# Patient Record
Sex: Female | Born: 1940
Health system: Southern US, Community
[De-identification: ages and names within clinical notes are randomized; demographics above are authoritative.]

## PROBLEM LIST (undated history)

## (undated) DIAGNOSIS — M1990 Unspecified osteoarthritis, unspecified site: Secondary | ICD-10-CM

## (undated) DIAGNOSIS — G2581 Restless legs syndrome: Secondary | ICD-10-CM

## (undated) DIAGNOSIS — E119 Type 2 diabetes mellitus without complications: Secondary | ICD-10-CM

## (undated) DIAGNOSIS — K838 Other specified diseases of biliary tract: Secondary | ICD-10-CM

## (undated) DIAGNOSIS — I10 Essential (primary) hypertension: Secondary | ICD-10-CM

## (undated) DIAGNOSIS — E785 Hyperlipidemia, unspecified: Secondary | ICD-10-CM

## (undated) DIAGNOSIS — K5790 Diverticulosis of intestine, part unspecified, without perforation or abscess without bleeding: Secondary | ICD-10-CM

## (undated) DIAGNOSIS — E7800 Pure hypercholesterolemia, unspecified: Secondary | ICD-10-CM

## (undated) DIAGNOSIS — K219 Gastro-esophageal reflux disease without esophagitis: Secondary | ICD-10-CM

## (undated) HISTORY — DX: Type 2 diabetes mellitus without complications: E11.9

## (undated) HISTORY — DX: Essential (primary) hypertension: I10

## (undated) HISTORY — PX: OTHER SURGICAL HISTORY: SHX169

## (undated) HISTORY — PX: REPLACEMENT TOTAL KNEE: SUR1224

## (undated) HISTORY — DX: Other specified diseases of biliary tract: K83.8

## (undated) HISTORY — DX: Hyperlipidemia, unspecified: E78.5

## (undated) HISTORY — PX: CHOLECYSTECTOMY: SHX55

## (undated) HISTORY — DX: Pure hypercholesterolemia, unspecified: E78.00

---

## 1972-03-30 HISTORY — PX: ERCP W/ SPHICTEROTOMY: SHX1523

## 2003-06-14 ENCOUNTER — Encounter: Payer: Self-pay | Admitting: Orthopedic Surgery

## 2003-10-16 ENCOUNTER — Inpatient Hospital Stay (HOSPITAL_COMMUNITY): Admission: RE | Admit: 2003-10-16 | Discharge: 2003-10-20 | Payer: Self-pay | Admitting: Orthopedic Surgery

## 2003-10-16 ENCOUNTER — Encounter: Payer: Self-pay | Admitting: Orthopedic Surgery

## 2004-04-17 ENCOUNTER — Ambulatory Visit: Payer: Self-pay | Admitting: Orthopedic Surgery

## 2004-10-15 ENCOUNTER — Ambulatory Visit: Payer: Self-pay | Admitting: Orthopedic Surgery

## 2005-10-19 ENCOUNTER — Ambulatory Visit: Payer: Self-pay | Admitting: Orthopedic Surgery

## 2006-10-21 ENCOUNTER — Ambulatory Visit: Payer: Self-pay | Admitting: Orthopedic Surgery

## 2007-10-24 ENCOUNTER — Ambulatory Visit: Payer: Self-pay | Admitting: Orthopedic Surgery

## 2007-10-24 DIAGNOSIS — Z96652 Presence of left artificial knee joint: Secondary | ICD-10-CM | POA: Insufficient documentation

## 2007-10-24 DIAGNOSIS — M25569 Pain in unspecified knee: Secondary | ICD-10-CM | POA: Insufficient documentation

## 2009-11-06 ENCOUNTER — Ambulatory Visit: Payer: Self-pay | Admitting: Orthopedic Surgery

## 2010-04-30 NOTE — Assessment & Plan Note (Signed)
Summary: 2 YR XR RT TKA/MEDICARE/ST BCBS/BSF   Visit Type:  Follow-up  CC:  right knee TKA.  History of Present Illness: I saw Gloria Sanchez in the office today for a followup visit.  She is a 70 years old woman with the complaint of:  right knee.  Xrays today.  DOS 10-16-03. STRYKER SCORPIO  Patient will have an xray today.  aching at times when ther eis a weather change    Allergies: No Known Drug Allergies  Physical Exam  Extremities:  knee flexion arc 125 degrees  full extension    Impression & Recommendations:  Problem # 1:  TOTAL KNEE FOLLOW-UP (ICD-V43.65) Assessment Comment Only  3 xrays of the right knee   The  alignment was normal, PTF reduced without tilt or subluxation, no evidence of loosening.  IMPRESSION: normal appearance of the implant   stable doing well   Orders: Est. Patient Level III (11914) Knee x-ray,  3 views (78295)  Patient Instructions: 1)  Please schedule a follow-up appointment in 1 year. 2)  TKA Xrays

## 2010-07-31 DIAGNOSIS — M545 Low back pain, unspecified: Secondary | ICD-10-CM | POA: Insufficient documentation

## 2010-07-31 DIAGNOSIS — E119 Type 2 diabetes mellitus without complications: Secondary | ICD-10-CM | POA: Insufficient documentation

## 2010-08-15 NOTE — H&P (Signed)
NAME:  Gloria Sanchez, Gloria Sanchez                            ACCOUNT NO.:  [id number]   MEDICAL RECORD NO.:  [id number]                   PATIENT TYPE:  AMB   LOCATION:  DAY                                  FACILITY:  [facility]   PHYSICIAN:  Vickki Hearing, M.D.           DATE OF BIRTH:  1940-04-24   DATE OF ADMISSION:  10/16/2003  DATE OF DISCHARGE:                                HISTORY & PHYSICAL   CHIEF COMPLAINT:  Right knee pain.   HISTORY:  This is a 70 year old female with no known previous known injury,  presented with right knee pain for several years status post one year of  treatment at [facility] for lumbar spine arthritis and  sciatica. She had a MRI of the lumbar spine in June which showed bulging  disks at L3-4 with anterolisthesis of 4 on 5 and disk dehydration seen at L3-  4 and L4-5.   She has catching sensation and giving way with radiation of pain in the back  of the knee. She also pain in the thigh which goes down into the foot.   Her primary symptom, however, is knee pain, and her radiographs indicate  osteoarthritis of the knee. She improved with Neurontin 100 mg t.i.d. and  Sterapred dose pack in terms of her back pain; however, knee pain has  persisted, and a total knee replacement was recommended. The patient  understands that she may have some leg pain from her lumbar spine disease  even after this surgery.   REVIEW OF SYSTEMS:  Poor circulation, vomiting, diarrhea, numbness, unstable  gait, joint pain, arthritis, sinusitis, and tinnitus. All other systems are  normal.   PAST HISTORY:  Allergies none. Medical problems:  Low back pain, chronic  knee pain. Surgery:  Cholecystectomy, removal of kidney stone. Pharmacy [facility].   CURRENT MEDICATIONS:  Lisinopril and Premarin.   FAMILY HISTORY:  Family history of cancer.   FAMILY PHYSICIAN:  Dr. Neita Carp.   SOCIAL HISTORY:  She is married. She is retired on disability. She has no  smoking or alcohol  history. Caffeine use none. Grades completed 1 through  12.   PHYSICAL EXAMINATION:  VITAL SIGNS:  Weight 197, pulse 80, respiratory rate  20.  GENERAL APPEARANCE:  She is well developed, well nourished. Grooming and  hygiene are normal. She has slightly increased weight for height and no  deformity.  CARDIOVASCULAR:  Pulse and perfusion are normal. Extremity temperature  normal. No swelling, no edema, tenderness, or varicose vein.  LYMPH NODES:  Lymph nodes are normal in the cervical spine.  MUSCULOSKELETAL:  Exam of the musculoskeletal system shows abnormal gait  pattern with painful right knee gait. Tenderness is noted in the lumbar  spine. The straight leg raise is now improved from March of 2005. The right  knee shows crepitus on range of motion. She does have 125 degrees of flexion  with full extension. She  has medial joint line tenderness and pain but  negative meniscal signs. Her ligaments are stable. Muscle strength and tone  are normal.  SKIN:  Her skin was normal.  NEUROLOGIC AND PSYCHOLOGIC EXAM :  Showed some decrease sensation in the  dorsum of the right foot but normal reflexes and coordination and normal  mood.   DIAGNOSTIC DATA:  Radiographs show osteoarthritis of the knee.   PLAN:  Right total knee replacement.   Surgery scheduled for July 19 at [facility].     ___________________________________________                                         Vickki Hearing, M.D.   SEH/MEDQ  D:  10/16/2003  T:  10/16/2003  Job:  [id number]

## 2010-08-15 NOTE — Op Note (Signed)
NAME:  Gloria Sanchez, Gloria Sanchez                            ACCOUNT NO.:  [id number]   MEDICAL RECORD NO.:  [id number]                   PATIENT TYPE:  AMB   LOCATION:  DAY                                  FACILITY:  [facility]   PHYSICIAN:  Vickki Hearing, M.D.           DATE OF BIRTH:  1940-05-24   DATE OF PROCEDURE:  10/16/2003  DATE OF DISCHARGE:                                 OPERATIVE REPORT   PREOPERATIVE DIAGNOSIS:  Osteoarthritis, right knee.   POSTOPERATIVE DIAGNOSIS:  Osteoarthritis, right knee.   PROCEDURE:  Right total knee replacement.   COMPONENTS:  Stryker Scorpio posterior stabilized knee, size 7 femur, size 7  tibia, size 7 patella, 12-mm polyethylene insert.   SURGEON:  Vickki Hearing, M.D.   ANESTHESIA:  Spinal.   FINDINGS:  Osteoarthritis mainly medial in patellofemoral compartment.  Several loose bodies in posterior compartment and suprapatellar femoral  compartment.   INDICATIONS FOR PROCEDURE:  The patient is a 70 year old female with  persistent right knee pain.  Also had some back pain.  Was worked up for  that and found to have degenerative disk disease.  Was treated with  Neurontin and Sterapred dose pack which relieved her back and radicular  pain.  She persisted with knee pain and presented for total knee  replacement.  Primary indication was pain.   DESCRIPTION OF PROCEDURE:  Gloria Sanchez was identified in the preoperative  holding area.  Her chart and consent form were reviewed.  She placed a mark  over her right knee as the surgical site, and I signed my initials in the  same site.  She was given Ancef and taken to the operating room for spinal  anesthetic.  She was placed supine, and a sterile prep and drape was done  with DuraPrep.  At that point a timeout was taken.  Everyone agreed with the  procedure.  She had had her antibiotics, and the right lower extremity was  the surgical site.   The leg was exsanguinated with an Engineer, site.  The  tourniquet was  inflated to 300 mmHg where it stayed for 1 hour and 31 minutes.  An incision  was made over the right knee.  The dissection was carried down to the  extensor mechanism, and a medial arthrotomy was performed.  A medial soft  tissue sleeve was elevated.  The patella was everted.  ACL, PCL, and medial  and lateral meniscal anterior horns were resected.  The tibia was subluxated  forward.  A tibial guide was set in neutral position with a 5-degree  posterior slope pinned, and a 10-mm resection was made measuring from the  lateral compartment.  The remaining posterior horns of the medial and  lateral menisci were resected, and the remaining cruciate stumps were  removed.   A three-eighths inch drill bit was inserted into the femoral canal.  The  canal was decompressed by suction  and fluted guide rod, and the 10-mm distal  cutting block was placed.  The 10-mm resection was made.  The extension gap  was checked and found to be tight, and a 2-mm additional cut was made.  This  opened up the flexion gap.   The knee was flexed.  The knee measured a size 7.  The 7 cutting block was  pinned in slight external rotation, and the four cuts were made.  Posterior  bone was removed from both medial and lateral posterior compartments.  We  then made the box cut and set the trials for size 7 femur and tibia, with a  10-mm insert.  At that point, the flexion gap was checked and found to be  good as well.  The medial collateral ligament deep portion was released to  balance the knee in the coronal plane.   The patella was resected from a 26 to a 12.  A size 7 lollipop was placed,  and three drill holes were made through the lollipop.   The knee was irrigated.  The components were cemented in place.  Excess  cement was removed.  A size 10 and 12 trial were placed, and a 12 fit best.  No lateral release was needed.   The knee was irrigated.  Excess cement was removed.  A Hemovac drain was   placed in the deep space.  The extensor mechanism was closed with #1 Bralon.  A pain pump catheter was placed in the subcutaneous space, and the  subcutaneous tissue was closed with 0 and 2-0 Vicryl.  The skin was closed  with staples.  A sterile bandage was placed.  A CryoCuff was placed.  The  tourniquet was released, and the patient was taken to the recovery room in  stable condition.   POSTOPERATIVE PLAN:  Routine postoperative total knee protocol.      ___________________________________________                                            Vickki Hearing, M.D.   SEH/MEDQ  D:  10/16/2003  T:  10/16/2003  Job:  [id number]

## 2010-08-15 NOTE — Discharge Summary (Signed)
NAME:  [name], [name]                            ACCOUNT NO.:  [id number]   MEDICAL RECORD NO.:  [id number]                   PATIENT TYPE:  INP   LOCATION:  A331                                 FACILITY:  [facility]   PHYSICIAN:  Vickki Hearing, M.D.           DATE OF BIRTH:  11/28/1940   DATE OF ADMISSION:  10/16/2003  DATE OF DISCHARGE:  10/20/2003                                 DISCHARGE SUMMARY   ADMISSION DIAGNOSIS:  Osteoarthritis, right knee.   DISCHARGE DIAGNOSIS:  Osteoarthritis, right knee.   PROCEDURE:  Right total knee replacement.   SURGEON:  Vickki Hearing, M.D.   COMPLICATIONS:  None.   HISTORY:  Gloria Sanchez is 70 years old, presented for a right total knee  replacement due to right knee osteoarthritis.  Medical problems included low  back pain and chronic knee pain.  She had a cholecystectomy in the past, as  well as removal of a kidney stone.  Otherwise, he health was known to be  good.    On the day of admission, she underwent an uncomplicated right total knee  replacement using a Striker Scorpio total knee system.  We used a size 7  patella, size 7 12 mm tibia, a standard size 7 tibial tray, 7 right  posterior stabilized femoral component with cement.  She did well under  spinal anesthetic with no complications.   HOSPITAL COURSE:  She had an uncomplicated hospital course, progressed well  with physical therapy, did well in terms of pain.  At the time of discharge,  she had proximally range of motion passively 0 to 90, ambulated about 20 to  30 feet, and was allowed to be discharged to home.   CONDITION ON DISCHARGE:  Overall condition was improved.   DISCHARGE MEDICATIONS:  1. Vicodin 5 mg one q.4h. p.r.n.  2. Lovenox 30 mg subcutaneously q.12h. for the balance of 28 days.  3. __________ one b.i.d.   FOLLOWUP:  Follow up visit on October 25, 2003, with Dr. Romeo Apple.     ___________________________________________       Vickki Hearing, M.D.   SEH/MEDQ  D:  10/30/2003  T:  10/30/2003  Job:  [id number]

## 2010-08-15 NOTE — H&P (Signed)
NAME:  Gloria Sanchez, Gloria Sanchez NO.:  [id number]   MEDICAL RECORD NO.:  [id number]                   PATIENT TYPE:  INP   LOCATION:  A331                                 FACILITY:  [facility]   PHYSICIAN:  Vania Rea, M.D.              DATE OF BIRTH:  1940/08/09   DATE OF ADMISSION:  10/16/2003  DATE OF DISCHARGE:                                HISTORY & PHYSICAL   No dictation for this job.     ___________________________________________                                         Vania Rea, M.D.   LC/MEDQ  D:  10/16/2003  T:  10/16/2003  Job:  [id number]

## 2010-08-15 NOTE — Consult Note (Signed)
NAME:  Gloria Sanchez, STINGER NO.:  [id number]   MEDICAL RECORD NO.:  [id number]                   PATIENT TYPE:  INP   LOCATION:  A331                                 FACILITY:  [facility]   PHYSICIAN:  Vania Rea, M.D.              DATE OF BIRTH:  [birth year]   DATE OF CONSULTATION:  10/16/2003  DATE OF DISCHARGE:                                   CONSULTATION   REQUESTING PHYSICIAN:  Dr. Fuller Canada.   PRIMARY CARE PHYSICIAN:  Dr. Fara Chute.   CONSULTING PHYSICIAN:  Dr. Vania Rea.   REASON FOR CONSULT:  Assistance with medical management of patient who is  status post right knee replacement today.   HISTORY OF PRESENT ILLNESS:  This is a 70 year old African-American lady  with a long history of back pain, sciatica, carpal tunnel syndrome of the  right hand and osteoarthritis of knees, hips and back.  The patient has been  under evaluation by Dr. Romeo Apple after failing therapy at the pain  management clinic in [facility] and was recommended for right knee  replacement today.  Patient is status post the surgery and is now awake,  alert and oriented.   Patient gives a history of chronic hypotension and says sometimes her  systolic blood pressure is in the 300s, but currently her blood pressure has  been fairly well controlled with lisinopril HCTZ.  Patient says she has  occasional headaches from sinusitis but treats herself with over-the-counter  medications.  She has no other acute medical problems.  In the past she has  had episodes of cystitis but has had none recently.   PAST MEDICAL HISTORY:  1. Chronic back pains.  2. Hypertension.  3. Carpal tunnel syndrome.   PAST SURGICAL HISTORY:  1. Status post cholecystectomy.  2. Status post total abdominal hysterectomy in 1978.  3. Status post tonsillectomy as a child.   MEDICATIONS AT HOME:  1. Amitriptyline 25 mg at bedtime.  2. Premarin 0.3 mg daily.  3. Lisinopril/HCT 20/12.5 2  tablets daily.  4. Unclear what she usually uses for pain.   ALLERGIES:  NO KNOWN DRUG ALLERGIES.   SOCIAL HISTORY:  Lives with husband, has 2 grown children, 30 and 35.  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  Has not worked for the past year because of disability.  Has never used tobacco, alcohol or drugs.  Gets regular cancer screening.  Had colonoscopy three years ago, was negative.  Had mammogram last Friday,  was negative.   FAMILY HISTORY:  Her father died in early 4s from lung cancer, he was a  smoker.  Her mother is currently alive at age 64.  She has hypertension.  She has two brothers, two sisters.  One sister deceased from breast cancer,  the other sister with hypertension.  One of the two  brothers had an MI at  age 42 and the other is apparently healthy.  Of her two children one has  hypotension, the other is healthy.   REVIEW OF SYSTEMS:  No history of strokes or syncope or seizures.  Does have  occasional headaches from sinusitis.  Takes over-the-counter medications.  No history of thyroid problems, diabetes or osteoporosis.  No fever, cough  or cold.  No chest pains or shortness of breath.  No nausea, vomiting,  diarrhea or constipation or bloody stool.  Her weight is steady.  Does have  occasional cystitis but none in the recent past.  No hematuria, dysuria or  frequency.  Does have chronic arthritis of the knees, hips and back.  Apparently had an MRI in June, 2005 which showed bulging disk at L3, L4 and  anterior listhesis of 4 and 5.  Disk herniation at L3-4 and also L4-5.   PHYSICAL EXAMINATION:  This is a pleasant and obese African-American lady  lying in bed.  VITALS:  Temperature is 96.8, pulse 78, respirations 20, blood pressure  146/96.  Her height is listed as 5 feet 2-1/2 inches, she weights 200  pounds.  HEENT:  Her pupils are round, equal and reactive.  Her mucous membranes are  moist.  She has full  upper dentures.  She has her own teeth in the lower  jaw.  She has jowls.  NECK:  There is no thyroid problems, no obvious lymphadenopathy.  CHEST:  Clear to auscultation bilaterally.  HEART:  Has a regular rhythm without murmur.  ABDOMEN:  Obese, soft and nontender.  There is no obvious organomegaly.  EXTREMITIES:  Right leg is in a brace and she has a Stryker pump  administering local anesthetic.  The left leg is in a sequential compression  device.  CNS:  She is alert and oriented x3.  There is no obvious focal deficit..   Labs done on October 11, 2003, showed a hematocrit of 36, a white count of 8.8,  and a platelet count of 374. Her INR was 0.9.  Her serum chemistry was  normal with the exception of a glucose slightly elevated at 101.  Her BUN  and creatinine was 11/0.8 and her calcium was 9.6.   Her EKG shows normal sinus rhythm at 74, she has a PR interval of 200.  Looking at the EKG, there seems to be a problem with lead placement, it  reads left anterior fascicular block.   ASSESSMENT:  1. Hypertension, uncontrolled.  2. Pain status post right knee surgery.  Currently receiving rescue doses of     pain medication.  She is on a Dilaudid pump.  3. Degenerative joint disease under management by the orthopedist.  4. Morbid obesity.  Will need some advice on diet, especially in light of     her osteoarthritis and recent knee     replacement.  5. Her primary prevention status seems to be up to date, however we will     advise on her diet and we will titrate her blood pressure medications.      ___________________________________________                                            Vania Rea, M.D.   LC/MEDQ  D:  10/16/2003  T:  10/16/2003  Job:  [id number]   cc:   Fara Chute  [address] [place]  [place] [address]  Fax: [phone]

## 2011-03-04 ENCOUNTER — Encounter: Payer: Self-pay | Admitting: Orthopedic Surgery

## 2011-03-04 ENCOUNTER — Ambulatory Visit (INDEPENDENT_AMBULATORY_CARE_PROVIDER_SITE_OTHER): Payer: Medicare Other | Admitting: Orthopedic Surgery

## 2011-03-04 VITALS — BP 150/80 | Ht 62.5 in | Wt 196.0 lb

## 2011-03-04 DIAGNOSIS — Z96659 Presence of unspecified artificial knee joint: Secondary | ICD-10-CM | POA: Insufficient documentation

## 2011-03-04 NOTE — Patient Instructions (Signed)
Routine activity 

## 2011-03-04 NOTE — Progress Notes (Signed)
Chief complaint total knee follow-up.  History this is a follow-up visit. Status post right  total knee replacement. 2005  Implant DePuy FB SIGMA Review of systems patient has no complaints.  Exam Physical Exam(6) GENERAL: normal development   CDV: pulses are normal   Skin: normal  Psychiatric: awake, alert and oriented  Neuro: normal sensation  1 ambulation no support  2 ROM = 120 3 Motor normal  4 Stability normal   Separate x-ray report.  Reason for x-ray, and we'll x-ray follow-up knee replacement.  3 views right  knee.  The implant is aligned normally. There is no loosening.  Impression normal appearing knee replacement.    Assessment: Knee replacement functioning well    Plan: One year follow

## 2011-05-28 DIAGNOSIS — R079 Chest pain, unspecified: Secondary | ICD-10-CM

## 2012-03-03 ENCOUNTER — Ambulatory Visit: Payer: Medicare Other | Admitting: Orthopedic Surgery

## 2012-03-10 ENCOUNTER — Encounter: Payer: Self-pay | Admitting: Orthopedic Surgery

## 2012-03-10 ENCOUNTER — Ambulatory Visit (INDEPENDENT_AMBULATORY_CARE_PROVIDER_SITE_OTHER): Payer: Medicare Other

## 2012-03-10 ENCOUNTER — Ambulatory Visit (INDEPENDENT_AMBULATORY_CARE_PROVIDER_SITE_OTHER): Payer: Medicare Other | Admitting: Orthopedic Surgery

## 2012-03-10 VITALS — BP 160/78 | Ht 62.5 in | Wt 194.0 lb

## 2012-03-10 DIAGNOSIS — Z96659 Presence of unspecified artificial knee joint: Secondary | ICD-10-CM

## 2012-03-10 NOTE — Patient Instructions (Signed)
activities as tolerated 

## 2012-03-10 NOTE — Progress Notes (Signed)
Patient ID: Gloria Sanchez, female   DOB: [birth year], 71 y.o.   MRN: [id number] Chief Complaint  Patient presents with  . Follow-up    1 year recheck on right knee replacement with xray.    follow-up visit. Status post right  total knee replacement. 2005  Implant DePuy FB SIGMA Review of systems patient has no complaints.  Physical Exam GENERAL: normal development   CDV: pulses are normal   Skin: normal  Lymph: nodes were not palpable/normal  Psychiatric: awake, alert and oriented  Neuro: normal sensation  Right knee: ROM = 115 DEGREES  INCISION HAS HEALED WELL   XRAYS SHOW NO LOOSENING   1 YR F/U XRAYS

## 2012-06-28 DIAGNOSIS — B372 Candidiasis of skin and nail: Secondary | ICD-10-CM | POA: Insufficient documentation

## 2013-03-14 ENCOUNTER — Ambulatory Visit (INDEPENDENT_AMBULATORY_CARE_PROVIDER_SITE_OTHER): Payer: Medicare Other

## 2013-03-14 ENCOUNTER — Ambulatory Visit (INDEPENDENT_AMBULATORY_CARE_PROVIDER_SITE_OTHER): Payer: Medicare Other | Admitting: Orthopedic Surgery

## 2013-03-14 ENCOUNTER — Encounter: Payer: Self-pay | Admitting: Orthopedic Surgery

## 2013-03-14 VITALS — Ht 62.5 in | Wt 203.0 lb

## 2013-03-14 DIAGNOSIS — M7051 Other bursitis of knee, right knee: Secondary | ICD-10-CM

## 2013-03-14 DIAGNOSIS — Z96651 Presence of right artificial knee joint: Secondary | ICD-10-CM

## 2013-03-14 DIAGNOSIS — Z96659 Presence of unspecified artificial knee joint: Secondary | ICD-10-CM

## 2013-03-14 DIAGNOSIS — M76899 Other specified enthesopathies of unspecified lower limb, excluding foot: Secondary | ICD-10-CM

## 2013-03-14 NOTE — Patient Instructions (Signed)
Ice your knee 3 times a day  Apply Biofreeze 3 times a day

## 2013-03-14 NOTE — Progress Notes (Signed)
Patient ID: Gloria Sanchez, female   DOB: 23-Jun-1940, 72 y.o.   MRN: [id number]  Chief Complaint  Patient presents with  . Follow-up    Yearly recheck right knee TKA 2005     History this is the  67  Yr annual followup status post right knee replacement  The patient complains of 1 day lateral pain right knee  The patient's function with normal activities is excellent   Review of systems musculoskeletal the patient denies catching locking or giving way in the knee    General appearance is normal, the patient is alert and oriented x3 with normal mood and affect. Knee flexion is  115 The knee is stable in the anterior posterior and medial lateral plane There is tenderness and swelling over the right lateral joint line it iliotibial band Status post right total knee  A followup x-ray will be performed at one year from now  Also inject right knee for iliotibial band tendinitis  At this bio freeze 3 times a day ice 3 times a day followup in a year for repeat radiograph at 10 years  Knee  Injection Procedure Note  Pre-operative Diagnosis: right knee iliotibial band bursitis Post-operative Diagnosis: same  Indications: pain  Anesthesia: ethyl chloride   Procedure Details   Verbal consent was obtained for the procedure. Time out was completed.The point of maximal tenderness over the iliotibial band was prepped with alcohol, followed by  Ethyl chloride spray and A 20 gauge needle was inserted into area of maximal tenderness; 4ml 1% lidocaine and 1 ml of depomedrol  was then injected into the joint . The needle was removed and the area cleansed and dressed.  Complications:  None; patient tolerated the procedure well.

## 2013-05-24 DIAGNOSIS — K2100 Gastro-esophageal reflux disease with esophagitis, without bleeding: Secondary | ICD-10-CM | POA: Insufficient documentation

## 2013-09-21 DIAGNOSIS — G2589 Other specified extrapyramidal and movement disorders: Secondary | ICD-10-CM | POA: Insufficient documentation

## 2014-03-12 ENCOUNTER — Ambulatory Visit (INDEPENDENT_AMBULATORY_CARE_PROVIDER_SITE_OTHER): Payer: Medicare Other | Admitting: Orthopedic Surgery

## 2014-03-12 ENCOUNTER — Ambulatory Visit (INDEPENDENT_AMBULATORY_CARE_PROVIDER_SITE_OTHER): Payer: Medicare Other

## 2014-03-12 ENCOUNTER — Encounter: Payer: Self-pay | Admitting: Orthopedic Surgery

## 2014-03-12 VITALS — BP 162/87 | Ht 62.5 in | Wt 196.0 lb

## 2014-03-12 DIAGNOSIS — Z96651 Presence of right artificial knee joint: Secondary | ICD-10-CM

## 2014-03-12 NOTE — Progress Notes (Signed)
Patient ID: Gloria AlasMary F Sanchez Gloria PasseyBrown, female   DOB: Jan 05, 1941, 73 y.o.   MRN: [id number] Chief Complaint  Patient presents with  . Follow-up    annual recheck and xray right knee TKA DOS 2005    Encounter Diagnosis  Name Primary?  . Status post total right knee replacement Yes    BP 162/87 mmHg  Ht 5' 2.5" (1.588 m)  Wt 196 lb (88.905 kg)  BMI 35.26 kg/m2  Continue postop Stryker total knee arthroplasty no complaints  Review of systems left knee pain  Exam shows normal ambulation assistant devices numbness or swelling skin incision looks nice range of motion 120 knee stable motor exam normal, neurovascular exam normal  X-ray show stable prosthesis without loosening  Follow-up one year

## 2014-03-15 ENCOUNTER — Ambulatory Visit: Payer: Medicare Other | Admitting: Orthopedic Surgery

## 2015-03-14 ENCOUNTER — Ambulatory Visit: Payer: Medicare Other | Admitting: Orthopedic Surgery

## 2015-03-28 ENCOUNTER — Ambulatory Visit (INDEPENDENT_AMBULATORY_CARE_PROVIDER_SITE_OTHER): Payer: Medicare Other | Admitting: Orthopedic Surgery

## 2015-03-28 ENCOUNTER — Ambulatory Visit (INDEPENDENT_AMBULATORY_CARE_PROVIDER_SITE_OTHER): Payer: Medicare Other

## 2015-03-28 VITALS — BP 160/97 | Ht 62.5 in | Wt 195.0 lb

## 2015-03-28 DIAGNOSIS — M129 Arthropathy, unspecified: Secondary | ICD-10-CM | POA: Diagnosis not present

## 2015-03-28 DIAGNOSIS — M1710 Unilateral primary osteoarthritis, unspecified knee: Secondary | ICD-10-CM

## 2015-03-28 DIAGNOSIS — Z96651 Presence of right artificial knee joint: Secondary | ICD-10-CM | POA: Diagnosis not present

## 2015-03-28 NOTE — Progress Notes (Signed)
Patient ID: Gloria Sanchez, female   DOB: 1940/07/09, 74 y.o.   MRN: [id number]  Post op annual TKA   Chief Complaint  Patient presents with  . Follow-up    Yearly recheck on right knee replacement with xray, DOS 2005.    HPI Gloria Sanchez is a 74 y.o. female.  Routine tka f/u  No pain normal function     ROS left knee pain    Past Medical History  Diagnosis Date  . Allergic rhinitis   . Hyperlipidemia   . Hypertension   . DM (diabetes mellitus)     No Known Allergies  Current Outpatient Prescriptions  Medication Sig Dispense Refill  . cetirizine (ZYRTEC) 10 MG tablet Take 10 mg by mouth daily.      Marland Kitchen. lisinopril-hydrochlorothiazide (PRINZIDE,ZESTORETIC) 20-12.5 MG per tablet Take 1 tablet by mouth daily.      Marland Kitchen. losartan-hydrochlorothiazide (HYZAAR) 50-12.5 MG per tablet Take 1 tablet by mouth daily.    . metFORMIN (GLUMETZA) 500 MG (MOD) 24 hr tablet Take 500 mg by mouth daily with breakfast.      . simvastatin (ZOCOR) 20 MG tablet Take 20 mg by mouth at bedtime.       No current facility-administered medications for this visit.    Review of Systems Review of Systems As above   Physical Exam Blood pressure 160/97, height 5' 2.5" (1.588 m), weight 195 lb (88.451 kg).   Gen. appearance is normal there are no congenital abnormalities   The patient is oriented 3   Mood and affect are normal   Ambulation is without assistive device   Knee inspection reveals a well-healed incision with no swelling   Knee flexion 120  Stability in the anteroposterior plane is normal as well as in the medial lateral plane  Motor exam reveals full extension without extensor lag   Data Reviewed KNEE XRAYS : normal   Assessment S/P TKA   Plan    Fu 1 yr repeat the xrays         Fuller CanadaStanley Christol Thetford 03/28/2015, 12:45 PM

## 2015-05-20 ENCOUNTER — Encounter: Payer: Self-pay | Admitting: Orthopedic Surgery

## 2015-05-20 ENCOUNTER — Ambulatory Visit (INDEPENDENT_AMBULATORY_CARE_PROVIDER_SITE_OTHER): Payer: Medicare Other | Admitting: Orthopedic Surgery

## 2015-05-20 VITALS — BP 135/75 | Ht 62.5 in | Wt 194.0 lb

## 2015-05-20 DIAGNOSIS — S8001XA Contusion of right knee, initial encounter: Secondary | ICD-10-CM | POA: Diagnosis not present

## 2015-05-20 NOTE — Progress Notes (Signed)
Patient ID: Gloria Sanchez, female   DOB: 18-Jul-1940, 75 y.o.   MRN: [id number]  Chief Complaint  Patient presents with  . Knee Pain    Left knee pain s/p fall, referred by Dr Neita Carp    HPI Gloria Sanchez is a 75 y.o. female.   Chief Complaint  Patient presents with  . Knee Pain    Left knee pain s/p fall, referred by Dr Neita Carp     Review of Systems Review of Systems  Past Medical History  Diagnosis Date  . Allergic rhinitis   . Hyperlipidemia   . Hypertension   . DM (diabetes mellitus) (HCC)     No past surgical history on file.  Family History  Problem Relation Age of Onset  . Cancer Sister   . Diabetes Sister     Social History Social History  Substance Use Topics  . Smoking status: Never Smoker   . Smokeless tobacco: None  . Alcohol Use: No    No Known Allergies  Current Outpatient Prescriptions  Medication Sig Dispense Refill  . cetirizine (ZYRTEC) 10 MG tablet Take 10 mg by mouth daily.      Marland Kitchen lisinopril-hydrochlorothiazide (PRINZIDE,ZESTORETIC) 20-12.5 MG per tablet Take 1 tablet by mouth daily.      Marland Kitchen losartan-hydrochlorothiazide (HYZAAR) 50-12.5 MG per tablet Take 1 tablet by mouth daily.    . metFORMIN (GLUMETZA) 500 MG (MOD) 24 hr tablet Take 500 mg by mouth daily with breakfast.      . simvastatin (ZOCOR) 20 MG tablet Take 20 mg by mouth at bedtime.       No current facility-administered medications for this visit.       Physical Exam Physical Exam Height 5' 2.5" (1.588 m). Appearance, there are no abnormalities in terms of appearance the patient was well-developed and well-nourished. The grooming and hygiene were normal.  Mental status orientation, there was normal alertness and orientation Mood pleasant Ambulatory status normal with no assistive devices  Examination of the left knee  Inspection mild tenderness medial joint line no effusion Range of motion flexion ARC 120 Tests for stability collateral ligaments and  cruciate ligament stable Motor strength  grade 5 strength in the extensor mechanism Skin warm dry and intact without laceration or ulceration or erythema Neurologic examination normal sensation Vascular examination normal pulses with warm extremity and normal capillary refill  The opposite extremity right knee reveals no swelling or tenderness in stability and strength tests were normal  Back was nontender in the left side but mild midline tenderness which is chronic for her   Data Reviewed Films are on a disc and a report is not included   I find the following severe arthritis medial compartment. There were 4 views. The patella was centered and with minimal arthritis. Primarily arthritis in the medial compartment, secondary bone changes including sclerosis of the joint line no cyst formation but osteophytes were noted.  Assessment  Probable contusion left knee with exacerbation of underlying arthritis   Plan  Economy hinged knee brace with over-the-counter Tylenol or Advil as needed continue cane use follow-up in a month

## 2015-05-20 NOTE — Patient Instructions (Signed)
Wear brace for activity Use cane Tylenol or Advil as needed

## 2015-05-20 NOTE — Addendum Note (Signed)
Addended by: Vickki Hearing on: 05/20/2015 01:56 PM   Modules accepted: Kipp Brood

## 2015-06-17 ENCOUNTER — Ambulatory Visit (INDEPENDENT_AMBULATORY_CARE_PROVIDER_SITE_OTHER): Payer: Medicare Other | Admitting: Orthopedic Surgery

## 2015-06-17 ENCOUNTER — Encounter: Payer: Self-pay | Admitting: Orthopedic Surgery

## 2015-06-17 VITALS — BP 143/81 | Ht 62.5 in | Wt 194.0 lb

## 2015-06-17 DIAGNOSIS — M7552 Bursitis of left shoulder: Secondary | ICD-10-CM

## 2015-06-17 DIAGNOSIS — S8002XA Contusion of left knee, initial encounter: Secondary | ICD-10-CM | POA: Diagnosis not present

## 2015-06-17 DIAGNOSIS — M25511 Pain in right shoulder: Secondary | ICD-10-CM | POA: Diagnosis not present

## 2015-06-17 DIAGNOSIS — M25512 Pain in left shoulder: Secondary | ICD-10-CM

## 2015-06-17 DIAGNOSIS — M545 Low back pain, unspecified: Secondary | ICD-10-CM

## 2015-06-17 NOTE — Patient Instructions (Signed)
Call [facility] outpatient therapy dept to schedule therapy visits 

## 2015-06-17 NOTE — Progress Notes (Signed)
Chief Complaint  Patient presents with  . Follow-up    1 month follow up left knee   HPI follow-up status post injection left knee after falling with contusion and she complains of weakness today but not pain.  She complains of bilateral shoulder pain bilateral loss of motion bilateral difficulty getting her arm overhead and does not report any trauma   ROS History of lumbar spine disease complains of chronic back pain which she says she is used to  She had numbness of her lower lip and numbness and tingling in her left arm over the weekend and we are going to get her to see Dr. Neita CarpSasser follow-up on that   Past Medical History  Diagnosis Date  . Allergic rhinitis   . Hyperlipidemia   . Hypertension   . DM (diabetes mellitus) (HCC)     No past surgical history on file. Family History  Problem Relation Age of Onset  . Cancer Sister   . Diabetes Sister    Social History  Substance Use Topics  . Smoking status: Never Smoker   . Smokeless tobacco: None  . Alcohol Use: No    Current outpatient prescriptions:  .  amLODipine (NORVASC) 5 MG tablet, Take 5 mg by mouth daily., Disp: , Rfl:  .  clonazePAM (KLONOPIN) 0.5 MG tablet, Take 0.5 mg by mouth 2 (two) times daily as needed for anxiety., Disp: , Rfl:  .  losartan-hydrochlorothiazide (HYZAAR) 100-25 MG tablet, Take 1 tablet by mouth daily., Disp: , Rfl:  .  metFORMIN (GLUMETZA) 500 MG (MOD) 24 hr tablet, Take 500 mg by mouth daily with breakfast.  , Disp: , Rfl:  .  nabumetone (RELAFEN) 500 MG tablet, Take 500 mg by mouth daily., Disp: , Rfl:  .  simvastatin (ZOCOR) 20 MG tablet, Take 20 mg by mouth at bedtime.  , Disp: , Rfl:   BP 143/81 mmHg  Ht 5' 2.5" (1.588 m)  Wt 194 lb (87.998 kg)  BMI 34.90 kg/m2  Physical Exam  Constitutional: She is oriented to person, place, and time. She appears well-developed and well-nourished. No distress.  Cardiovascular: Normal rate and intact distal pulses.   Neurological: She is alert  and oriented to person, place, and time. She has normal reflexes. She exhibits normal muscle tone.  Skin: Skin is warm and dry. No rash noted. She is not diaphoretic. No erythema. No pallor.  Psychiatric: She has a normal mood and affect. Her behavior is normal. Judgment and thought content normal.    Ortho Exam  Left knee strength is normal flexion is normal tenderness mild no effusion left hip flexion strength is normal  She is tender in her lumbar spine on the right and the left  Right and left shoulder Bilateral shoulder tenderness, flexion in the scapular plane 100 in each shoulder with painful range of motion. No instability. Mild rotator cuff weakness.   ASSESSMENT: Chronic back pain with left leg weakness Left knee contusion secondary to trauma Bilateral shoulder pain bilateral shoulder bursitis  PLAN Physical therapy for lumbar spine 3 times a week 1 month  Inject both shoulders  Return in 2 months  Procedure note the subacromial injection shoulder left   Verbal consent was obtained to inject the  Left   Shoulder  Timeout was completed to confirm the injection site is a subacromial space of the  left  shoulder  Medication used Depo-Medrol 40 mg and lidocaine 1% 3 cc  Anesthesia was provided by ethyl chloride  The  injection was performed in the left  posterior subacromial space. After pinning the skin with alcohol and anesthetized the skin with ethyl chloride the subacromial space was injected using a 20-gauge needle. There were no complications  Sterile dressing was applied.    Procedure note the subacromial injection shoulder RIGHT  Verbal consent was obtained to inject the  RIGHT   Shoulder  Timeout was completed to confirm the injection site is a subacromial space of the  RIGHT  shoulder   Medication used Depo-Medrol 40 mg and lidocaine 1% 3 cc  Anesthesia was provided by ethyl chloride  The injection was performed in the RIGHT  posterior subacromial  space. After pinning the skin with alcohol and anesthetized the skin with ethyl chloride the subacromial space was injected using a 20-gauge needle. There were no complications  Sterile dressing was applied.

## 2015-08-12 ENCOUNTER — Ambulatory Visit (INDEPENDENT_AMBULATORY_CARE_PROVIDER_SITE_OTHER): Payer: Medicare Other | Admitting: Orthopedic Surgery

## 2015-08-12 ENCOUNTER — Encounter: Payer: Self-pay | Admitting: Orthopedic Surgery

## 2015-08-12 VITALS — BP 146/74 | HR 76 | Ht 62.5 in | Wt 194.0 lb

## 2015-08-12 DIAGNOSIS — M545 Low back pain, unspecified: Secondary | ICD-10-CM

## 2015-08-12 DIAGNOSIS — Z96651 Presence of right artificial knee joint: Secondary | ICD-10-CM

## 2015-08-12 DIAGNOSIS — M25511 Pain in right shoulder: Secondary | ICD-10-CM

## 2015-08-12 DIAGNOSIS — S8001XA Contusion of right knee, initial encounter: Secondary | ICD-10-CM

## 2015-08-12 DIAGNOSIS — M25512 Pain in left shoulder: Secondary | ICD-10-CM

## 2015-08-12 NOTE — Progress Notes (Signed)
Patient ID: Gloria Sanchez, female   DOB: 1940-07-01, 75 y.o.   MRN: [id number]  Chief Complaint  Patient presents with  . Follow-up    Follow up bilatereal shoulders, back and Left knee   Encounter Diagnoses  Name Primary?  . Bilateral shoulder pain Yes  . Status post total right knee replacement   . History of knee replacement, total, right   . Knee contusion, right, initial encounter   . Bilateral low back pain without sciatica     HPI 75 year old history of bilateral shoulder pain had a right total knee had a left knee contusion presents for follow-up after physical therapy and injections full shoulder with basically improvement in her back as long as she uses heat. Shoulders improved with therapy with continued weakness right shoulder. No neck pain complains of shoulder pain on the right none on the left back pain minimal knee pain left minimal  ROS no fever chills noted. No neurologic changes such as decreased sensation. No skin rashes. No erythema or effusions.  BP 146/74 mmHg  Pulse 76  Ht 5' 2.5" (1.588 m)  Wt 194 lb (87.998 kg)  BMI 34.90 kg/m2  Physical Exam Physical Exam  Constitutional: The patient is oriented to person, place, and time. The patient appears well-developed and well-nourished. No distress.  Cardiovascular: Intact distal pulses.   Neurological: The patient is alert and oriented to person, place, and time. The patient exhibits normal muscle tone. Coordination normal.  Skin: Skin is warm and dry. No rash noted. The patient is not diaphoretic. No erythema. No pallor.  Psychiatric: The patient has a normal mood and affect. Her behavior is normal. Judgment and thought content normal.   Gait is normal   Ortho Exam  WEAK RIGHT SHOULDER ROTATOR CUFF Passive range of motion approximately 125 active 90 Normal nerve function in the right arm normal pulse in the right wrist Normal axillary and epitrochlear lymph nodes in the right arm  BACK BETTER WITH  HEAT AND PT  LEFT SHOULDER IS STRONG 5/5 MMT Left shoulder 150 range of motion Nerve function normal left hand wrist, normal pulse left wrist Normal lymph nodes axilla and epitrochlear region   Right knee stable  Left knee pain is stable at this time   ASSESSMENT AND PLAN   Encounter Diagnoses  Name Primary?  . Bilateral shoulder pain Yes  . Status post total right knee replacement   . History of knee replacement, total, right   . Knee contusion, right, initial encounter   . Bilateral low back pain without sciatica    Return in 3 months patient did not want any intervention at this time

## 2015-11-11 ENCOUNTER — Encounter: Payer: Self-pay | Admitting: Orthopedic Surgery

## 2015-11-11 ENCOUNTER — Ambulatory Visit (INDEPENDENT_AMBULATORY_CARE_PROVIDER_SITE_OTHER): Payer: Medicare Other | Admitting: Orthopedic Surgery

## 2015-11-11 VITALS — BP 155/79 | Ht 61.0 in | Wt 198.2 lb

## 2015-11-11 DIAGNOSIS — S8002XD Contusion of left knee, subsequent encounter: Secondary | ICD-10-CM | POA: Diagnosis not present

## 2015-11-11 DIAGNOSIS — M25511 Pain in right shoulder: Secondary | ICD-10-CM

## 2015-11-11 DIAGNOSIS — M25512 Pain in left shoulder: Secondary | ICD-10-CM

## 2015-11-11 DIAGNOSIS — M545 Low back pain, unspecified: Secondary | ICD-10-CM

## 2015-11-11 DIAGNOSIS — Z96651 Presence of right artificial knee joint: Secondary | ICD-10-CM

## 2015-11-11 MED ORDER — GABAPENTIN 300 MG PO CAPS: 300.0000 mg | ORAL_CAPSULE | Freq: Every day | ORAL | 5 refills | Status: DC

## 2015-11-11 NOTE — Patient Instructions (Addendum)
You have received an injection of steroids into the joint. 15% of patients will have increased pain within the 24 hours postinjection.   This is transient and will go away.   We recommend that you use ice packs on the injection site for 20 minutes every 2 hours and extra strength Tylenol 2 tablets every 8 as needed until the pain resolves.  If you continue to have pain after taking the Tylenol and using the ice please call the office for further instructions.   MRI L SPINE   START GABAPENTIN 300 MG AT NIGHT   Encounter Diagnoses  Name Primary?  . Bilateral shoulder pain   . Status post total right knee replacement   . Knee contusion, left, subsequent encounter Yes  . Bilateral low back pain without sciatica

## 2015-11-11 NOTE — Progress Notes (Signed)
Patient ID: Gloria Sanchez, female   DOB: 25-Apr-1940, 75 y.o.   MRN: [id number]  Chief Complaint  Patient presents with  . Follow-up    left knee and bilateral shoulder pain   Encounter Diagnoses  Name Primary?  . Bilateral shoulder pain   . Status post total right knee replacement   . Knee contusion, left, subsequent encounter Yes  . Bilateral low back pain without sciatica     HPI Gloria Sanchez is a 75 y.o. female.  Comes in for follow-up with the above complaints  As far as the shoulder scope she's not ready to address those at this time it seemed to be under control  The right knee is functioning well.  Left knee is starting to bother her more with increased pain and giving way.  Her bilateral lower back pain is now associated with some pain in the back of her thigh knee and calf with increased pain at night  Review of Systems Review of Systems 1. No red flags such as fever chills night sweats unintended weight loss bowel bladder dysfunction 2. No progressive weakness or history of cancer   Past Medical History:  Diagnosis Date  . Allergic rhinitis   . DM (diabetes mellitus) (HCC)   . Hyperlipidemia   . Hypertension       surgery   right total knee arthroplasty  Social History Social History  Substance Use Topics  . Smoking status: Never Smoker  . Smokeless tobacco: Not on file  . Alcohol use No    No Known Allergies  Current Outpatient Prescriptions  Medication Sig Dispense Refill  . amLODipine (NORVASC) 5 MG tablet Take 5 mg by mouth daily.    . clonazePAM (KLONOPIN) 0.5 MG tablet Take 0.5 mg by mouth 2 (two) times daily as needed for anxiety.    Marland Kitchen. losartan-hydrochlorothiazide (HYZAAR) 100-25 MG tablet Take 1 tablet by mouth daily.    . metFORMIN (GLUMETZA) 500 MG (MOD) 24 hr tablet Take 500 mg by mouth daily with breakfast.      . simvastatin (ZOCOR) 20 MG tablet Take 20 mg by mouth at bedtime.       No current facility-administered  medications for this visit.       Physical Exam Physical Exam BP (!) 155/79   Ht 5\' 1"  (1.549 m)   Wt 198 lb 3.2 oz (89.9 kg)   BMI 37.45 kg/m   Gen.Normal grooming and hygiene The patient is alert and oriented person place and time Mood is normal affect is normal Ambulatory stsupported by a cane  Exam of thleft knee  Inspection Crepitation and tenderness in the peripatellar region and on range of motion Flexion equals 120 Normal stability Strength  normal strength in the quadriceps  Skin:  no lesions on the skin  Pulses:  no peripheral edema  Neuro:  normal sensation  tenderness lateral compartment of the lower leg posterior calf posterior lower leg posterior thigh straight leg raise positive at 35 on the left leg contralateral straight leg raise normal     Data Reviewed Prior notes and prior imaging  Assessment    Encounter Diagnoses  Name Primary?  . Bilateral shoulder pain   . Status post total right knee replacement   . Knee contusion, left, subsequent encounter Yes  . Bilateral low back pain without sciatica     Exacerbation of lower back with lower leg sciatica at this time  Worsening pain left knee    Plan  Inject left knee Procedure note left knee injection verbal consent was obtained to inject left knee joint  Timeout was completed to confirm the site of injection  The medications used were 40 mg of Depo-Medrol and 1% lidocaine 3 cc  Anesthesia was provided by ethyl chloride and the skin was prepped with alcohol.  After cleaning the skin with alcohol a 20-gauge needle was used to inject the left knee joint. There were no complications. A sterile bandage was applied.  MRI lumbar spine to assess neural elements for possible epidural injection start gabapentin 300 mg daily at bedtime  Follow-up after MRI       Fuller CanadaStanley Harrison 11/11/2015, 9:08 AM

## 2015-11-22 ENCOUNTER — Ambulatory Visit (HOSPITAL_COMMUNITY)
Admission: RE | Admit: 2015-11-22 | Discharge: 2015-11-22 | Disposition: A | Discharge status: Home / Self Care | Payer: Medicare Other | Source: Ambulatory Visit | Attending: Orthopedic Surgery | Admitting: Orthopedic Surgery

## 2015-11-22 DIAGNOSIS — M4806 Spinal stenosis, lumbar region: Secondary | ICD-10-CM | POA: Insufficient documentation

## 2015-11-22 DIAGNOSIS — M545 Low back pain, unspecified: Secondary | ICD-10-CM

## 2015-11-24 DIAGNOSIS — E669 Obesity, unspecified: Secondary | ICD-10-CM | POA: Insufficient documentation

## 2015-11-25 ENCOUNTER — Ambulatory Visit: Payer: Medicare Other | Admitting: Orthopedic Surgery

## 2015-12-03 ENCOUNTER — Ambulatory Visit (INDEPENDENT_AMBULATORY_CARE_PROVIDER_SITE_OTHER): Payer: Medicare Other | Admitting: Orthopedic Surgery

## 2015-12-03 ENCOUNTER — Encounter: Payer: Self-pay | Admitting: Orthopedic Surgery

## 2015-12-03 VITALS — BP 147/69 | HR 73 | Ht 61.0 in | Wt 196.0 lb

## 2015-12-03 DIAGNOSIS — M48061 Spinal stenosis, lumbar region without neurogenic claudication: Secondary | ICD-10-CM

## 2015-12-03 DIAGNOSIS — M4806 Spinal stenosis, lumbar region: Secondary | ICD-10-CM | POA: Diagnosis not present

## 2015-12-03 NOTE — Patient Instructions (Signed)
[facility] are being referred to [facility]. Expect a call from them.

## 2015-12-03 NOTE — Progress Notes (Signed)
Patient ID: Gloria BoucheMary F Scales Sanchez, female   DOB: [birth year], 75 y.o.   MRN: [id number]  Chief Complaint  Patient presents with  . Results    MRI L Spine    HPI Gloria Sanchez is a 75 y.o. female.  MRI follow-up lumbar spine HPI  Lumbar spine pain leg pain as well. We started gabapentin 3 mg at bedtime pain MRI to assess neural elements for possible epidural injection    Review of Systems Review of Systems  Bowel and bladder function remain intact   Physical Exam BP (!) 147/69   Pulse 73   Wt 196 lb (88.9 kg)   BMI 37.03 kg/m    This is the MRI report and after review of the actual images I agree that there is some spondylosis and spinal stenosis moderate to severe   L4-5: The disc is uncovered with a shallow bulge. Advanced facet degenerative change is seen with ligamentum flavum thickening. Moderate to moderately severe central canal stenosis is identified. The left foramen is open. Moderate to moderately severe right foraminal narrowing is identified.   L5-S1: Advanced bilateral facet degenerative change is seen with ligamentum flavum thickening and a shallow disc bulge. There is mild central canal narrowing. The foramina are open.   IMPRESSION: Spondylosis as described above appears worst at L4-5 where advanced facet degenerative change results in 0.6 cm anterolisthesis and there is moderate to moderately severe central canal stenosis. Moderate to moderately severe right foraminal narrowing is also seen at this level. Please see above for description is of individual levels.     Electronically Signed   By: Drusilla Kannerhomas  Dalessio M.D.   On: 11/22/2015 09:17   Recommend neurosurgical follow-up evaluation and treat

## 2016-01-02 DIAGNOSIS — M5137 Other intervertebral disc degeneration, lumbosacral region: Secondary | ICD-10-CM | POA: Insufficient documentation

## 2016-01-02 DIAGNOSIS — M4310 Spondylolisthesis, site unspecified: Secondary | ICD-10-CM | POA: Insufficient documentation

## 2016-01-16 DIAGNOSIS — M719 Bursopathy, unspecified: Secondary | ICD-10-CM

## 2016-01-16 DIAGNOSIS — N183 Chronic kidney disease, stage 3 unspecified: Secondary | ICD-10-CM | POA: Insufficient documentation

## 2016-01-16 DIAGNOSIS — M67919 Unspecified disorder of synovium and tendon, unspecified shoulder: Secondary | ICD-10-CM | POA: Insufficient documentation

## 2016-01-29 DIAGNOSIS — M48061 Spinal stenosis, lumbar region without neurogenic claudication: Secondary | ICD-10-CM | POA: Insufficient documentation

## 2016-03-04 ENCOUNTER — Encounter: Payer: Self-pay | Admitting: Orthopedic Surgery

## 2016-04-02 ENCOUNTER — Ambulatory Visit: Payer: Medicare Other | Admitting: Orthopedic Surgery

## 2016-04-06 ENCOUNTER — Ambulatory Visit: Payer: Medicare Other | Admitting: Orthopedic Surgery

## 2016-04-13 ENCOUNTER — Encounter: Payer: Self-pay | Admitting: Orthopedic Surgery

## 2016-04-13 ENCOUNTER — Ambulatory Visit (INDEPENDENT_AMBULATORY_CARE_PROVIDER_SITE_OTHER): Payer: Medicare Other | Admitting: Orthopedic Surgery

## 2016-04-13 ENCOUNTER — Ambulatory Visit (INDEPENDENT_AMBULATORY_CARE_PROVIDER_SITE_OTHER): Payer: Medicare Other

## 2016-04-13 DIAGNOSIS — M1710 Unilateral primary osteoarthritis, unspecified knee: Secondary | ICD-10-CM

## 2016-04-13 DIAGNOSIS — Z96651 Presence of right artificial knee joint: Secondary | ICD-10-CM | POA: Diagnosis not present

## 2016-04-13 DIAGNOSIS — M545 Low back pain: Secondary | ICD-10-CM | POA: Diagnosis not present

## 2016-04-13 DIAGNOSIS — M7551 Bursitis of right shoulder: Secondary | ICD-10-CM

## 2016-04-13 DIAGNOSIS — G8929 Other chronic pain: Secondary | ICD-10-CM

## 2016-04-13 NOTE — Progress Notes (Signed)
Patient for annual total knee follow-up done back in 2005 with right total knee arthroplasty  She also has a history of spinal stenosis  Review of systems   Patient seems to be doing a little bit worse with her lower back situation on the right side despite epidural did help the left side  She complains of right shoulder pain and loss of motion  She complains of left knee pain  Neurologic: Normal sensation right and left upper extremities   Social history her husband is ill she needs a left total knee she can have it until he gets better  Past Medical History:  Diagnosis Date  . Allergic rhinitis   . DM (diabetes mellitus) (HCC)   . Hyperlipidemia   . Hypertension     Gen. appearance is normal she is ambulatory with a cane she is oriented 3 her mood is pleasant her affect is flat her gait is labored. Her right shoulder is tender in the right rotator cuff area with decreased flexion extension and external rotation but no instability cuff strength is normal and internal/external rotation skin right shoulder is normal without rash or erythema and no warmth pulse and perfusion are normal in the right upper extremity axillary lymph nodes are negative sensation in the right hand is normal   Inspection right knee incision well-healed, range of motion 120, knee stability normal in the coronal and sagittal plane, strength in the extensor mechanism is normal. She does have nontender knee to palpation.  Plain films right knee show normal right total knee without loosening   Encounter Diagnoses  Name Primary?  Marland Kitchen. Arthritis of knee Yes  . Status post total right knee replacement   . Chronic bilateral low back pain without sciatica   . Bursitis of right shoulder      Plan continue annual follow-ups for the right knee   Procedure note the subacromial injection shoulder RIGHT  Verbal consent was obtained to inject the  RIGHT   Shoulder  Timeout was completed to confirm the injection  site is a subacromial space of the  RIGHT  shoulder   Medication used Depo-Medrol 40 mg and lidocaine 1% 3 cc  Anesthesia was provided by ethyl chloride  The injection was performed in the RIGHT  posterior subacromial space. After pinning the skin with alcohol and anesthetized the skin with ethyl chloride the subacromial space was injected using a 20-gauge needle. There were no complications  Sterile dressing was applied.  Follow as needed

## 2016-04-13 NOTE — Patient Instructions (Signed)

## 2016-05-03 DIAGNOSIS — K529 Noninfective gastroenteritis and colitis, unspecified: Secondary | ICD-10-CM | POA: Insufficient documentation

## 2016-05-13 ENCOUNTER — Encounter (INDEPENDENT_AMBULATORY_CARE_PROVIDER_SITE_OTHER): Payer: Self-pay | Admitting: Internal Medicine

## 2016-05-20 ENCOUNTER — Ambulatory Visit (INDEPENDENT_AMBULATORY_CARE_PROVIDER_SITE_OTHER): Payer: Medicare Other | Admitting: Internal Medicine

## 2016-05-22 ENCOUNTER — Encounter (INDEPENDENT_AMBULATORY_CARE_PROVIDER_SITE_OTHER): Payer: Self-pay | Admitting: Internal Medicine

## 2016-05-22 ENCOUNTER — Ambulatory Visit (INDEPENDENT_AMBULATORY_CARE_PROVIDER_SITE_OTHER): Payer: Medicare Other | Admitting: Internal Medicine

## 2016-05-22 VITALS — BP 128/82 | HR 60 | Temp 97.5°F | Ht 62.0 in | Wt 179.4 lb

## 2016-05-22 DIAGNOSIS — E7800 Pure hypercholesterolemia, unspecified: Secondary | ICD-10-CM

## 2016-05-22 DIAGNOSIS — K838 Other specified diseases of biliary tract: Secondary | ICD-10-CM | POA: Diagnosis not present

## 2016-05-22 DIAGNOSIS — A09 Infectious gastroenteritis and colitis, unspecified: Secondary | ICD-10-CM | POA: Diagnosis not present

## 2016-05-22 DIAGNOSIS — I10 Essential (primary) hypertension: Secondary | ICD-10-CM

## 2016-05-22 DIAGNOSIS — K529 Noninfective gastroenteritis and colitis, unspecified: Secondary | ICD-10-CM

## 2016-05-22 HISTORY — DX: Pure hypercholesterolemia, unspecified: E78.00

## 2016-05-22 HISTORY — DX: Essential (primary) hypertension: I10

## 2016-05-22 HISTORY — DX: Other specified diseases of biliary tract: K83.8

## 2016-05-22 LAB — HEPATIC FUNCTION PANEL
ALBUMIN: 4 g/dL (ref 3.6–5.1)
ALK PHOS: 53 U/L (ref 33–130)
ALT: 10 U/L (ref 6–29)
AST: 19 U/L (ref 10–35)
BILIRUBIN TOTAL: 0.6 mg/dL (ref 0.2–1.2)
Bilirubin, Direct: 0.1 mg/dL (ref <=0.2)
Indirect Bilirubin: 0.5 mg/dL (ref 0.2–1.2)
TOTAL PROTEIN: 6.5 g/dL (ref 6.1–8.1)

## 2016-05-22 NOTE — Progress Notes (Signed)
   Subjective:    Patient ID: Gloria AlasMary F Scales Manson Sanchez, female    DOB: 06/12/1940, 76 y.o.   MRN: [id number]  HPI Referred by Dr .Neita CarpSasser for rt upper quadrant pain. Dilated CBD.  Ad[facility] January for acute colitis. Symptoms include vomiting, diarrhea,  rectal bleeding.  She was covered with Cipro and Flagyl. She states she still has some abdominal pain.  Her appetite is pretty good. She says she abdominal pain across her mid abdomen. She says she is 75% better. Admitted x 4 days to [facility].  Rates pain at a 7. If she has a BM, the pain is relieved. If she has gas, the pain is worse.  She is having a BM x 1-2 a day. No melena or BRRB.  Hx of cholecystectomy in 1974. Hx of CBD stone 1976.  Hysterectomy in 1978 Diabetic x 10 yrs  Her last colonoscopy was 3 yrs ago by Dr. Gabriel CirrieMason and she says it was normal. (Will find report).   04/17/2016 CT abdomen/pelvis with CM: vomiting and diarrhea.: Inflammatory changes surrounding the splenic flexure, descending colon and proximal sigmoid  Colon suspicious for colitis of infectious or inflammatory etiology, no pneumatosis or mesenteric venous gast to suggest ischemia. Dilated CBD up to 2.1 cm. Suspect surgical absence of the GB. Recommend  correlation with laboratory values, follow MR or ERCP may be performed as indicated.   04/19/2016 H and H  11.0 and 32.5 ALP 60, AST 15.2, ALT 10.    Review of Systems Past Medical History:  Diagnosis Date  . Allergic rhinitis   . Common bile duct dilation 05/22/2016  . DM (diabetes mellitus) (HCC)   . Essential hypertension 05/22/2016  . High cholesterol 05/22/2016  . Hyperlipidemia   . Hypertension     Past Surgical History:  Procedure Laterality Date  . CHOLECYSTECTOMY    . complete hysterectomy      No Known Allergies  Current Outpatient Prescriptions on File Prior to Visit  Medication Sig Dispense Refill  . amLODipine (NORVASC) 5 MG tablet Take 5 mg by mouth daily.    . clonazePAM (KLONOPIN) 0.5  MG tablet Take 0.5 mg by mouth 2 (two) times daily as needed for anxiety.    . gabapentin (NEURONTIN) 300 MG capsule Take 1 capsule (300 mg total) by mouth at bedtime. 90 capsule 5  . losartan-hydrochlorothiazide (HYZAAR) 100-25 MG tablet Take 1 tablet by mouth daily.    . metFORMIN (GLUMETZA) 500 MG (MOD) 24 hr tablet Take 500 mg by mouth daily with breakfast.      . simvastatin (ZOCOR) 20 MG tablet Take 20 mg by mouth at bedtime.       No current facility-administered medications on file prior to visit.        Objective:   Physical Exam Blood pressure 128/82, pulse 60, temperature 97.5 F (36.4 C), height 5\' 2"  (1.575 m), weight 179 lb 6.4 oz (81.4 kg). Alert and oriented. Skin warm and dry. Oral mucosa is moist.   . Sclera anicteric, conjunctivae is pink. Thyroid not enlarged. No cervical lymphadenopathy. Lungs clear. Heart regular rate and rhythm.  Abdomen is soft. Bowel sounds are positive. No hepatomegaly. No abdominal masses felt. Rt upper quadrant tenderness.  No edema to lower extremities.          Assessment & Plan:  Dilated CBD.  Hx of CBD stone in 1976. Am going to get an MRI.   Colitis: Much better now.  Last colonoscopy 3 yrs ago.

## 2016-05-22 NOTE — Addendum Note (Signed)
Addended by: Len BlalockSETZER, Taz Vanness L on: 05/22/2016 10:27 AM   Modules accepted: Orders

## 2016-05-22 NOTE — Patient Instructions (Signed)
MRI abdomen. Hepatic function.

## 2016-05-26 ENCOUNTER — Other Ambulatory Visit (INDEPENDENT_AMBULATORY_CARE_PROVIDER_SITE_OTHER): Payer: Self-pay | Admitting: Internal Medicine

## 2016-05-26 ENCOUNTER — Ambulatory Visit (HOSPITAL_COMMUNITY)
Admission: RE | Admit: 2016-05-26 | Discharge: 2016-05-26 | Disposition: A | Discharge status: Home / Self Care | Payer: Medicare Other | Source: Ambulatory Visit | Attending: Internal Medicine | Admitting: Internal Medicine

## 2016-05-26 DIAGNOSIS — R935 Abnormal findings on diagnostic imaging of other abdominal regions, including retroperitoneum: Secondary | ICD-10-CM | POA: Diagnosis not present

## 2016-05-26 DIAGNOSIS — K838 Other specified diseases of biliary tract: Secondary | ICD-10-CM

## 2016-05-26 DIAGNOSIS — Z9049 Acquired absence of other specified parts of digestive tract: Secondary | ICD-10-CM | POA: Insufficient documentation

## 2016-05-26 LAB — POCT I-STAT CREATININE: Creatinine, Ser: 0.8 mg/dL (ref 0.44–1.00)

## 2016-05-26 MED ORDER — GADOBENATE DIMEGLUMINE 529 MG/ML IV SOLN
15.0000 mL | Freq: Once | INTRAVENOUS | Status: AC | PRN
  Administered 2016-05-26: 15 mL via INTRAVENOUS

## 2016-05-27 ENCOUNTER — Telehealth (INDEPENDENT_AMBULATORY_CARE_PROVIDER_SITE_OTHER): Payer: Self-pay | Admitting: Internal Medicine

## 2016-05-27 DIAGNOSIS — K529 Noninfective gastroenteritis and colitis, unspecified: Secondary | ICD-10-CM

## 2016-05-27 MED ORDER — METRONIDAZOLE 500 MG PO TABS: 500.0000 mg | ORAL_TABLET | Freq: Two times a day (BID) | ORAL | 0 refills | Status: DC

## 2016-05-27 MED ORDER — CIPROFLOXACIN HCL 500 MG PO TABS: 500.0000 mg | ORAL_TABLET | Freq: Two times a day (BID) | ORAL | 0 refills | Status: DC

## 2016-05-27 NOTE — Telephone Encounter (Signed)
Rx for Cipro and Flagyl sent to [facility]

## 2016-05-28 ENCOUNTER — Encounter (INDEPENDENT_AMBULATORY_CARE_PROVIDER_SITE_OTHER): Payer: Self-pay

## 2016-05-29 ENCOUNTER — Encounter (INDEPENDENT_AMBULATORY_CARE_PROVIDER_SITE_OTHER): Payer: Self-pay | Admitting: Internal Medicine

## 2016-05-29 NOTE — Progress Notes (Signed)
Appointment was given for 06/25/16 at 11:30am with Dorene Arerri Setzer, NP.  A letter was mailed to the patient.

## 2016-06-25 ENCOUNTER — Encounter (INDEPENDENT_AMBULATORY_CARE_PROVIDER_SITE_OTHER): Payer: Self-pay | Admitting: Internal Medicine

## 2016-06-25 ENCOUNTER — Encounter (INDEPENDENT_AMBULATORY_CARE_PROVIDER_SITE_OTHER): Payer: Self-pay

## 2016-06-25 ENCOUNTER — Ambulatory Visit (INDEPENDENT_AMBULATORY_CARE_PROVIDER_SITE_OTHER): Payer: Medicare Other | Admitting: Internal Medicine

## 2016-06-25 VITALS — BP 130/62 | HR 56 | Temp 97.5°F | Ht 62.0 in | Wt 177.5 lb

## 2016-06-25 DIAGNOSIS — K5289 Other specified noninfective gastroenteritis and colitis: Secondary | ICD-10-CM | POA: Diagnosis not present

## 2016-06-25 DIAGNOSIS — K838 Other specified diseases of biliary tract: Secondary | ICD-10-CM

## 2016-06-25 LAB — HEPATIC FUNCTION PANEL
ALT: 11 U/L (ref 6–29)
AST: 18 U/L (ref 10–35)
Albumin: 4.1 g/dL (ref 3.6–5.1)
Alkaline Phosphatase: 53 U/L (ref 33–130)
BILIRUBIN DIRECT: 0.2 mg/dL (ref <=0.2)
BILIRUBIN INDIRECT: 0.5 mg/dL (ref 0.2–1.2)
BILIRUBIN TOTAL: 0.7 mg/dL (ref 0.2–1.2)
Total Protein: 6.8 g/dL (ref 6.1–8.1)

## 2016-06-25 LAB — CBC WITH DIFFERENTIAL/PLATELET
BASOS PCT: 0 %
Basophils Absolute: 0 cells/uL (ref 0–200)
EOS PCT: 7 %
Eosinophils Absolute: 546 cells/uL — ABNORMAL HIGH (ref 15–500)
HEMATOCRIT: 33.9 % — AB (ref 35.0–45.0)
HEMOGLOBIN: 11.4 g/dL — AB (ref 11.7–15.5)
LYMPHS ABS: 3198 {cells}/uL (ref 850–3900)
Lymphocytes Relative: 41 %
MCH: 31.6 pg (ref 27.0–33.0)
MCHC: 33.6 g/dL (ref 32.0–36.0)
MCV: 93.9 fL (ref 80.0–100.0)
MONO ABS: 468 {cells}/uL (ref 200–950)
MPV: 8.8 fL (ref 7.5–12.5)
Monocytes Relative: 6 %
NEUTROS ABS: 3588 {cells}/uL (ref 1500–7800)
Neutrophils Relative %: 46 %
Platelets: 258 10*3/uL (ref 140–400)
RBC: 3.61 MIL/uL — AB (ref 3.80–5.10)
RDW: 14 % (ref 11.0–15.0)
WBC: 7.8 10*3/uL (ref 3.8–10.8)

## 2016-06-25 NOTE — Patient Instructions (Signed)
Labs. MRI recall

## 2016-06-25 NOTE — Progress Notes (Signed)
Subjective:    Patient ID: Gloria Sanchez, female    DOB: 1941-02-04, 76 y.o.   MRN: [id number]  HPI Here today for f/u. She was last seen in February. Had been admitted to [facility] for acute colitis. She was covered with Cipro and Flagyl.  Admitted x 4 days.  Also hx of dilated CBD with normal enzymes.  MRI on 05/27/2016 revealed (see below). She was covered with cipro and Flagyl x 10 days. She tells me she feels pretty good.  No abdominal pain. She has a BM every other day. No melena or BRRB.  Hx of cholecystectomy in 1974. Hx of CBD stone 1976.  Hysterectomy in 1978 Diabetic x 10 yrs  Her last colonoscopy was in 2015 by Dr. Anthony Sar and  by Dr. Anthony Sar and was normal.     Hepatic Function Latest Ref Rng & Units 05/22/2016  Total Protein 6.1 - 8.1 g/dL 6.5  Albumin 3.6 - 5.1 g/dL 4.0  AST 10 - 35 U/L 19  ALT 6 - 29 U/L 10  Alk Phosphatase 33 - 130 U/L 53  Total Bilirubin 0.2 - 1.2 mg/dL 0.6  Bilirubin, Direct <=0.2 mg/dL 0.1     05/27/2016 MRI abdomen w/wo cm: dilated CBD  IMPRESSION: 1. Mild to moderate motion degradation. 2. Cholecystectomy with borderline intrahepatic and moderate extrahepatic biliary duct dilatation. Given the normal bilirubin level on 05/22/2016, this is most likely within normal variation. An area of vague post-contrast enhancement in the region of the ampulla is indeterminate. If there is a strong clinical concern of otherwise occult ampullary stenosis or lesion, ERCP could be performed. 3. Improved versus recurrent colitis involving the splenic flexure. Distribution could be seen with ischemia or infection. 4.  Possible constipation.   04/17/2016 CT abdomen/pelvis with CM: vomiting and diarrhea.: Inflammatory changes surrounding the splenic flexure, descending colon and proximal sigmoid  Colon suspicious for colitis of infectious or inflammatory etiology, no pneumatosis or mesenteric venous gast to suggest ischemia. Dilated CBD up to 2.1 cm. Suspect  surgical absence of the GB. Recommend  correlation with laboratory values, follow MR or ERCP may be performed as indicated.   Review of Systems Past Medical History:  Diagnosis Date  . Allergic rhinitis   . Common bile duct dilation 05/22/2016  . DM (diabetes mellitus) ([place])   . Essential hypertension 05/22/2016  . High cholesterol 05/22/2016  . Hyperlipidemia   . Hypertension     Past Surgical History:  Procedure Laterality Date  . CHOLECYSTECTOMY    . complete hysterectomy      No Known Allergies  Current Outpatient Prescriptions on File Prior to Visit  Medication Sig Dispense Refill  . amLODipine (NORVASC) 5 MG tablet Take 5 mg by mouth daily.    . ciprofloxacin (CIPRO) 500 MG tablet Take 1 tablet (500 mg total) by mouth 2 (two) times daily. 20 tablet 0  . clonazePAM (KLONOPIN) 0.5 MG tablet Take 0.5 mg by mouth 2 (two) times daily as needed for anxiety.    . gabapentin (NEURONTIN) 300 MG capsule Take 1 capsule (300 mg total) by mouth at bedtime. 90 capsule 5  . losartan-hydrochlorothiazide (HYZAAR) 100-25 MG tablet Take 1 tablet by mouth daily.    . metFORMIN (GLUMETZA) 500 MG (MOD) 24 hr tablet Take 500 mg by mouth daily with breakfast.      . metroNIDAZOLE (FLAGYL) 500 MG tablet Take 1 tablet (500 mg total) by mouth 2 (two) times daily. 20 tablet 0  . pantoprazole (PROTONIX) 40  MG tablet Take 40 mg by mouth daily.    . simvastatin (ZOCOR) 20 MG tablet Take 20 mg by mouth at bedtime.       No current facility-administered medications on file prior to visit.        Objective:   Physical Exam Blood pressure 130/62, pulse (!) 56, temperature 97.5 F (36.4 C), height 5' 2" (1.575 m), weight 177 lb 8 oz (80.5 kg).  Alert and oriented. Skin warm and dry. Oral mucosa is moist.   . Sclera anicteric, conjunctivae is pink. Thyroid not enlarged. No cervical lymphadenopathy. Lungs clear. Heart regular rate and rhythm.  Abdomen is soft. Bowel sounds are positive. No hepatomegaly. No  abdominal masses felt. No tenderness.  No edema to lower extremities.  .       Assessment & Plan:  Dilated CBD with normal enzymes. Recall in 5 months for repeat MRI. Colitis: Symptoms have resolved. Am going to get a CBC,sedrate, and Hepatic function.

## 2016-06-26 LAB — SEDIMENTATION RATE: SED RATE: 30 mm/h (ref 0–30)

## 2016-10-26 ENCOUNTER — Encounter (INDEPENDENT_AMBULATORY_CARE_PROVIDER_SITE_OTHER): Payer: Self-pay | Admitting: *Deleted

## 2016-11-03 ENCOUNTER — Telehealth (INDEPENDENT_AMBULATORY_CARE_PROVIDER_SITE_OTHER): Payer: Self-pay | Admitting: Internal Medicine

## 2016-11-03 ENCOUNTER — Telehealth (INDEPENDENT_AMBULATORY_CARE_PROVIDER_SITE_OTHER): Payer: Self-pay | Admitting: *Deleted

## 2016-11-03 DIAGNOSIS — K831 Obstruction of bile duct: Secondary | ICD-10-CM

## 2016-11-03 DIAGNOSIS — K838 Other specified diseases of biliary tract: Secondary | ICD-10-CM

## 2016-11-03 NOTE — Telephone Encounter (Signed)
Patient is on recall for 6 mth MRI, need order please

## 2016-11-03 NOTE — Telephone Encounter (Signed)
MRI ordered

## 2016-11-03 NOTE — Telephone Encounter (Signed)
MRI sch'd 11/12/16, patient aware

## 2016-11-03 NOTE — Telephone Encounter (Signed)
ordered

## 2016-11-12 ENCOUNTER — Other Ambulatory Visit (INDEPENDENT_AMBULATORY_CARE_PROVIDER_SITE_OTHER): Payer: Self-pay | Admitting: Internal Medicine

## 2016-11-12 ENCOUNTER — Ambulatory Visit (HOSPITAL_COMMUNITY)
Admission: RE | Admit: 2016-11-12 | Discharge: 2016-11-12 | Disposition: A | Discharge status: Home / Self Care | Payer: Medicare Other | Source: Ambulatory Visit | Attending: Internal Medicine | Admitting: Internal Medicine

## 2016-11-12 DIAGNOSIS — K831 Obstruction of bile duct: Secondary | ICD-10-CM

## 2016-11-12 DIAGNOSIS — K838 Other specified diseases of biliary tract: Secondary | ICD-10-CM

## 2016-11-12 DIAGNOSIS — I517 Cardiomegaly: Secondary | ICD-10-CM | POA: Diagnosis not present

## 2016-11-12 DIAGNOSIS — M47815 Spondylosis without myelopathy or radiculopathy, thoracolumbar region: Secondary | ICD-10-CM | POA: Diagnosis not present

## 2016-11-12 DIAGNOSIS — M5135 Other intervertebral disc degeneration, thoracolumbar region: Secondary | ICD-10-CM | POA: Insufficient documentation

## 2016-11-12 LAB — POCT I-STAT CREATININE: CREATININE: 0.8 mg/dL (ref 0.44–1.00)

## 2016-11-12 MED ORDER — GADOBENATE DIMEGLUMINE 529 MG/ML IV SOLN
15.0000 mL | Freq: Once | INTRAVENOUS | Status: AC | PRN
  Administered 2016-11-12: 15 mL via INTRAVENOUS

## 2016-11-16 ENCOUNTER — Telehealth (INDEPENDENT_AMBULATORY_CARE_PROVIDER_SITE_OTHER): Payer: Self-pay | Admitting: Internal Medicine

## 2016-11-16 ENCOUNTER — Other Ambulatory Visit (INDEPENDENT_AMBULATORY_CARE_PROVIDER_SITE_OTHER): Payer: Self-pay | Admitting: *Deleted

## 2016-11-16 DIAGNOSIS — K831 Obstruction of bile duct: Secondary | ICD-10-CM

## 2016-11-16 NOTE — Telephone Encounter (Signed)
Patient called and stated that she had a MRI done last week and would like the results.  [phone]

## 2016-11-16 NOTE — Telephone Encounter (Signed)
Results have been given to patient 

## 2016-11-18 ENCOUNTER — Encounter: Payer: Self-pay | Admitting: Obstetrics and Gynecology

## 2016-11-18 ENCOUNTER — Ambulatory Visit (INDEPENDENT_AMBULATORY_CARE_PROVIDER_SITE_OTHER): Payer: Medicare Other | Admitting: Obstetrics and Gynecology

## 2016-11-18 VITALS — BP 124/68 | HR 54 | Ht 62.0 in | Wt 182.0 lb

## 2016-11-18 DIAGNOSIS — N8110 Cystocele, unspecified: Secondary | ICD-10-CM | POA: Diagnosis not present

## 2016-11-18 NOTE — Progress Notes (Signed)
Patient ID: Jewel Thull Scales Manson Sanchez, female   DOB: December 25, 1940, 75 y.o.   MRN: [id number]  [facility] Clinic Visit  @DATE @            Patient name: Gloria Sanchez MRN [id number]  Date of birth: 1941-01-22  CC & HPI:  Gloria Sanchez is a G2P2 76 y.o. female presenting today for bladder issues. Pt notes that she has had a complete hysterectomy in 1978 completed by Dr. Boris Lown reports this was a vaginal hysterectomy and then an abdominal procedure to remove an ovarian tumor its unclear if these were one surgery or 2 surgeries. Pt reports that she has had cholecystectomy. 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 The husband requires lots of supportive care. He's able to use a walker the patient  Denies constipation and any other symptoms. She does notice some loss of urine  ROS:  ROS +Bladder prolapse -constipationShe denies difficulty expelling bowel movements  All systems are negative except as noted in the HPI and PMH.    Pertinent History Reviewed:   Reviewed: Significant for DM, HTN Medical         Past Medical History:  Diagnosis Date  . Allergic rhinitis   . Common bile duct dilation 05/22/2016  . DM (diabetes mellitus) ([facility])   . Essential hypertension 05/22/2016  . High cholesterol 05/22/2016  . Hyperlipidemia   . Hypertension                               Surgical Hx:    Past Surgical History:  Procedure Laterality Date  . CHOLECYSTECTOMY    . complete hysterectomy    . REPLACEMENT TOTAL KNEE     Medications: Reviewed & Updated - see associated section                       Current Outpatient Prescriptions:  .  amLODipine (NORVASC) 5 MG tablet, Take 5 mg by mouth daily., Disp: , Rfl:  .  clonazePAM (KLONOPIN) 0.5 MG tablet, Take 0.5 mg by mouth 2 (two) times daily as needed for anxiety., Disp: , Rfl:  .  gabapentin (NEURONTIN) 300 MG capsule, Take 1 capsule (300 mg total) by mouth at bedtime., Disp: 90 capsule, Rfl: 5 .   losartan-hydrochlorothiazide (HYZAAR) 100-25 MG tablet, Take 1 tablet by mouth daily., Disp: , Rfl:  .  metFORMIN (GLUMETZA) 500 MG (MOD) 24 hr tablet, Take 500 mg by mouth daily with breakfast.  , Disp: , Rfl:  .  metroNIDAZOLE (FLAGYL) 500 MG tablet, Take 1 tablet (500 mg total) by mouth 2 (two) times daily., Disp: 20 tablet, Rfl: 0 .  pantoprazole (PROTONIX) 40 MG tablet, Take 40 mg by mouth daily., Disp: , Rfl:  .  simvastatin (ZOCOR) 20 MG tablet, Take 20 mg by mouth at bedtime.  , Disp: , Rfl:    Social History: Reviewed -  reports that she has never smoked. She has never used smokeless tobacco.  Objective Findings:  Vitals: Blood pressure 124/68, pulse (!) 54, height 5\' 2"  (1.575 m), weight 182 lb (82.6 kg).  Physical Examination:  Pelvic - normal external genitalia, vulva, vagina, cervix, uterus and adnexa,  VULVA: normal appearing vulva with no masses, tenderness or lesionsRelaxed introitus with cystocele bulging to the introitus when resting, protrudes 1-2 cm when patient strains, no loss of urine with cough  VAGINA: normal appearing vagina with normal color and discharge, no lesions, Short vaginal length with prolapse of the bladder through the introitus.  Pt has a relaxed introitus. Bladder bulges through the introitus with valsalva maneuver.  CERVIX: surgically absent,  UTERUS: surgically absent, vaginal cuff well healed,  ADNEXA: surgically absent bilateral RECTAL: Posterior wall support is adequate. Hemoccult appears negative.   Assessment & Plan:   A:  1. Grade 3 cystocele 2. S/p vaginal hysterectomy 3. Relaxed introitus  P:  1. Follow up for pessary fitting and post-forward residual in 1-2 weeks    By signing my name below, I, Soijett Blue, attest that this documentation has been prepared under the direction and in the presence of Tilda Burrow, MD. Electronically Signed: Soijett Blue, ED Scribe. 11/18/16. 1:45 PM.  I personally performed the services described  in this documentation, which was SCRIBED in my presence. The recorded information has been reviewed and considered accurate. It has been edited as necessary during review. Tilda Burrow, MD

## 2016-12-02 ENCOUNTER — Encounter: Payer: Self-pay | Admitting: Obstetrics and Gynecology

## 2016-12-02 ENCOUNTER — Ambulatory Visit (INDEPENDENT_AMBULATORY_CARE_PROVIDER_SITE_OTHER): Payer: Medicare Other | Admitting: Obstetrics and Gynecology

## 2016-12-02 DIAGNOSIS — N393 Stress incontinence (female) (male): Secondary | ICD-10-CM | POA: Diagnosis not present

## 2016-12-02 NOTE — Progress Notes (Addendum)
Patient ID: Elon AlasMary F Scales Manson PasseyBrown, female   DOB: 12-May-1940, 76 y.o.   MRN: [id number] GYNECOLOGY CLINIC PROGRESS NOTE  The patient presented today for a pessary fitting due to grade 3 cystocele. She reports no vaginal bleeding or discharge. She denies pelvic discomfort and difficulty urinating or moving her bowels. Physical Examination: Abdomen - soft, nontender, nondistended, no masses or organomegaly Obesity limits exam Pelvic - VULVA: cystocele thru introitus , VAGINA: PELVIC FLOOR EXAM: cystocele 3, vaginal vault. Speculum examination revealed normal vaginal mucosa with no lesions or lacerations. The patient was fitted with 3 inch gellhorn pessary and ensured that the patient was without complications. Pt cought and has + Gaynell FaceMarshall test. The patient should return in 1 week for a pessary check and ordering of pessary..   A:  1. Grade 3 cystocele 2 SUI P: 1. Follow up in 5-7 days for pessary check and bedside US 2. Refer the patient to Urologist, Dr. Iona CoachMcDairmid if she needs  To be considered for combined procedure. Repair and sling.  By signing my name below, I, Soijett Blue, attest that this documentation has been prepared under the direction and in the presence of Tilda BurrowFerguson, [place] Foerster V, MD. Electronically Signed: Soijett Blue, ED Scribe. 12/02/16. 9:29 AM.  I personally performed the services described in this documentation, which was SCRIBED in my presence. The recorded information has been reviewed and considered accurate. It has been edited as necessary during review. Tilda BurrowFERGUSON,Gracynn Rajewski V, MD

## 2016-12-04 DIAGNOSIS — N393 Stress incontinence (female) (male): Secondary | ICD-10-CM | POA: Insufficient documentation

## 2016-12-07 ENCOUNTER — Ambulatory Visit (INDEPENDENT_AMBULATORY_CARE_PROVIDER_SITE_OTHER): Payer: Medicare Other | Admitting: Obstetrics and Gynecology

## 2016-12-07 ENCOUNTER — Encounter: Payer: Self-pay | Admitting: Obstetrics and Gynecology

## 2016-12-07 VITALS — BP 142/70 | HR 58 | Wt 182.2 lb

## 2016-12-07 DIAGNOSIS — N816 Rectocele: Secondary | ICD-10-CM

## 2016-12-07 DIAGNOSIS — N8110 Cystocele, unspecified: Secondary | ICD-10-CM

## 2016-12-07 NOTE — Patient Instructions (Signed)
About Cystocele  Overview  The pelvic organs, including the bladder, are normally supported by pelvic floor muscles and ligaments.  When these muscles and ligaments are stretched, weakened or torn, the wall between the bladder and the vagina sags or herniates causing a prolapse, sometimes called a cystocele.  This condition may cause discomfort and problems with emptying the bladder.  It can be present in various stages.  Some people are not aware of the changes.  Others may notice changes at the vaginal opening or a feeling of the bladder dropping outside the body.  Causes of a Cystocele  A cystocele is usually caused by muscle straining or stretching during childbirth.  In addition, cystocele is more common after menopause, because the hormone estrogen helps keep the elastic tissues around the pelvic organs strong.  A cystocele is more likely to occur when levels of estrogen decrease.  Other causes include: heavy lifting, chronic coughing, previous pelvic surgery and obesity.  Symptoms  A bladder that has dropped from its normal position may cause: unwanted urine leakage (stress incontinence), frequent urination or urge to urinate, incomplete emptying of the bladder (not feeling bladder relief after emptying), pain or discomfort in the vagina, pelvis, groin, lower back or lower abdomen and frequent urinary tract infections.  Mild cases may not cause any symptoms.  Treatment Options  Pelvic floor (Kegel) exercises:  Strength training the muscles in your genital area  Behavioral changes: Treating and preventing constipation, taking time to empty your bladder properly, learning to lift properly and/or avoid heavy lifting when possible, stopping smoking, avoiding weight gain and treating a chronic cough or bronchitis.  A pessary: A vaginal support device is sometimes used to help pelvic support caused by muscle and ligament changes.  Surgery: Surgical repair may be necessary if symptoms cannot  be managed with exercise, behavioral changes and a pessary.  Surgery is usually considered for severe cases.   2007, [employer] Cystocele Repair Cystocele repair is surgery to fix a cystocele, which is a bulging, drooping area (hernia) of the bladder that extends into the vagina. This bulging occurs on the top front (anterior) wall of the vagina. The condition may also be called an anterior prolapse or a prolapsed bladder. Tell a health care provider about:  Any allergies you have.  All medicines you are taking, including vitamins, herbs, eye drops, creams, and over-the-counter medicines.  Any problems you or family members have had with anesthetic medicines.  Any blood disorders you have.  Any surgeries you have had.  Any medical conditions you have.  Whether you are pregnant or may be pregnant. What are the risks? Generally, this is a safe procedure. However, problems may occur, including:  Infection.  Too much bleeding.  Allergic reactions to medicines.  Damage to other structures or organs.  Problems with the urinary drainage tube (catheter), such as blockage.  Return of the cystocele.  Problems with the vaginal mesh.  What happens before the procedure? Staying hydrated Follow instructions from your health care provider about hydration, which may include:  Up to 2 hours before the procedure - you may continue to drink clear liquids, such as water, clear fruit juice, black coffee, and plain tea.  Eating and drinking restrictions Follow instructions from your health care provider about eating and drinking, which may include:  8 hours before the procedure - stop eating heavy meals or foods such as meat, fried foods, or fatty foods.  6 hours before the procedure - stop eating light meals  or foods, such as toast or cereal.  6 hours before the procedure - stop drinking milk or drinks that contain milk.  2 hours before the procedure - stop drinking  clear liquids.  Medicines  Ask your health care provider about: ? Changing or stopping your regular medicines. This is especially important if you are taking diabetes medicines or blood thinners. ? Taking medicines such as aspirin and ibuprofen. These medicines can thin your blood. Do not take these medicines before your procedure if your health care provider instructs you not to.  You may be given antibiotic medicine to help prevent infection. General instructions  Do not use any products that contain nicotine or tobacco-such as cigarettes and e-cigarettes-for at least 2 weeks before the procedure. If you need help quitting, ask your health care provider.  Do not drink any alcohol for 3 days before the surgery.  Plan to have someone take you home from the hospital or clinic.  Ask your health care provider how your surgical site will be marked or identified. What happens during the procedure?  To reduce your risk of infection: ? Your health care team will wash or sanitize their hands. ? Your skin will be washed with soap.  You will be given one or more of the following: ? A medicine to make you fall asleep (general anesthetic). ? A medicine that is injected into your spine to numb the area below and slightly above the injection site (spinal anesthetic). ? A medicine that is injected into an area of your body to numb everything below the injection site (regional anesthetic).  A thin, flexible tube (indwelling urinary catheter) will be placed in your bladder to drain urine during and after the surgery.  The surgery will be performed through the vagina. An incision will be made in the front wall of the vagina.  The muscle between the bladder and vagina will be pulled up to its normal position. Stitches (sutures) or a piece of mesh will be used to support the muscle and hold it in place. This will remove the hernia so the top of the bladder does not fall into the opening of the  vagina.  The incision on the front wall of the vagina will then be closed with stitches that will dissolve safely into your body and do not need to be removed. The procedure may vary among health care providers and hospitals. What happens after the procedure?  Your blood pressure, heart rate, breathing rate, and blood oxygen level will be monitored until the medicines you were given have worn off.  You will have a catheter in place to drain your bladder. This will stay in place for 2-7 days or until your bladder is working well on its own.  You may have gauze packing in the vagina. This will be removed 1-2 days after the surgery.  You will be given pain medicine as needed.  You may be given antibiotics to fight infection. This information is not intended to replace advice given to you by your health care provider. Make sure you discuss any questions you have with your health care provider. Document Released: 03/13/2000 Document Revised: 10/18/2015 Document Reviewed: 06/06/2015 [employer] Interactive Patient Education  2018 [employer]

## 2016-12-07 NOTE — Progress Notes (Signed)
Patient ID: Gloria Sanchez, female   DOB: 1940/11/04, 76 y.o.   MRN: [id number] Preoperative History and Physical  Gloria Sanchez is a 76 y.o. G2P2 here for surgical management of a grade 3 cystocele and grade 3 rectocele. No significant preoperative concerns.  Proposed surgery: Anterior and Posterior Repair  Past Medical History:  Diagnosis Date  . Allergic rhinitis   . Common bile duct dilation 05/22/2016  . DM (diabetes mellitus) (HCC)   . Essential hypertension 05/22/2016  . High cholesterol 05/22/2016  . Hyperlipidemia   . Hypertension    Past Surgical History:  Procedure Laterality Date  . CHOLECYSTECTOMY    . complete hysterectomy    . REPLACEMENT TOTAL KNEE     OB History  Gravida Para Term Preterm AB Living  SAB TAB Ectopic Multiple Live Births               # Outcome Date GA Lbr Len/2nd Weight Sex Delivery Anes PTL Lv  2 Para           1 Para             Patient denies any other pertinent gynecologic issues.   Current Outpatient Prescriptions on File Prior to Visit  Medication Sig Dispense Refill  . amLODipine (NORVASC) 5 MG tablet Take 5 mg by mouth daily.    . clonazePAM (KLONOPIN) 0.5 MG tablet Take 0.5 mg by mouth 2 (two) times daily as needed for anxiety.    . gabapentin (NEURONTIN) 300 MG capsule Take 1 capsule (300 mg total) by mouth at bedtime. 90 capsule 5  . losartan-hydrochlorothiazide (HYZAAR) 100-25 MG tablet Take 1 tablet by mouth daily.    . metFORMIN (GLUMETZA) 500 MG (MOD) 24 hr tablet Take 500 mg by mouth daily with breakfast.      . pantoprazole (PROTONIX) 40 MG tablet Take 40 mg by mouth daily.    . simvastatin (ZOCOR) 20 MG tablet Take 20 mg by mouth at bedtime.       No current facility-administered medications on file prior to visit.    No Known Allergies  Social History:   reports that she has never smoked. She has never used smokeless tobacco. She reports that she does not drink alcohol or use drugs.  Family  History  Problem Relation Age of Onset  . Cancer Sister   . Diabetes Sister     Review of Systems: Noncontributory  PHYSICAL EXAM: Blood pressure (!) 142/70, pulse (!) 58, weight 182 lb 3.2 oz (82.6 kg). General appearance - alert, well appearing, and in no distress Chest - clear to auscultation, no wheezes, rales or rhonchi, symmetric air entry Heart - normal rate and regular rhythm Abdomen - soft, nontender, nondistended, no masses or organomegaly  Pelvic - examination I personally performed the services described in this documentation, which was SCRIBED in my presence. The recorded information has been reviewed and considered accurate. It has been edited as necessary during review. Tilda Burrow, MD   Extremities - peripheral pulses normal, no pedal edema, no clubbing or cyanosis  Post resisdual minimal by u/s   Labs: Hemoccult negative No results found for this or any previous visit (from the past 336 hour(s)).  Imaging Studies: Mr 3d Recon At Scanner  Result Date: 11/12/2016 CLINICAL DATA:  Common bile duct dilatation/ obstruction follow up. EXAM: MRI ABDOMEN WITHOUT AND WITH CONTRAST (INCLUDING MRCP) TECHNIQUE: Multiplanar multisequence MR imaging of the  abdomen was performed both before and after the administration of intravenous contrast. Heavily T2-weighted images of the biliary and pancreatic ducts were obtained, and three-dimensional MRCP images were rendered by post processing. CONTRAST:  15mL MULTIHANCE GADOBENATE DIMEGLUMINE 529 MG/ML IV SOLN COMPARISON:  Multiple exams, including 05/26/2016 FINDINGS: Despite efforts by the technologist and patient, motion artifact is present on today's exam and could not be eliminated. This reduces exam sensitivity and specificity. The previous accentuated enhancement along the ampulla is no longer seen. Lower chest: Mild cardiomegaly. Hepatobiliary: Minimal intrahepatic biliary dilatation is present. Common hepatic duct 1.9 cm  diameter. Common bile duct 1.1 cm. On some images such as image 55/6, there is an oval-shaped T2 signal hypointensity measuring about 0.8 by 0.5 cm just anterior to the distal margin of the CBD, but this is poorly correlated on other imaging sequences is specially those affected by motion artifact. I am skeptical that this is actually within the duct. The possibility of a small periampullary duodenal diverticulum is raised as a potential cause given the in constant appearance the distal CBD appears to taper in a conical fashion on image 20/4. There is no calcification in this portion of the pancreas on 02/15/2017. No significant abnormal focal enhancement in the liver. Pancreas: There is are several tiny slightly dilated side ducts but otherwise the pancreas appears normal. No dilatation of the dorsal pancreatic duct. No abnormal pancreatic enhancement is identified. Spleen:  Unremarkable Stomach/Bowel: Unremarkable Vascular/Lymphatic:  Unremarkable Other:  No supplemental non-categorized findings. Musculoskeletal: Thoracic and lumbar spondylosis and degenerative disc disease. IMPRESSION: 1. Dilated extrahepatic biliary tree without an obvious cause. Conical tapering in the vicinity of the ampulla without abnormal enhancement in this region. There is a tiny low signal intensity structure just anterior to the distal CBD along the pancreas for, without abnormal enhancement, an without calcification in this vicinity on the prior CT scan, eye speculated this could be a tiny periampullary duodenal diverticulum or a new punctate calcification in the pancreatic head. The lack of abnormal enhancement in this vicinity argues against a neoplastic process, and this tiny the structure does not appear to be intraluminal within the CBD. 2. Mild cardiomegaly. 3. Several slightly dilated pancreatic side ducts, possibly the result of prior pancreatitis. 4. Thoracolumbar spondylosis and degenerative disc disease. Electronically  Signed   By: Gaylyn RongWalter  Liebkemann M.D.   On: 11/12/2016 12:00   Mr Abdomen Mrcp W Wo Contast  Result Date: 11/12/2016 CLINICAL DATA:  Common bile duct dilatation/ obstruction follow up. EXAM: MRI ABDOMEN WITHOUT AND WITH CONTRAST (INCLUDING MRCP) TECHNIQUE: Multiplanar multisequence MR imaging of the abdomen was performed both before and after the administration of intravenous contrast. Heavily T2-weighted images of the biliary and pancreatic ducts were obtained, and three-dimensional MRCP images were rendered by post processing. CONTRAST:  15mL MULTIHANCE GADOBENATE DIMEGLUMINE 529 MG/ML IV SOLN COMPARISON:  Multiple exams, including 05/26/2016 FINDINGS: Despite efforts by the technologist and patient, motion artifact is present on today's exam and could not be eliminated. This reduces exam sensitivity and specificity. The previous accentuated enhancement along the ampulla is no longer seen. Lower chest: Mild cardiomegaly. Hepatobiliary: Minimal intrahepatic biliary dilatation is present. Common hepatic duct 1.9 cm diameter. Common bile duct 1.1 cm. On some images such as image 55/6, there is an oval-shaped T2 signal hypointensity measuring about 0.8 by 0.5 cm just anterior to the distal margin of the CBD, but this is poorly correlated on other imaging sequences is specially those affected by motion artifact. I am skeptical  that this is actually within the duct. The possibility of a small periampullary duodenal diverticulum is raised as a potential cause given the in constant appearance the distal CBD appears to taper in a conical fashion on image 20/4. There is no calcification in this portion of the pancreas on 02/15/2017. No significant abnormal focal enhancement in the liver. Pancreas: There is are several tiny slightly dilated side ducts but otherwise the pancreas appears normal. No dilatation of the dorsal pancreatic duct. No abnormal pancreatic enhancement is identified. Spleen:  Unremarkable  Stomach/Bowel: Unremarkable Vascular/Lymphatic:  Unremarkable Other:  No supplemental non-categorized findings. Musculoskeletal: Thoracic and lumbar spondylosis and degenerative disc disease. IMPRESSION: 1. Dilated extrahepatic biliary tree without an obvious cause. Conical tapering in the vicinity of the ampulla without abnormal enhancement in this region. There is a tiny low signal intensity structure just anterior to the distal CBD along the pancreas for, without abnormal enhancement, an without calcification in this vicinity on the prior CT scan, eye speculated this could be a tiny periampullary duodenal diverticulum or a new punctate calcification in the pancreatic head. The lack of abnormal enhancement in this vicinity argues against a neoplastic process, and this tiny the structure does not appear to be intraluminal within the CBD. 2. Mild cardiomegaly. 3. Several slightly dilated pancreatic side ducts, possibly the result of prior pancreatitis. 4. Thoracolumbar spondylosis and degenerative disc disease. Electronically Signed   By: Gaylyn Rong M.D.   On: 11/12/2016 12:00    Assessment: 1. Grade 3 cystocele 2. Grade 3 rectocele 3. Hx prior Hysterectomy 4. Obesity  Patient Active Problem List   Diagnosis Date Noted  . SUI (stress urinary incontinence, female) 12/04/2016  . Baden-Walker grade 3 cystocele 11/18/2016  . Essential hypertension 05/22/2016  . High cholesterol 05/22/2016  . Common bile duct dilation 05/22/2016  . S/P total knee replacement 03/04/2011  . KNEE PAIN 10/24/2007  . TOTAL KNEE FOLLOW-UP 10/24/2007    Plan: Patient will undergo surgical management with Anterior and Posterior Repair.     .mec 12/07/2016 9:24 AM  By signing my name below, I, Diona Browner, attest that this documentation has been prepared under the direction and in the presence of Tilda Burrow, MD. Electronically Signed: Diona Browner, Medical Scribe. 12/07/16. 9:25 AM.  I  personally performed the services described in this documentation, which was SCRIBED in my presence. The recorded information has been reviewed and considered accurate. It has been edited as necessary during review. Tilda Burrow, MD

## 2016-12-08 LAB — BASIC METABOLIC PANEL
BUN/Creatinine Ratio: 18 (ref 12–28)
BUN: 13 mg/dL (ref 8–27)
CALCIUM: 10 mg/dL (ref 8.7–10.3)
CO2: 26 mmol/L (ref 20–29)
CREATININE: 0.73 mg/dL (ref 0.57–1.00)
Chloride: 99 mmol/L (ref 96–106)
GFR calc Af Amer: 94 mL/min/{1.73_m2} (ref >59)
GFR, EST NON AFRICAN AMERICAN: 81 mL/min/{1.73_m2} (ref >59)
Glucose: 105 mg/dL — ABNORMAL HIGH (ref 65–99)
Potassium: 4.2 mmol/L (ref 3.5–5.2)
Sodium: 144 mmol/L (ref 134–144)

## 2016-12-08 LAB — CBC
HEMATOCRIT: 36.8 % (ref 34.0–46.6)
HEMOGLOBIN: 12.6 g/dL (ref 11.1–15.9)
MCH: 32.1 pg (ref 26.6–33.0)
MCHC: 34.2 g/dL (ref 31.5–35.7)
MCV: 94 fL (ref 79–97)
Platelets: 273 10*3/uL (ref 150–379)
RBC: 3.92 x10E6/uL (ref 3.77–5.28)
RDW: 13.6 % (ref 12.3–15.4)
WBC: 8 10*3/uL (ref 3.4–10.8)

## 2016-12-23 NOTE — Patient Instructions (Signed)
Gloria Sanchez  12/23/2016     @   Your procedure is scheduled on  12/29/2016   Report to [facility] at  615  A.M.  Call this number if you have problems the morning of surgery:  [phone]   Remember:  Do not eat food or drink liquids after midnight.  Take these medicines the morning of surgery with A SIP OF WATER  Norvasc, clonazepam, neurontin, losartan, protonix.   Do not wear jewelry, make-up or nail polish.  Do not wear lotions, powders, or perfumes, or deoderant.  Do not shave 48 hours prior to surgery.  Men may shave face and neck.  Do not bring valuables to the hospital.  [facility] is not responsible for any belongings or valuables.  Contacts, dentures or bridgework may not be worn into surgery.  Leave your suitcase in the car.  After surgery it may be brought to your room.  For patients admitted to the hospital, discharge time will be determined by your treatment team.  Patients discharged the day of surgery will not be allowed to drive home.   Name and phone number of your driver:   family Special instructions:  None  Please read over the following fact sheets that you were given. Anesthesia Post-op Instructions and Care and Recovery After Surgery       Anterior and Posterior Colporrhaphy Anterior or posterior colporrhaphy is surgery to fix a prolapse of organs in the genital tract. Prolapse means the falling down, bulging, dropping, or drooping of an organ. Organs that commonly prolapse include the rectum, bladder, vagina, and uterus. Prolapse can affect a single organ or several organs at the same time. This often worsens when women stop having their monthly periods (menopause) because estrogen loss weakens the muscles and tissues in the genital tract. In addition, prolapse happens when the organs are damaged or weakened. This commonly happens after childbirth and as a result of aging. Surgery is often done for severe  prolapses. The type of colporrhaphy done depends on the type of genital prolapse. Types of genital prolapse include the following:  Cystocele. This is a prolapse of the upper (anterior) wall of the vagina. The anterior wall bulges into the vagina and brings the bladder with it.  Rectocele. This is a prolapse of the lower (posterior) wall of the vagina. The posterior vaginal wall bulges into the vagina and brings the rectum with it.  Enterocele. This is a prolapse of part of the pelvic organs called the pouch of Douglas. It also involves a portion of the small bowel. It appears as a bulge under the neck of the uterus at the top of the back wall of the vagina.  Procidentia. This is a complete prolapse of the uterus and the cervix. The prolapse can be seen and felt coming out of the vagina.  LET [employer] CARE PROVIDER KNOW ABOUT:  Any allergies you have.  All medicines you are taking, including vitamins, herbs, eye drops, creams, and over-the-counter medicines.  Previous problems you or members of your family have had with the use of anesthetics.  Any blood disorders you have.  Previous surgeries you have had.  Medical conditions you have.  Smoking history or history of alcohol use.  Possibility of pregnancy, if this applies. RISKS AND COMPLICATIONS Generally, anterior or posterior colporrhaphy is a safe procedure. However, as with any procedure, complications can occur. Possible complications include:  Infection.  Damage  to other organs during surgery.  Bleeding after surgery.  Problems urinating.  Problems from the anesthetic.  BEFORE THE PROCEDURE  Ask your health care provider about changing or stopping your regular medicines.  Do not eat or drink anything for at least 8 hours before the surgery.  If you smoke, do not smoke for at least 2 weeks before the surgery.  Make plans to have someone drive you home after your hospital stay. Also, arrange for someone to  help you with activities during recovery. PROCEDURE You may be given medicine to help you relax before the surgery (sedative). During the surgery you will be given medicine to make you sleep through the procedure (general anesthetic) or medicine to numb you from the waist down (spinal anesthetic). This medicine will be given through an intravenous (IV) access tube that is put into one of your veins. The procedure will vary depending on the type of repair:  Anterior repair. A cut (incision) is made in the midline section of the front part of the vaginal wall. A triangular-shaped piece of vaginal tissue is removed, and the stronger, healthier tissue is sewn together in order to support and suspend the bladder.  Posterior repair. An incision is made midline on the back wall of the vagina. A triangular portion of vaginal skin is removed to expose the muscle. Excess tissue is removed, and stronger, healthier muscle and ligament tissue is sewn together to support the rectum.  Anterior and posterior repair. Both procedures are done during the same surgery.  What to expect after the procedure You will be taken to a recovery area. Your blood pressure, pulse, breathing, and temperature (vital signs) will be monitored. This is done until you are stable. Then you will be transferred to a hospital room. After surgery, you will have a small rubber tube in place to drain your bladder (urinary catheter). This will be in place for 2 to 7 days or until your bladder is working properly on its own. The IV access tube will be removed in 1 to 3 days. You may have a gauze packing in your vagina to prevent bleeding. This will be removed 2 or 3 days after the surgery. You will likely need to stay in the hospital for 3 to 5 days. This information is not intended to replace advice given to you by your health care provider. Make sure you discuss any questions you have with your health care provider. Document Released: 06/06/2003  Document Revised: 08/22/2015 Document Reviewed: 08/05/2012 Elsevier Interactive Patient Education  2017 [employer] Anterior and Posterior Colporrhaphy, Care After Refer to this sheet in the next few weeks. These instructions provide you with information on caring for yourself after your procedure. Your health care provider may also give you more specific instructions. Your treatment has been planned according to current medical practices, but problems sometimes occur. Call your health care provider if you have any problems or questions after your procedure. Follow these instructions at home:  Take frequent rest periods throughout the day.  Only take over-the-counter or prescription medicines as directed by your health care provider.  Avoid strenuous activity such as heavy lifting (more than 10 pounds [4.5 kg]), pushing, and pulling until your health care provider says it is okay.  Take showers if your health care provider approves. Pat incisions dry. Do not rub incisions with a washcloth or towel. Do not take tub baths until your health care provider approves.  Wear compression stockings as directed by your health  care provider. These stockings help prevent blood clots from forming in your legs.  Talk with your health care provider about when you may return to work and your exercise routine.  Do not drive until your health care provider approves.  You may resume your normal diet. Eat a well-balanced diet.  Drink enough fluids to keep your urine clear or pale yellow.  Your normal bowel function should return. If you become constipated, you may: ? Take a mild laxative. ? Add fruit and bran to your diet. ? Drink more fluids.  Do not have sexual intercourse until permitted by your health care provider.  Follow up with your health care provider as directed. Contact a health care provider if: You have persistent nausea or vomiting. Get help right away if:  You have increased  bleeding (more than a small spot) from the vaginal area.  Your pain is not relieved with medicine or becomes worse.  You have redness, swelling, or increasing pain in the vaginal area.  You have abdominal pain.  You see pus coming from the wounds.  You develop a fever.  You have a foul smell coming from your vaginal area.  You develop light-headedness or you feel faint.  You have difficulty breathing. This information is not intended to replace advice given to you by your health care provider. Make sure you discuss any questions you have with your health care provider. Document Released: 10/02/2004 Document Revised: 08/22/2015 Document Reviewed: 08/05/2012 Elsevier Interactive Patient Education  2017 [employer]  General Anesthesia, Adult General anesthesia is the use of medicines to make a person "go to sleep" (be unconscious) for a medical procedure. General anesthesia is often recommended when a procedure:  Is long.  Requires you to be still or in an unusual position.  Is major and can cause you to lose blood.  Is impossible to do without general anesthesia.  The medicines used for general anesthesia are called general anesthetics. In addition to making you sleep, the medicines:  Prevent pain.  Control your blood pressure.  Relax your muscles.  Tell a health care provider about:  Any allergies you have.  All medicines you are taking, including vitamins, herbs, eye drops, creams, and over-the-counter medicines.  Any problems you or family members have had with anesthetic medicines.  Types of anesthetics you have had in the past.  Any bleeding disorders you have.  Any surgeries you have had.  Any medical conditions you have.  Any history of heart or lung conditions, such as heart failure, sleep apnea, or chronic obstructive pulmonary disease (COPD).  Whether you are pregnant or may be pregnant.  Whether you use tobacco, alcohol, marijuana, or street  drugs.  Any history of [employer].  Any history of depression or anxiety. What are the risks? Generally, this is a safe procedure. However, problems may occur, including:  Allergic reaction to anesthetics.  Lung and heart problems.  Inhaling food or liquids from your stomach into your lungs (aspiration).  Injury to nerves.  Waking up during your procedure and being unable to move (rare).  Extreme agitation or a state of mental confusion (delirium) when you wake up from the anesthetic.  Air in the bloodstream, which can lead to stroke.  These problems are more likely to develop if you are having a major surgery or if you have an advanced medical condition. You can prevent some of these complications by answering all of your health care provider's questions thoroughly and by following all pre-procedure  instructions. General anesthesia can cause side effects, including:  Nausea or vomiting  A sore throat from the breathing tube.  Feeling cold or shivery.  Feeling tired, washed out, or achy.  Sleepiness or drowsiness.  Confusion or agitation.  What happens before the procedure? Staying hydrated Follow instructions from your health care provider about hydration, which may include:  Up to 2 hours before the procedure - you may continue to drink clear liquids, such as water, clear fruit juice, black coffee, and plain tea.  Eating and drinking restrictions Follow instructions from your health care provider about eating and drinking, which may include:  8 hours before the procedure - stop eating heavy meals or foods such as meat, fried foods, or fatty foods.  6 hours before the procedure - stop eating light meals or foods, such as toast or cereal.  6 hours before the procedure - stop drinking milk or drinks that contain milk.  2 hours before the procedure - stop drinking clear liquids.  Medicines  Ask your health care provider about: ? Changing or stopping your  regular medicines. This is especially important if you are taking diabetes medicines or blood thinners. ? Taking medicines such as aspirin and ibuprofen. These medicines can thin your blood. Do not take these medicines before your procedure if your health care provider instructs you not to. ? Taking new dietary supplements or medicines. Do not take these during the week before your procedure unless your health care provider approves them.  If you are told to take a medicine or to continue taking a medicine on the day of the procedure, take the medicine with sips of water. General instructions   Ask if you will be going home the same day, the following day, or after a longer hospital stay. ? Plan to have someone take you home. ? Plan to have someone stay with you for the first 24 hours after you leave the hospital or clinic.  For 3-6 weeks before the procedure, try not to use any tobacco products, such as cigarettes, chewing tobacco, and e-cigarettes.  You may brush your teeth on the morning of the procedure, but make sure to spit out the toothpaste. What happens during the procedure?  You will be given anesthetics through a mask and through an IV tube in one of your veins.  You may receive medicine to help you relax (sedative).  As soon as you are asleep, a breathing tube may be used to help you breathe.  An anesthesia specialist will stay with you throughout the procedure. He or she will help keep you comfortable and safe by continuing to give you medicines and adjusting the amount of medicine that you get. He or she will also watch your blood pressure, pulse, and oxygen levels to make sure that the anesthetics do not cause any problems.  If a breathing tube was used to help you breathe, it will be removed before you wake up. The procedure may vary among health care providers and hospitals. What happens after the procedure?  You will wake up, often slowly, after the procedure is  complete, usually in a recovery area.  Your blood pressure, heart rate, breathing rate, and blood oxygen level will be monitored until the medicines you were given have worn off.  You may be given medicine to help you calm down if you feel anxious or agitated.  If you will be going home the same day, your health care provider may check to make sure you  can stand, drink, and urinate.  Your health care providers will treat your pain and side effects before you go home.  Do not drive for 24 hours if you received a sedative.  You may: ? Feel nauseous and vomit. ? Have a sore throat. ? Have mental slowness. ? Feel cold or shivery. ? Feel sleepy. ? Feel tired. ? Feel sore or achy, even in parts of your body where you did not have surgery. This information is not intended to replace advice given to you by your health care provider. Make sure you discuss any questions you have with your health care provider. Document Released: 06/23/2007 Document Revised: 08/27/2015 Document Reviewed: 02/28/2015 Elsevier Interactive Patient Education  2018 [employer]. General Anesthesia, Adult, Care After These instructions provide you with information about caring for yourself after your procedure. Your health care provider may also give you more specific instructions. Your treatment has been planned according to current medical practices, but problems sometimes occur. Call your health care provider if you have any problems or questions after your procedure. What can I expect after the procedure? After the procedure, it is common to have:  Vomiting.  A sore throat.  Mental slowness.  It is common to feel:  Nauseous.  Cold or shivery.  Sleepy.  Tired.  Sore or achy, even in parts of your body where you did not have surgery.  Follow these instructions at home: For at least 24 hours after the procedure:  Do not: ? Participate in activities where you could fall or become  injured. ? Drive. ? Use heavy machinery. ? Drink alcohol. ? Take sleeping pills or medicines that cause drowsiness. ? Make important decisions or sign legal documents. ? Take care of children on your own.  Rest. Eating and drinking  If you vomit, drink water, juice, or soup when you can drink without vomiting.  Drink enough fluid to keep your urine clear or pale yellow.  Make sure you have little or no nausea before eating solid foods.  Follow the diet recommended by your health care provider. General instructions  Have a responsible adult stay with you until you are awake and alert.  Return to your normal activities as told by your health care provider. Ask your health care provider what activities are safe for you.  Take over-the-counter and prescription medicines only as told by your health care provider.  If you smoke, do not smoke without supervision.  Keep all follow-up visits as told by your health care provider. This is important. Contact a health care provider if:  You continue to have nausea or vomiting at home, and medicines are not helpful.  You cannot drink fluids or start eating again.  You cannot urinate after 8-12 hours.  You develop a skin rash.  You have fever.  You have increasing redness at the site of your procedure. Get help right away if:  You have difficulty breathing.  You have chest pain.  You have unexpected bleeding.  You feel that you are having a life-threatening or urgent problem. This information is not intended to replace advice given to you by your health care provider. Make sure you discuss any questions you have with your health care provider. Document Released: 06/22/2000 Document Revised: 08/19/2015 Document Reviewed: 02/28/2015 Elsevier Interactive Patient Education  [employer].

## 2016-12-24 ENCOUNTER — Other Ambulatory Visit (INDEPENDENT_AMBULATORY_CARE_PROVIDER_SITE_OTHER): Payer: Self-pay | Admitting: *Deleted

## 2016-12-24 ENCOUNTER — Encounter (HOSPITAL_COMMUNITY)
Admission: RE | Admit: 2016-12-24 | Discharge: 2016-12-24 | Disposition: A | Discharge status: Home / Self Care | Payer: Medicare Other | Source: Ambulatory Visit | Attending: Obstetrics and Gynecology | Admitting: Obstetrics and Gynecology

## 2016-12-24 ENCOUNTER — Encounter (INDEPENDENT_AMBULATORY_CARE_PROVIDER_SITE_OTHER): Payer: Self-pay | Admitting: *Deleted

## 2016-12-24 ENCOUNTER — Other Ambulatory Visit: Payer: Self-pay | Admitting: Obstetrics and Gynecology

## 2016-12-24 ENCOUNTER — Encounter (HOSPITAL_COMMUNITY): Payer: Self-pay

## 2016-12-24 DIAGNOSIS — K838 Other specified diseases of biliary tract: Secondary | ICD-10-CM | POA: Insufficient documentation

## 2016-12-24 DIAGNOSIS — I10 Essential (primary) hypertension: Secondary | ICD-10-CM | POA: Insufficient documentation

## 2016-12-24 DIAGNOSIS — Z96659 Presence of unspecified artificial knee joint: Secondary | ICD-10-CM | POA: Insufficient documentation

## 2016-12-24 DIAGNOSIS — N810 Urethrocele: Secondary | ICD-10-CM | POA: Insufficient documentation

## 2016-12-24 DIAGNOSIS — Z01812 Encounter for preprocedural laboratory examination: Secondary | ICD-10-CM | POA: Diagnosis not present

## 2016-12-24 DIAGNOSIS — Z01810 Encounter for preprocedural cardiovascular examination: Secondary | ICD-10-CM | POA: Insufficient documentation

## 2016-12-24 DIAGNOSIS — E7800 Pure hypercholesterolemia, unspecified: Secondary | ICD-10-CM | POA: Diagnosis not present

## 2016-12-24 DIAGNOSIS — N393 Stress incontinence (female) (male): Secondary | ICD-10-CM | POA: Diagnosis not present

## 2016-12-24 DIAGNOSIS — K831 Obstruction of bile duct: Secondary | ICD-10-CM

## 2016-12-24 HISTORY — DX: Diverticulosis of intestine, part unspecified, without perforation or abscess without bleeding: K57.90

## 2016-12-24 HISTORY — DX: Restless legs syndrome: G25.81

## 2016-12-24 HISTORY — DX: Gastro-esophageal reflux disease without esophagitis: K21.9

## 2016-12-28 NOTE — H&P (Signed)
Patient ID: Gloria Sanchez, female   DOB: 1941-01-20, 76 y.o.   MRN: [id number] Preoperative History and Physical  Gloria Sanchez is a 76 y.o. G2P2 here for surgical management of a grade 3 cystocele and grade 3 rectocele. No significant preoperative concerns.  Proposed surgery: Anterior and Posterior Repair      Past Medical History:  Diagnosis Date  . Allergic rhinitis   . Common bile duct dilation 05/22/2016  . DM (diabetes mellitus) (HCC)   . Essential hypertension 05/22/2016  . High cholesterol 05/22/2016  . Hyperlipidemia   . Hypertension         Past Surgical History:  Procedure Laterality Date  . CHOLECYSTECTOMY    . complete hysterectomy    . REPLACEMENT TOTAL KNEE                     OB History  Gravida Para Term Preterm AB Living  SAB TAB Ectopic Multiple Live Births               # Outcome Date GA Lbr Len/2nd Weight Sex Delivery Anes PTL Lv  2 Para           1 Para             Patient denies any other pertinent gynecologic issues.         Current Outpatient Prescriptions on File Prior to Visit  Medication Sig Dispense Refill  . amLODipine (NORVASC) 5 MG tablet Take 5 mg by mouth daily.    . clonazePAM (KLONOPIN) 0.5 MG tablet Take 0.5 mg by mouth 2 (two) times daily as needed for anxiety.    . gabapentin (NEURONTIN) 300 MG capsule Take 1 capsule (300 mg total) by mouth at bedtime. 90 capsule 5  . losartan-hydrochlorothiazide (HYZAAR) 100-25 MG tablet Take 1 tablet by mouth daily.    . metFORMIN (GLUMETZA) 500 MG (MOD) 24 hr tablet Take 500 mg by mouth daily with breakfast.      . pantoprazole (PROTONIX) 40 MG tablet Take 40 mg by mouth daily.    . simvastatin (ZOCOR) 20 MG tablet Take 20 mg by mouth at bedtime.       No current facility-administered medications on file prior to visit.    No Known Allergies  Social History:   reports that she has never smoked. She has never used  smokeless tobacco. She reports that she does not drink alcohol or use drugs.       Family History  Problem Relation Age of Onset  . Cancer Sister   . Diabetes Sister     Review of Systems: Noncontributory  PHYSICAL EXAM: Blood pressure (!) 142/70, pulse (!) 58, weight 182 lb 3.2 oz (82.6 kg). General appearance - alert, well appearing, and in no distress Chest - clear to auscultation, no wheezes, rales or rhonchi, symmetric air entry Heart - normal rate and regular rhythm Abdomen - soft, nontender, nondistended, no masses or organomegaly  Pelvic - examination I personally performed the services described in this documentation, which was SCRIBED in my presence. The recorded information has been reviewed and considered accurate. It has been edited as necessary during review. Tilda Burrow, MD   Extremities - peripheral pulses normal, no pedal edema, no clubbing or cyanosis  Post resisdual minimal by u/s   Labs: Hemoccult negative CBC Latest Ref Rng & Units 12/07/2016 06/25/2016  WBC 3.4 - 10.8 x10E3/uL 8.0 7.8  Hemoglobin 11.1 - 15.9 g/dL 16.1 11.4(L)  Hematocrit 34.0 - 46.6 % 36.8 33.9(L)  Platelets 150 - 379 x10E3/uL 273 258   CMP Latest Ref Rng & Units 12/07/2016 11/12/2016 06/25/2016  Glucose 65 - 99 mg/dL 096(E) - -  BUN 8 - 27 mg/dL 13 - -  Creatinine 4.54 - 1.00 mg/dL 0.98 1.19 -  Sodium 147 - 144 mmol/L 144 - -  Potassium 3.5 - 5.2 mmol/L 4.2 - -  Chloride 96 - 106 mmol/L 99 - -  CO2 20 - 29 mmol/L 26 - -  Calcium 8.7 - 10.3 mg/dL 82.9 - -  Total Protein 6.1 - 8.1 g/dL - - 6.8  Total Bilirubin 0.2 - 1.2 mg/dL - - 0.7  Alkaline Phos 33 - 130 U/L - - 53  AST 10 - 35 U/L - - 18  ALT 6 - 29 U/L - - 11      Imaging Studies:  ImagingResults  Mr 3d Recon At Scanner  Result Date: 11/12/2016 CLINICAL DATA:  Common bile duct dilatation/ obstruction follow up. EXAM: MRI ABDOMEN WITHOUT AND WITH CONTRAST (INCLUDING MRCP) TECHNIQUE: Multiplanar  multisequence MR imaging of the abdomen was performed both before and after the administration of intravenous contrast. Heavily T2-weighted images of the biliary and pancreatic ducts were obtained, and three-dimensional MRCP images were rendered by post processing. CONTRAST:  15mL MULTIHANCE GADOBENATE DIMEGLUMINE 529 MG/ML IV SOLN COMPARISON:  Multiple exams, including 05/26/2016 FINDINGS: Despite efforts by the technologist and patient, motion artifact is present on today's exam and could not be eliminated. This reduces exam sensitivity and specificity. The previous accentuated enhancement along the ampulla is no longer seen. Lower chest: Mild cardiomegaly. Hepatobiliary: Minimal intrahepatic biliary dilatation is present. Common hepatic duct 1.9 cm diameter. Common bile duct 1.1 cm. On some images such as image 55/6, there is an oval-shaped T2 signal hypointensity measuring about 0.8 by 0.5 cm just anterior to the distal margin of the CBD, but this is poorly correlated on other imaging sequences is specially those affected by motion artifact. I am skeptical that this is actually within the duct. The possibility of a small periampullary duodenal diverticulum is raised as a potential cause given the in constant appearance the distal CBD appears to taper in a conical fashion on image 20/4. There is no calcification in this portion of the pancreas on 02/15/2017. No significant abnormal focal enhancement in the liver. Pancreas: There is are several tiny slightly dilated side ducts but otherwise the pancreas appears normal. No dilatation of the dorsal pancreatic duct. No abnormal pancreatic enhancement is identified. Spleen:  Unremarkable Stomach/Bowel: Unremarkable Vascular/Lymphatic:  Unremarkable Other:  No supplemental non-categorized findings. Musculoskeletal: Thoracic and lumbar spondylosis and degenerative disc disease. IMPRESSION: 1. Dilated extrahepatic biliary tree without an obvious cause. Conical tapering  in the vicinity of the ampulla without abnormal enhancement in this region. There is a tiny low signal intensity structure just anterior to the distal CBD along the pancreas for, without abnormal enhancement, an without calcification in this vicinity on the prior CT scan, eye speculated this could be a tiny periampullary duodenal diverticulum or a new punctate calcification in the pancreatic head. The lack of abnormal enhancement in this vicinity argues against a neoplastic process, and this tiny the structure does not appear to be intraluminal within the CBD. 2. Mild cardiomegaly. 3. Several slightly dilated pancreatic side ducts, possibly the result of prior pancreatitis. 4. Thoracolumbar spondylosis and degenerative disc disease. Electronically Signed   By: Gaylyn Rong  M.D.   On: 11/12/2016 12:00   Mr Abdomen Mrcp Vivien Rossetti Contast  Result Date: 11/12/2016 CLINICAL DATA:  Common bile duct dilatation/ obstruction follow up. EXAM: MRI ABDOMEN WITHOUT AND WITH CONTRAST (INCLUDING MRCP) TECHNIQUE: Multiplanar multisequence MR imaging of the abdomen was performed both before and after the administration of intravenous contrast. Heavily T2-weighted images of the biliary and pancreatic ducts were obtained, and three-dimensional MRCP images were rendered by post processing. CONTRAST:  15mL MULTIHANCE GADOBENATE DIMEGLUMINE 529 MG/ML IV SOLN COMPARISON:  Multiple exams, including 05/26/2016 FINDINGS: Despite efforts by the technologist and patient, motion artifact is present on today's exam and could not be eliminated. This reduces exam sensitivity and specificity. The previous accentuated enhancement along the ampulla is no longer seen. Lower chest: Mild cardiomegaly. Hepatobiliary: Minimal intrahepatic biliary dilatation is present. Common hepatic duct 1.9 cm diameter. Common bile duct 1.1 cm. On some images such as image 55/6, there is an oval-shaped T2 signal hypointensity measuring about 0.8 by 0.5 cm just  anterior to the distal margin of the CBD, but this is poorly correlated on other imaging sequences is specially those affected by motion artifact. I am skeptical that this is actually within the duct. The possibility of a small periampullary duodenal diverticulum is raised as a potential cause given the in constant appearance the distal CBD appears to taper in a conical fashion on image 20/4. There is no calcification in this portion of the pancreas on 02/15/2017. No significant abnormal focal enhancement in the liver. Pancreas: There is are several tiny slightly dilated side ducts but otherwise the pancreas appears normal. No dilatation of the dorsal pancreatic duct. No abnormal pancreatic enhancement is identified. Spleen:  Unremarkable Stomach/Bowel: Unremarkable Vascular/Lymphatic:  Unremarkable Other:  No supplemental non-categorized findings. Musculoskeletal: Thoracic and lumbar spondylosis and degenerative disc disease. IMPRESSION: 1. Dilated extrahepatic biliary tree without an obvious cause. Conical tapering in the vicinity of the ampulla without abnormal enhancement in this region. There is a tiny low signal intensity structure just anterior to the distal CBD along the pancreas for, without abnormal enhancement, an without calcification in this vicinity on the prior CT scan, eye speculated this could be a tiny periampullary duodenal diverticulum or a new punctate calcification in the pancreatic head. The lack of abnormal enhancement in this vicinity argues against a neoplastic process, and this tiny the structure does not appear to be intraluminal within the CBD. 2. Mild cardiomegaly. 3. Several slightly dilated pancreatic side ducts, possibly the result of prior pancreatitis. 4. Thoracolumbar spondylosis and degenerative disc disease. Electronically Signed   By: Gaylyn Rong M.D.   On: 11/12/2016 12:00     Assessment: 1. Grade 3 cystocele 2. Grade 3 rectocele 3. Hx prior Hysterectomy 4.  Obesity      Patient Active Problem List   Diagnosis Date Noted  . SUI (stress urinary incontinence, female) 12/04/2016  . Baden-Walker grade 3 cystocele 11/18/2016  . Essential hypertension 05/22/2016  . High cholesterol 05/22/2016  . Common bile duct dilation 05/22/2016  . S/P total knee replacement 03/04/2011  . KNEE PAIN 10/24/2007  . TOTAL KNEE FOLLOW-UP 10/24/2007    Plan: Patient will undergo surgical management with Anterior and Posterior Repair.     .mec 12/07/2016 9:24 AM

## 2016-12-29 ENCOUNTER — Observation Stay (HOSPITAL_COMMUNITY)
Admission: RE | Admit: 2016-12-29 | Discharge: 2016-12-30 | Disposition: A | Discharge status: Home / Self Care | Payer: Medicare Other | Source: Ambulatory Visit | Attending: Obstetrics and Gynecology | Admitting: Obstetrics and Gynecology

## 2016-12-29 ENCOUNTER — Ambulatory Visit (HOSPITAL_COMMUNITY): Payer: Medicare Other | Admitting: Anesthesiology

## 2016-12-29 ENCOUNTER — Encounter (HOSPITAL_COMMUNITY)
Admission: RE | Disposition: A | Discharge status: Home / Self Care | Payer: Self-pay | Source: Ambulatory Visit | Attending: Obstetrics and Gynecology

## 2016-12-29 ENCOUNTER — Encounter (HOSPITAL_COMMUNITY): Payer: Self-pay | Admitting: *Deleted

## 2016-12-29 DIAGNOSIS — I10 Essential (primary) hypertension: Secondary | ICD-10-CM | POA: Diagnosis not present

## 2016-12-29 DIAGNOSIS — Z79899 Other long term (current) drug therapy: Secondary | ICD-10-CM | POA: Diagnosis not present

## 2016-12-29 DIAGNOSIS — Z96659 Presence of unspecified artificial knee joint: Secondary | ICD-10-CM | POA: Diagnosis not present

## 2016-12-29 DIAGNOSIS — Z23 Encounter for immunization: Secondary | ICD-10-CM | POA: Insufficient documentation

## 2016-12-29 DIAGNOSIS — Z809 Family history of malignant neoplasm, unspecified: Secondary | ICD-10-CM | POA: Diagnosis not present

## 2016-12-29 DIAGNOSIS — E7800 Pure hypercholesterolemia, unspecified: Secondary | ICD-10-CM | POA: Diagnosis not present

## 2016-12-29 DIAGNOSIS — N816 Rectocele: Secondary | ICD-10-CM | POA: Diagnosis not present

## 2016-12-29 DIAGNOSIS — Z833 Family history of diabetes mellitus: Secondary | ICD-10-CM | POA: Insufficient documentation

## 2016-12-29 DIAGNOSIS — Z7984 Long term (current) use of oral hypoglycemic drugs: Secondary | ICD-10-CM | POA: Insufficient documentation

## 2016-12-29 DIAGNOSIS — N8110 Cystocele, unspecified: Secondary | ICD-10-CM | POA: Diagnosis not present

## 2016-12-29 DIAGNOSIS — E785 Hyperlipidemia, unspecified: Secondary | ICD-10-CM | POA: Insufficient documentation

## 2016-12-29 DIAGNOSIS — E119 Type 2 diabetes mellitus without complications: Secondary | ICD-10-CM | POA: Insufficient documentation

## 2016-12-29 HISTORY — PX: ANTERIOR AND POSTERIOR REPAIR: SHX5121

## 2016-12-29 LAB — GLUCOSE, CAPILLARY
GLUCOSE-CAPILLARY: 121 mg/dL — AB (ref 65–99)
Glucose-Capillary: 106 mg/dL — ABNORMAL HIGH (ref 65–99)
Glucose-Capillary: 108 mg/dL — ABNORMAL HIGH (ref 65–99)
Glucose-Capillary: 97 mg/dL (ref 65–99)

## 2016-12-29 SURGERY — ANTERIOR (CYSTOCELE) AND POSTERIOR REPAIR (RECTOCELE)
Anesthesia: Spinal

## 2016-12-29 MED ORDER — SODIUM CHLORIDE 0.9 % IV SOLN
INTRAVENOUS | Status: DC
  Administered 2016-12-29 – 2016-12-30 (×3): via INTRAVENOUS

## 2016-12-29 MED ORDER — HYDROMORPHONE HCL 1 MG/ML IJ SOLN
1.0000 mg | INTRAMUSCULAR | Status: DC | PRN
  Administered 2016-12-30 (×2): 1 mg via INTRAVENOUS
  Filled 2016-12-29 (×2): qty 1

## 2016-12-29 MED ORDER — BUPIVACAINE-EPINEPHRINE (PF) 0.5% -1:200000 IJ SOLN
INTRAMUSCULAR | Status: AC
  Filled 2016-12-29: qty 30

## 2016-12-29 MED ORDER — KETOROLAC TROMETHAMINE 30 MG/ML IJ SOLN
15.0000 mg | Freq: Four times a day (QID) | INTRAMUSCULAR | Status: AC
  Administered 2016-12-29 (×2): 15 mg via INTRAVENOUS
  Filled 2016-12-29 (×2): qty 1

## 2016-12-29 MED ORDER — FENTANYL CITRATE (PF) 100 MCG/2ML IJ SOLN
INTRAMUSCULAR | Status: DC | PRN
  Administered 2016-12-29: 20 ug via INTRATHECAL

## 2016-12-29 MED ORDER — 0.9 % SODIUM CHLORIDE (POUR BTL) OPTIME
TOPICAL | Status: DC | PRN
  Administered 2016-12-29: 1000 mL

## 2016-12-29 MED ORDER — BUPIVACAINE-EPINEPHRINE 0.5% -1:200000 IJ SOLN
INTRAMUSCULAR | Status: DC | PRN
  Administered 2016-12-29: 18 mL
  Administered 2016-12-29: 12 mL

## 2016-12-29 MED ORDER — CEFAZOLIN SODIUM-DEXTROSE 2-4 GM/100ML-% IV SOLN
2.0000 g | INTRAVENOUS | Status: AC
  Administered 2016-12-29: 2 g via INTRAVENOUS

## 2016-12-29 MED ORDER — FENTANYL CITRATE (PF) 100 MCG/2ML IJ SOLN
INTRAMUSCULAR | Status: AC
  Filled 2016-12-29: qty 2

## 2016-12-29 MED ORDER — PROMETHAZINE HCL 25 MG/ML IJ SOLN: 12.5000 mg | Freq: Four times a day (QID) | INTRAMUSCULAR | Status: DC | PRN

## 2016-12-29 MED ORDER — FENTANYL CITRATE (PF) 100 MCG/2ML IJ SOLN: 25.0000 ug | INTRAMUSCULAR | Status: DC | PRN

## 2016-12-29 MED ORDER — LOSARTAN POTASSIUM 50 MG PO TABS
100.0000 mg | ORAL_TABLET | Freq: Every day | ORAL | Status: DC
  Administered 2016-12-30: 100 mg via ORAL
  Filled 2016-12-29: qty 2

## 2016-12-29 MED ORDER — GABAPENTIN 300 MG PO CAPS
300.0000 mg | ORAL_CAPSULE | Freq: Every day | ORAL | Status: DC
  Administered 2016-12-29: 300 mg via ORAL
  Filled 2016-12-29: qty 1

## 2016-12-29 MED ORDER — SIMVASTATIN 20 MG PO TABS
20.0000 mg | ORAL_TABLET | Freq: Every day | ORAL | Status: DC
  Administered 2016-12-29: 20 mg via ORAL
  Filled 2016-12-29: qty 1

## 2016-12-29 MED ORDER — MIDAZOLAM HCL 2 MG/2ML IJ SOLN
INTRAMUSCULAR | Status: AC
  Filled 2016-12-29: qty 2

## 2016-12-29 MED ORDER — EPHEDRINE SULFATE 50 MG/ML IJ SOLN
INTRAMUSCULAR | Status: AC
  Filled 2016-12-29: qty 1

## 2016-12-29 MED ORDER — KETOROLAC TROMETHAMINE 30 MG/ML IJ SOLN: 30.0000 mg | Freq: Four times a day (QID) | INTRAMUSCULAR | Status: DC

## 2016-12-29 MED ORDER — ONDANSETRON HCL 4 MG/2ML IJ SOLN: 4.0000 mg | Freq: Four times a day (QID) | INTRAMUSCULAR | Status: DC | PRN

## 2016-12-29 MED ORDER — STERILE WATER FOR IRRIGATION IR SOLN
Status: DC | PRN
  Administered 2016-12-29: 1000 mL

## 2016-12-29 MED ORDER — MIDAZOLAM HCL 2 MG/2ML IJ SOLN
1.0000 mg | INTRAMUSCULAR | Status: DC
  Administered 2016-12-29: 2 mg via INTRAVENOUS

## 2016-12-29 MED ORDER — PANTOPRAZOLE SODIUM 40 MG PO TBEC
40.0000 mg | DELAYED_RELEASE_TABLET | Freq: Every day | ORAL | Status: DC
Start: 2016-12-30 — End: 2016-12-29

## 2016-12-29 MED ORDER — INSULIN ASPART 100 UNIT/ML ~~LOC~~ SOLN
0.0000 [IU] | Freq: Three times a day (TID) | SUBCUTANEOUS | Status: DC
  Administered 2016-12-29: 2 [IU] via SUBCUTANEOUS

## 2016-12-29 MED ORDER — BUPIVACAINE IN DEXTROSE 0.75-8.25 % IT SOLN
INTRATHECAL | Status: AC
  Filled 2016-12-29: qty 2

## 2016-12-29 MED ORDER — LOSARTAN POTASSIUM-HCTZ 100-25 MG PO TABS: 1.0000 | ORAL_TABLET | ORAL | Status: DC

## 2016-12-29 MED ORDER — METFORMIN HCL ER 500 MG PO TB24
500.0000 mg | ORAL_TABLET | Freq: Every day | ORAL | Status: DC
  Administered 2016-12-29: 500 mg via ORAL
  Filled 2016-12-29: qty 1

## 2016-12-29 MED ORDER — OXYCODONE-ACETAMINOPHEN 5-325 MG PO TABS: 1.0000 | ORAL_TABLET | ORAL | Status: DC | PRN

## 2016-12-29 MED ORDER — PROPOFOL 500 MG/50ML IV EMUL
INTRAVENOUS | Status: DC | PRN
  Administered 2016-12-29: 75 ug/kg/min via INTRAVENOUS

## 2016-12-29 MED ORDER — AMLODIPINE BESYLATE 5 MG PO TABS
5.0000 mg | ORAL_TABLET | Freq: Every day | ORAL | Status: DC
  Administered 2016-12-30: 5 mg via ORAL
  Filled 2016-12-29: qty 1

## 2016-12-29 MED ORDER — FENTANYL CITRATE (PF) 100 MCG/2ML IJ SOLN
25.0000 ug | Freq: Once | INTRAMUSCULAR | Status: AC
  Administered 2016-12-29: 25 ug via INTRAVENOUS

## 2016-12-29 MED ORDER — EPHEDRINE SULFATE 50 MG/ML IJ SOLN
INTRAMUSCULAR | Status: DC | PRN
  Administered 2016-12-29 (×6): 5 mg via INTRAVENOUS

## 2016-12-29 MED ORDER — LIDOCAINE HCL (PF) 1 % IJ SOLN
INTRAMUSCULAR | Status: AC
  Filled 2016-12-29: qty 5

## 2016-12-29 MED ORDER — INFLUENZA VAC SPLIT HIGH-DOSE 0.5 ML IM SUSY
0.5000 mL | PREFILLED_SYRINGE | INTRAMUSCULAR | Status: AC
  Administered 2016-12-30: 0.5 mL via INTRAMUSCULAR
  Filled 2016-12-29: qty 0.5

## 2016-12-29 MED ORDER — PHENYLEPHRINE 40 MCG/ML (10ML) SYRINGE FOR IV PUSH (FOR BLOOD PRESSURE SUPPORT)
PREFILLED_SYRINGE | INTRAVENOUS | Status: AC
  Filled 2016-12-29: qty 10

## 2016-12-29 MED ORDER — CEFAZOLIN SODIUM-DEXTROSE 2-4 GM/100ML-% IV SOLN
INTRAVENOUS | Status: AC
  Filled 2016-12-29: qty 100

## 2016-12-29 MED ORDER — ONDANSETRON HCL 4 MG PO TABS: 4.0000 mg | ORAL_TABLET | Freq: Four times a day (QID) | ORAL | Status: DC | PRN

## 2016-12-29 MED ORDER — PROPOFOL 10 MG/ML IV BOLUS
INTRAVENOUS | Status: AC
  Filled 2016-12-29: qty 40

## 2016-12-29 MED ORDER — LIDOCAINE HCL (PF) 1 % IJ SOLN
INTRAMUSCULAR | Status: DC | PRN
  Administered 2016-12-29: 40 mg

## 2016-12-29 MED ORDER — BUPIVACAINE IN DEXTROSE 0.75-8.25 % IT SOLN
INTRATHECAL | Status: DC | PRN
  Administered 2016-12-29: 2 mL via INTRATHECAL

## 2016-12-29 MED ORDER — HYDROCHLOROTHIAZIDE 25 MG PO TABS
25.0000 mg | ORAL_TABLET | Freq: Every day | ORAL | Status: DC
  Administered 2016-12-30: 25 mg via ORAL
  Filled 2016-12-29: qty 1

## 2016-12-29 MED ORDER — PHENYLEPHRINE HCL 10 MG/ML IJ SOLN
INTRAMUSCULAR | Status: DC | PRN
  Administered 2016-12-29: 100 ug via INTRAVENOUS
  Administered 2016-12-29: 80 ug via INTRAVENOUS
  Administered 2016-12-29: 40 ug via INTRAVENOUS
  Administered 2016-12-29: 100 ug via INTRAVENOUS
  Administered 2016-12-29 (×2): 40 ug via INTRAVENOUS

## 2016-12-29 MED ORDER — LACTATED RINGERS IV SOLN
INTRAVENOUS | Status: DC
  Administered 2016-12-29 (×3): via INTRAVENOUS

## 2016-12-29 MED ORDER — IBUPROFEN 600 MG PO TABS: 600.0000 mg | ORAL_TABLET | Freq: Four times a day (QID) | ORAL | Status: DC | PRN

## 2016-12-29 MED ORDER — CLONAZEPAM 0.5 MG PO TABS: 0.5000 mg | ORAL_TABLET | Freq: Two times a day (BID) | ORAL | Status: DC | PRN

## 2016-12-29 MED ORDER — PANTOPRAZOLE SODIUM 40 MG PO TBEC
40.0000 mg | DELAYED_RELEASE_TABLET | Freq: Every day | ORAL | Status: DC
  Administered 2016-12-30: 40 mg via ORAL
  Filled 2016-12-29: qty 1

## 2016-12-29 SURGICAL SUPPLY — 34 items
BAG HAMPER (MISCELLANEOUS) ×2 IMPLANT
CLOTH BEACON ORANGE TIMEOUT ST (SAFETY) ×2 IMPLANT
COVER LIGHT HANDLE STERIS (MISCELLANEOUS) ×4 IMPLANT
DECANTER SPIKE VIAL GLASS SM (MISCELLANEOUS) ×3 IMPLANT
DRAPE HALF SHEET 40X57 (DRAPES) ×1 IMPLANT
DRAPE PROXIMA HALF (DRAPES) ×2 IMPLANT
DRAPE STERI URO 9X17 APER PCH (DRAPES) ×2 IMPLANT
ELECT REM PT RETURN 9FT ADLT (ELECTROSURGICAL) ×2
ELECTRODE REM PT RTRN 9FT ADLT (ELECTROSURGICAL) ×1 IMPLANT
FORMALIN 10 PREFIL 120ML (MISCELLANEOUS) IMPLANT
GAUZE PACKING 2X5 YD STRL (GAUZE/BANDAGES/DRESSINGS) ×2 IMPLANT
GAUZE SPONGE 4X4 12PLY STRL (GAUZE/BANDAGES/DRESSINGS) ×1 IMPLANT
GLOVE BIOGEL PI IND STRL 7.0 (GLOVE) ×1 IMPLANT
GLOVE BIOGEL PI IND STRL 9 (GLOVE) ×1 IMPLANT
GLOVE BIOGEL PI INDICATOR 7.0 (GLOVE) ×3
GLOVE BIOGEL PI INDICATOR 9 (GLOVE) ×1
GLOVE ECLIPSE 6.5 STRL STRAW (GLOVE) ×1 IMPLANT
GLOVE ECLIPSE 7.0 STRL STRAW (GLOVE) ×1 IMPLANT
GLOVE ECLIPSE 9.0 STRL (GLOVE) ×4 IMPLANT
GOWN SPEC L3 XXLG W/TWL (GOWN DISPOSABLE) ×4 IMPLANT
GOWN STRL REUS W/TWL LRG LVL3 (GOWN DISPOSABLE) ×4 IMPLANT
KIT ROOM TURNOVER AP CYSTO (KITS) ×2 IMPLANT
MANIFOLD NEPTUNE II (INSTRUMENTS) ×2 IMPLANT
NDL HYPO 25X1 1.5 SAFETY (NEEDLE) ×1 IMPLANT
NEEDLE HYPO 25X1 1.5 SAFETY (NEEDLE) ×2 IMPLANT
NS IRRIG 1000ML POUR BTL (IV SOLUTION) ×2 IMPLANT
PACK PERI GYN (CUSTOM PROCEDURE TRAY) ×2 IMPLANT
PAD ARMBOARD 7.5X6 YLW CONV (MISCELLANEOUS) ×2 IMPLANT
SET BASIN LINEN APH (SET/KITS/TRAYS/PACK) ×2 IMPLANT
SUT CHROMIC 2 0 CT 1 (SUTURE) ×5 IMPLANT
SUT VIC AB 0 CT2 8-18 (SUTURE) ×3 IMPLANT
SYR BULB IRRIGATION 50ML (SYRINGE) ×1 IMPLANT
SYRINGE 10CC LL (SYRINGE) ×2 IMPLANT
TRAY FOLEY CATH SILVER 16FR (SET/KITS/TRAYS/PACK) ×2 IMPLANT

## 2016-12-29 NOTE — Anesthesia Preprocedure Evaluation (Signed)
Anesthesia Evaluation  Patient identified by MRN, date of birth, ID band Patient awake    Reviewed: Allergy & Precautions, NPO status , Patient's Chart, lab work & pertinent test results  Airway Mallampati: II  TM Distance: >3 FB     Dental  (+) Edentulous Upper, Partial Lower   Pulmonary neg pulmonary ROS,    breath sounds clear to auscultation       Cardiovascular hypertension, Pt. on medications  Rhythm:Regular Rate:Normal     Neuro/Psych PSYCHIATRIC DISORDERS Anxiety negative neurological ROS     GI/Hepatic GERD  Medicated and Controlled,  Endo/Other  diabetes, Type 2, Oral Hypoglycemic Agents  Renal/GU      Musculoskeletal   Abdominal   Peds  Hematology   Anesthesia Other Findings   Reproductive/Obstetrics                             Anesthesia Physical Anesthesia Plan  ASA: III  Anesthesia Plan: Spinal   Post-op Pain Management:    Induction:   PONV Risk Score and Plan:   Airway Management Planned: Simple Face Mask and Natural Airway  Additional Equipment:   Intra-op Plan:   Post-operative Plan:   Informed Consent: I have reviewed the patients History and Physical, chart, labs and discussed the procedure including the risks, benefits and alternatives for the proposed anesthesia with the patient or authorized representative who has indicated his/her understanding and acceptance.     Plan Discussed with: CRNA and Surgeon  Anesthesia Plan Comments: (Possible GOT discussed and pt agrees with plan.)        Anesthesia Quick Evaluation

## 2016-12-29 NOTE — Transfer of Care (Signed)
Immediate Anesthesia Transfer of Care Note  Patient: Gloria Sanchez  Procedure(s) Performed: ANTERIOR (CYSTOCELE) AND POSTERIOR REPAIR (RECTOCELE) (N/A )  Patient Location: PACU  Anesthesia Type:Spinal  Level of Consciousness: awake, alert , oriented and patient cooperative  Airway & Oxygen Therapy: Patient Spontanous Breathing and Patient connected to face mask oxygen  Post-op Assessment: Report given to RN and Post -op Vital signs reviewed and stable  Post vital signs: Reviewed and stable  Last Vitals:  Vitals:   12/29/16 0700 12/29/16 0715  BP: 121/63 108/63  Resp: (!) 29 18  Temp:    SpO2: 91% 94%    Last Pain:  Vitals:   12/29/16 0620  TempSrc: Oral      Patients Stated Pain Goal: 10 (12/29/16 0620)  Complications: No apparent anesthesia complications

## 2016-12-29 NOTE — Anesthesia Procedure Notes (Addendum)
Spinal  Patient location during procedure: OR Start time: 12/29/2016 7:34 AM End time: 12/29/2016 7:44 AM Staffing Anesthesiologist: Laurene Footman Performed: anesthesiologist  Preanesthetic Checklist Completed: patient identified, surgical consent, pre-op evaluation, timeout performed, IV checked, risks and benefits discussed and monitors and equipment checked Spinal Block Patient position: right lateral decubitus Prep: Betadine and site prepped and draped Patient monitoring: heart rate, cardiac monitor, continuous pulse ox and blood pressure Approach: midline Location: L3-4 Injection technique: single-shot Needle Needle type: Spinocan  Needle gauge: 22 G Needle length: 9 cm Additional Notes LOT: [id number]

## 2016-12-29 NOTE — Interval H&P Note (Signed)
History and Physical Interval Note:  12/29/2016 7:13 AM  Gloria Sanchez  has presented today for surgery, with the diagnosis of cystocele  The various methods of treatment have been discussed with the patient and family. After consideration of risks, benefits and other options for treatment, the patient has consented to  Procedure(s): ANTERIOR (CYSTOCELE) AND POSTERIOR REPAIR (RECTOCELE) (N/A) as a surgical intervention .  The patient's history has been reviewed, patient examined, no change in status, stable for surgery.  I have reviewed the patient's chart and labs.  Questions were answered to the patient's satisfaction. She has a normal EKG, has completed her Mag Citrate with results that stopped by 6 pm yesterday, has no other concerns.  The Daughter is present during final discussion this morning of planned surgery, and also has no further questions. Spinal Anesthesia and overnight observation planned . Tilda Burrow, MD     Tilda Burrow

## 2016-12-29 NOTE — Anesthesia Postprocedure Evaluation (Signed)
Anesthesia Post Note  Patient: Gloria Sanchez  Procedure(s) Performed: ANTERIOR (CYSTOCELE) AND POSTERIOR REPAIR (RECTOCELE) (N/A )  Patient location during evaluation: PACU Anesthesia Type: Spinal Level of consciousness: awake, awake and alert, oriented and patient cooperative Pain management: pain level controlled Vital Signs Assessment: post-procedure vital signs reviewed and stable Respiratory status: spontaneous breathing, nonlabored ventilation, respiratory function stable and patient connected to nasal cannula oxygen Cardiovascular status: stable Postop Assessment: no headache, no backache, spinal receding and no apparent nausea or vomiting Anesthetic complications: no     Last Vitals:  Vitals:   12/29/16 0700 12/29/16 0715  BP: 121/63 108/63  Resp: (!) 29 18  Temp:    SpO2: 91% 94%    Last Pain:  Vitals:   12/29/16 0620  TempSrc: Oral                 Vadis Slabach L

## 2016-12-29 NOTE — Op Note (Signed)
Please see the brief operative note for surgical details 

## 2016-12-29 NOTE — Brief Op Note (Signed)
12/29/2016  10:21 AM  PATIENT:  Gloria Sanchez  76 y.o. female  PRE-OPERATIVE DIAGNOSIS:  cystocele and pelvic relaxation  POST-OPERATIVE DIAGNOSIS:  cystocele and pelvic relaxation  PROCEDURE:  Procedure(s): ANTERIOR (CYSTOCELE) AND POSTERIOR REPAIR (RECTOCELE) (N/A)  SURGEON:  Surgeon(s) and Role:    Tilda Burrow, MD - Primary  PHYSICIAN ASSISTANT:   ASSISTANTS: Valetta Close RNFA   ANESTHESIA:   local and spinal  EBL:  Total I/O In: 1700 [I.V.:1700] Out: 25 [Blood:25]  BLOOD ADMINISTERED:none  DRAINS: none   LOCAL MEDICATIONS USED:  MARCAINE    and Amount: 30 ml  SPECIMEN:  No Specimen  DISPOSITION OF SPECIMEN:  N/A  COUNTS:  YES  TOURNIQUET:  * No tourniquets in log *  DICTATION: .Dragon Dictation  PLAN OF CARE: Admit for overnight observation  PATIENT DISPOSITION:  PACU - hemodynamically stable.   Delay start of Pharmacological VTE agent (>24hrs) due to surgical blood loss or risk of bleeding: not applicable Patient was taken to the operating room prepped and draped for vaginal procedure with legs in candycane leg support. Timeout was conducted and Ancef administered and procedure confirmed by surgical team. Foley catheter was inserted and 100 cc of clear urine obtained. The huge cystocele was infiltrated with 10cc of Marcaine and a midline incision made over the cystocele. The skin was dissected off of the bladder all the way up to within 2 cm of the vaginal cuff. Allis clamps and then placed on the dimples were the uterosacral ligaments attach the vaginal cuff. Sharp dissection went well on both sides. We stayed away from the trigone and did not dissect in this area. The very redundant vaginal epithelium was then measured and a series of horizontal mattress sutures placed 5 in the vaginal epithelium in such a way as to dramatically reduce the prolapse and descensus of the bladder. The horizontal mattress sutures were tied down with excellent improvement in  the bladder support. The redundant vaginal epithelium was trimmed. The vaginal epithelium was sutured with multiple sutures of 2-0 chromic and good approximation achieved. Posterior repair Posterior repair followed with infiltration of the perineal body with Marcaine with epinephrine 15 cc to 20 cc and then a dissection beneath the vaginal epithelium performed on each side up for a distance approximately 66-8 cm up the posterior vaginal wall. Using a double gloved index finger beneath the vaginal bed of bleeding were able do a digital rectal exam and identified the good supportive tissue that had that she had had a obstetric laceration to the left of the midline. The redundant tissue was noted to be thicker on the patient's right side and could be moved across the midline and cephalad and attached with 0 Vicryl sutures in such a way to rebuild the perineal body over the lower half of the vagina with good perineal support achieved. A second layer of horizontal mattress sutures were placed above the first layer at the lower 2 cm of the introitus to reduce introitus size if better perineal support. Vaginal epithelium was trimmed and discarded and interrupted horizontal mattress sutures used to close the vaginal epithelium. Betadine soaked vaginal packing was placed lightly in the vagina and patient to recovery room in stable condition

## 2016-12-30 ENCOUNTER — Encounter (HOSPITAL_COMMUNITY): Payer: Self-pay | Admitting: Obstetrics and Gynecology

## 2016-12-30 DIAGNOSIS — N814 Uterovaginal prolapse, unspecified: Secondary | ICD-10-CM

## 2016-12-30 DIAGNOSIS — N816 Rectocele: Secondary | ICD-10-CM | POA: Diagnosis not present

## 2016-12-30 LAB — CBC
HCT: 33.6 % — ABNORMAL LOW (ref 36.0–46.0)
HEMOGLOBIN: 11.3 g/dL — AB (ref 12.0–15.0)
MCH: 32.8 pg (ref 26.0–34.0)
MCHC: 33.6 g/dL (ref 30.0–36.0)
MCV: 97.4 fL (ref 78.0–100.0)
PLATELETS: 215 10*3/uL (ref 150–400)
RBC: 3.45 MIL/uL — AB (ref 3.87–5.11)
RDW: 13.2 % (ref 11.5–15.5)
WBC: 11 10*3/uL — ABNORMAL HIGH (ref 4.0–10.5)

## 2016-12-30 LAB — BASIC METABOLIC PANEL
ANION GAP: 6 (ref 5–15)
BUN: 8 mg/dL (ref 6–20)
CO2: 27 mmol/L (ref 22–32)
Calcium: 8.6 mg/dL — ABNORMAL LOW (ref 8.9–10.3)
Chloride: 103 mmol/L (ref 101–111)
Creatinine, Ser: 0.64 mg/dL (ref 0.44–1.00)
GFR calc Af Amer: >60 mL/min (ref >60)
Glucose, Bld: 105 mg/dL — ABNORMAL HIGH (ref 65–99)
POTASSIUM: 3.3 mmol/L — AB (ref 3.5–5.1)
SODIUM: 136 mmol/L (ref 135–145)

## 2016-12-30 LAB — GLUCOSE, CAPILLARY
GLUCOSE-CAPILLARY: 96 mg/dL (ref 65–99)
Glucose-Capillary: 105 mg/dL — ABNORMAL HIGH (ref 65–99)

## 2016-12-30 MED ORDER — POLYETHYLENE GLYCOL 3350 17 GM/SCOOP PO POWD: 17.0000 g | Freq: Every day | ORAL | 99 refills | Status: DC

## 2016-12-30 MED ORDER — IBUPROFEN 600 MG PO TABS: 600.0000 mg | ORAL_TABLET | Freq: Four times a day (QID) | ORAL | 0 refills | Status: DC | PRN

## 2016-12-30 NOTE — Addendum Note (Signed)
Addendum  created 12/30/16 0723 by Moshe Salisbury, CRNA   Sign clinical note

## 2016-12-30 NOTE — Discharge Instructions (Signed)
Anterior and Posterior Colporrhaphy, Care After °Refer to this sheet in the next few weeks. These instructions provide you with information on caring for yourself after your procedure. Your health care provider may also give you more specific instructions. Your treatment has been planned according to current medical practices, but problems sometimes occur. Call your health care provider if you have any problems or questions after your procedure. °Follow these instructions at home: °· Take frequent rest periods throughout the day. °· Only take over-the-counter or prescription medicines as directed by your health care provider. °· Avoid strenuous activity such as heavy lifting (more than 10 pounds [4.5 kg]), pushing, and pulling until your health care provider says it is okay. °· Take showers if your health care provider approves. Pat incisions dry. Do not rub incisions with a washcloth or towel. Do not take tub baths until your health care provider approves. °· Wear compression stockings as directed by your health care provider. These stockings help prevent blood clots from forming in your legs. °· Talk with your health care provider about when you may return to work and your exercise routine. °· Do not drive until your health care provider approves. °· You may resume your normal diet. Eat a well-balanced diet. °· Drink enough fluids to keep your urine clear or pale yellow. °· Your normal bowel function should return. If you become constipated, you may: °? Take a mild laxative. °? Add fruit and bran to your diet. °? Drink more fluids. °· Do not have sexual intercourse until permitted by your health care provider. °· Follow up with your health care provider as directed. °Contact a health care provider if: °You have persistent nausea or vomiting. °Get help right away if: °· You have increased bleeding (more than a small spot) from the vaginal area. °· Your pain is not relieved with medicine or becomes worse. °· You  have redness, swelling, or increasing pain in the vaginal area. °· You have abdominal pain. °· You see pus coming from the wounds. °· You develop a fever. °· You have a foul smell coming from your vaginal area. °· You develop light-headedness or you feel faint. °· You have difficulty breathing. °This information is not intended to replace advice given to you by your health care provider. Make sure you discuss any questions you have with your health care provider. °Document Released: 10/02/2004 Document Revised: 08/22/2015 Document Reviewed: 08/05/2012 °Elsevier Interactive Patient Education © 2017 [employer] ° ° °

## 2016-12-30 NOTE — Progress Notes (Signed)
Patient is to be discharged home and in stable condition. Patient's IV removed, WNL. Patient spontaneously voided after foley catheter removed. Patient given discharge instructions and verbalized understanding. Patient escorted out by staff via wheelchair.  Quita Skye, RN

## 2016-12-30 NOTE — Discharge Summary (Signed)
Physician Discharge Summary  Patient ID: Gloria Sanchez MRN: [id number] DOB/AGE: [birth year] 76 y.o.  Admit date: 12/29/2016 Discharge date: 12/30/2016  Admission Diagnoses:Grade 3 cystocele  Discharge Diagnoses:  Active Problems:   Pelvic organ prolapse quantification stage 3 cystocele   Discharged Condition: good  Hospital Course: This 76 year old female with grade 3 cystocele, with protrusion slightly through the introitus of a cystocele, status post hysterectomy with posterior relaxation of the perineum, but no problems with constipation or difficulty with defecation was admitted for anterior and posterior repair. Through same day surgery. She was observed overnight with overnight Foley catheter and vaginal packing. She remained afebrile with pain levels of 4 out of 10 at discharge. She will be discharged after voiding this morning, after packing and catheter removed. She has remained stable with blood sugars in the 90s, blood pressures in the 140s over 60s, good urinary output, and will be discharge this a.m.  Consults: None  Significant Diagnostic Studies: labs:  CBC Latest Ref Rng & Units 12/30/2016 12/07/2016 06/25/2016  WBC 4.0 - 10.5 K/uL 11.0(H) 8.0 7.8  Hemoglobin 12.0 - 15.0 g/dL 11.3(L) 12.6 11.4(L)  Hematocrit 36.0 - 46.0 % 33.6(L) 36.8 33.9(L)  Platelets 150 - 400 K/uL 215 273 258   BMP Latest Ref Rng & Units 12/30/2016 12/07/2016 11/12/2016  Glucose 65 - 99 mg/dL 295(J) 884(Z) -  BUN 6 - 20 mg/dL 8 13 -  Creatinine 6.60 - 1.00 mg/dL 6.30 1.60 1.09  BUN/Creat Ratio 12 - 28 - 18 -  Sodium 135 - 145 mmol/L 136 144 -  Potassium 3.5 - 5.1 mmol/L 3.3(L) 4.2 -  Chloride 101 - 111 mmol/L 103 99 -  CO2 22 - 32 mmol/L 27 26 -  Calcium 8.9 - 10.3 mg/dL 3.2(T) 55.7 -     Treatments: surgery: Anterior and posterior colporrhaphy ( A&P repair)  Discharge Exam: Blood pressure (!) 142/58, pulse 80, temperature 98.7 F (37.1 C), temperature source Oral, resp. rate 20, height   (1.575 m), weight 182 lb (82.6 kg), SpO2 96 %. General appearance: alert, cooperative, appears stated age and no distress Head: Normocephalic, without obvious abnormality, atraumatic GI: soft, non-tender; bowel sounds normal; no masses,  no organomegaly Pelvic: external genitalia normal and Vaginal packing discontinued minimal blood on Extremities: extremities normal, atraumatic, no cyanosis or edema and Homans sign is negative, no sign of DVT  Disposition:   Discharge Instructions    Call MD for:  severe uncontrolled pain    Complete by:  As directed    Call MD for:  temperature >100.4    Complete by:  As directed    Diet - low sodium heart healthy    Complete by:  As directed    Increase activity slowly    Complete by:  As directed    Lifting restrictions    Complete by:  As directed    Please do not lift anything that requires you to lock your breath and  strain for 2 weeks, until you're been seen and reexamined in our office Notify our office for temperature greater than 100 Notify her office for increasing pelvic pain Expect a light pink vaginal discharge for the first 2 weeks   Sexual Activity Restrictions    Complete by:  As directed    None     Allergies as of 12/30/2016   No Known Allergies     Medication List    TAKE these medications   amLODipine 5 MG tablet Commonly known as:  NORVASC  Take 5 mg by mouth daily.   clonazePAM 0.5 MG tablet Commonly known as:  KLONOPIN Take 0.5 mg by mouth 2 (two) times daily as needed for anxiety.   gabapentin 300 MG capsule Commonly known as:  NEURONTIN Take 1 capsule (300 mg total) by mouth at bedtime.   ibuprofen 600 MG tablet Commonly known as:  ADVIL,MOTRIN Take 1 tablet (600 mg total) by mouth every 6 (six) hours as needed (mild pain).   losartan-hydrochlorothiazide 100-25 MG tablet Commonly known as:  HYZAAR Take 1 tablet by mouth every morning.   metFORMIN 500 MG 24 hr tablet Commonly known as:   GLUCOPHAGE-XR Take 500 mg by mouth at bedtime.   pantoprazole 40 MG tablet Commonly known as:  PROTONIX Take 40 mg by mouth daily.   polyethylene glycol powder powder Commonly known as:  MIRALAX Take 17 g by mouth daily. To prevent constipation   polyethylene glycol powder powder Commonly known as:  MIRALAX Take 17 g by mouth daily. To prevent constipation   simvastatin 20 MG tablet Commonly known as:  ZOCOR Take 20 mg by mouth at bedtime.      Follow-up Information    Tilda Burrow, MD Follow up on 01/06/2017.   Specialties:  Obstetrics and Gynecology, Radiology Contact information: [address] [place] [address] [phone]           Signed: Tilda Burrow 12/30/2016, 8:21 AM

## 2016-12-30 NOTE — Anesthesia Postprocedure Evaluation (Signed)
Anesthesia Post Note  Patient: Gloria Sanchez  Procedure(s) Performed: ANTERIOR (CYSTOCELE) AND POSTERIOR REPAIR (RECTOCELE) (N/A )  Patient location during evaluation: Nursing Unit Anesthesia Type: Spinal Level of consciousness: awake and alert and oriented Pain management: pain level controlled Vital Signs Assessment: post-procedure vital signs reviewed and stable Respiratory status: spontaneous breathing Cardiovascular status: blood pressure returned to baseline and stable Postop Assessment: no apparent nausea or vomiting and adequate PO intake Anesthetic complications: no     Last Vitals:  Vitals:   12/30/16 0000 12/30/16 0400  BP: (!) 144/64 (!) 142/58  Pulse: 86 80  Resp: 18 20  Temp: 37.3 C 37.1 C  SpO2: 97% 96%    Last Pain:  Vitals:   12/30/16 0400  TempSrc: Oral  PainSc:                  Dannette Kinkaid

## 2017-01-06 ENCOUNTER — Encounter: Payer: Self-pay | Admitting: Obstetrics and Gynecology

## 2017-01-06 ENCOUNTER — Ambulatory Visit (INDEPENDENT_AMBULATORY_CARE_PROVIDER_SITE_OTHER): Payer: Medicare Other | Admitting: Obstetrics and Gynecology

## 2017-01-06 VITALS — BP 158/60 | HR 58 | Ht 61.0 in | Wt 184.6 lb

## 2017-01-06 DIAGNOSIS — Z09 Encounter for follow-up examination after completed treatment for conditions other than malignant neoplasm: Secondary | ICD-10-CM | POA: Insufficient documentation

## 2017-01-06 DIAGNOSIS — Z9889 Other specified postprocedural states: Secondary | ICD-10-CM

## 2017-01-06 NOTE — Progress Notes (Addendum)
   Subjective:  Gloria Sanchez is a 76 y.o. female now 1 weeks status post ANTERIOR (CYSTOCELE) AND POSTERIOR REPAIR (RECTOCELE) .     Review of Systems Negative except itching and some bleeding. Pt says sometimes she can't tell when she has to urinate. She uses the bathroom to urinate about 2-3 times during the day.And is advised to void every 2 hours    Diet:   Regular   Bowel movements : normal. Pt has been taking Miralax  The patient is not having any pain.  Objective:  BP (!) 158/60 (BP Location: Right Arm, Patient Position: Sitting, Cuff Size: Normal)   Pulse (!) 58   Ht  (1.549 m)   Wt 184 lb 9.6 oz (83.7 kg)   BMI 34.88 kg/m  General:Well developed, well nourished.  No acute distress. Pelvic Exam:    External Genitalia:  Normal.There is no bulging through the introitus. The lower portion of the cystocele repair does bulge slightly, and I have her cough there is immediate loss of urine. The patient reports that the incontinence with coughing and lifting is actually worse since the cystocele repair. Patient is reminded we talked about the possibility    Vagina: Normal    Cervix: Normal    Uterus: Normal    Adnexa/Bimanual: Normal Anterior support of the urethra is better toward the vaginal apex. There is slight rotational effect still noted from the lower portion of the repair. When she coughs she says immediate loss of urine Incision(s):   Healing well, no drainage, no erythema, no hernia, no swelling, no dehiscence,     Assessment:  Post-Op 1 weeks s/p ANTERIOR (CYSTOCELE) AND POSTERIOR REPAIR (RECTOCELE)    Stress urinary incontinence worsened by anatomic correction of cystocele  excellent posterior repair results Pt doing well postoperatively. Front wall support is slightly weak, some stress incontinence, posterior wall support is excellent   Plan:  1.Wound care discussed   2. current medications: Continue stool softener 3. Activity restrictions: Avoid heavy  lifting15 pound limit for now with avoidance of straining and avoidance of Valsalva maneuver 4. return to work: not applicable. 5. Follow up in 4 weeks.  6. SUI continues this severely may need referral for sling    By signing my name below, I, Izna Ahmed, attest that this documentation has been prepared under the direction and in the presence of Tilda Burrow, MD. Electronically Signed: Redge Gainer, Medical Scribe. 01/06/17. 10:11 AM.  I personally performed the services described in this documentation, which was SCRIBED in my presence. The recorded information has been reviewed and considered accurate. It has been edited as necessary during review. Tilda Burrow, MD

## 2017-01-26 ENCOUNTER — Telehealth: Payer: Self-pay | Admitting: Orthopedic Surgery

## 2017-01-26 NOTE — Telephone Encounter (Signed)
Refill request for Gabapentin 300 mgs.  Qty 90  With 5 Refills  Sig: Take 1 capsule (300 mg total) by mouth at bedtime.

## 2017-01-27 ENCOUNTER — Other Ambulatory Visit: Payer: Self-pay | Admitting: Orthopedic Surgery

## 2017-01-27 DIAGNOSIS — M545 Low back pain: Principal | ICD-10-CM

## 2017-01-27 DIAGNOSIS — G8929 Other chronic pain: Secondary | ICD-10-CM

## 2017-01-27 MED ORDER — GABAPENTIN 300 MG PO CAPS: 300.0000 mg | ORAL_CAPSULE | Freq: Every day | ORAL | 5 refills | Status: DC

## 2017-02-03 ENCOUNTER — Encounter: Payer: Self-pay | Admitting: Obstetrics and Gynecology

## 2017-02-03 ENCOUNTER — Ambulatory Visit: Payer: Medicare Other | Admitting: Obstetrics and Gynecology

## 2017-02-03 VITALS — BP 144/60 | HR 70 | Ht 61.5 in | Wt 188.8 lb

## 2017-02-03 DIAGNOSIS — Z9889 Other specified postprocedural states: Secondary | ICD-10-CM

## 2017-02-03 DIAGNOSIS — Z09 Encounter for follow-up examination after completed treatment for conditions other than malignant neoplasm: Secondary | ICD-10-CM

## 2017-02-03 NOTE — Patient Instructions (Signed)

## 2017-02-03 NOTE — Progress Notes (Signed)
Patient ID: Gloria Sanchez, female   DOB: Dec 07, 1940, 76 y.o.   MRN: [id number]    Subjective:  Gloria Sanchez is a 76 y.o. female now 5 weeks status post anterior (cystocele) and posterior repair (rectocele). Pt reports that she is still feeling mild discomfort. She has a knee brace on her left knee. She needs to have her knee replaced in the future.   Review of Systems Negative except mild discomfort    Diet:   normal   Bowel movements : normal. She adds that she no longer needs to strain.  Objective:  BP (!) 144/60 (BP Location: Right Arm, Patient Position: Sitting, Cuff Size: Normal)   Pulse 70   Ht 5' 1.5" (1.562 m)   Wt 188 lb 12.8 oz (85.6 kg)   BMI 35.10 kg/m  General:Well developed, well nourished.  No acute distress. Abdomen: Bowel sounds normal, soft, non-tender. Pelvic Exam:    External Genitalia:  Normal.    Vagina: Some residual cystocele but not protruding.     Cervix: Surgically removed       Uterus: Surgically removed    Adnexa/Bimanual: Normal  Incision(s):   Healing well, no drainage, no erythema, no hernia, no swelling, no dehiscence,   Discussion: Discussed with pt weight loss methods including food measurement and calorie counting using apps such as MyNetDiary or My Fitness Pal. Recommended the use of measuring cups and spoons to monitor serving sizes. Encouraged adequate daily water intake, especially prior to meals to eliminate overeating. Additionally encouraged pt to become more active by taking daily walks of at least 30 minute duration, join a local gym such as the [facility] or attend water aerobics classes. Also advised pt to use pedometer on smartphone or utilize a smartband such as FitBit to keep track of daily activity.   At end of discussion, pt had opportunity to ask questions and has no further questions at this time.   Specific discussion of lifestyle changes, behavioral modifications and healthy eating habits as noted above. Greater than 50% was spent  in counseling and coordination of care with the patient.  Total time greater than: 20 minutes.    Assessment:  Post-Op 5 weeks s/p anterior (cystocele) and posterior repair (rectocele). Healing well postoperatively.   Plan:  1.Wound care discussed  2. . current medications. 3. Activity restrictions: none 4. return to work: not applicable. 5. Follow up in 1 year or PRN. 6. Weight loss  By signing my name below, I, Diona BrownerJennifer Gorman, attest that this documentation has been prepared under the direction and in the presence of Tilda BurrowFerguson, Leotis Isham V, MD. Electronically Signed: Diona BrownerJennifer Gorman, Medical Scribe. 02/03/17. 11:24 AM.  I personally performed the services described in this documentation, which was SCRIBED in my presence. The recorded information has been reviewed and considered accurate. It has been edited as necessary during review. Tilda BurrowFERGUSON,Jazira Maloney V, MD

## 2017-04-14 ENCOUNTER — Ambulatory Visit: Payer: Medicare Other

## 2017-04-14 ENCOUNTER — Ambulatory Visit (INDEPENDENT_AMBULATORY_CARE_PROVIDER_SITE_OTHER): Payer: Medicare Other

## 2017-04-14 ENCOUNTER — Encounter: Payer: Self-pay | Admitting: Orthopedic Surgery

## 2017-04-14 ENCOUNTER — Ambulatory Visit: Payer: Medicare Other | Admitting: Orthopedic Surgery

## 2017-04-14 VITALS — BP 150/90 | HR 74 | Ht 61.5 in | Wt 186.0 lb

## 2017-04-14 DIAGNOSIS — Z96651 Presence of right artificial knee joint: Secondary | ICD-10-CM | POA: Diagnosis not present

## 2017-04-14 DIAGNOSIS — M25562 Pain in left knee: Secondary | ICD-10-CM

## 2017-04-14 DIAGNOSIS — M1712 Unilateral primary osteoarthritis, left knee: Secondary | ICD-10-CM | POA: Diagnosis not present

## 2017-04-14 NOTE — Patient Instructions (Signed)
You have decided to proceed with knee replacement surgery. You have decided not to continue with nonoperative measures such as but not limited to oral medication, weight loss, activity modification, physical therapy, bracing, or injection.  We will perform the procedure commonly known as total knee replacement. Some of the risks associated with knee replacement surgery include but are not limited to Bleeding Infection Swelling Stiffness Blood clot Pain that persists even after surgery  Infection is especially devastating complication of knee surgery although rare. If infection does occur your implant will usually have to be removed and several surgeries and antibiotics will be needed to eradicate the infection prior to performing a repeat replacement.   In some cases amputation is required to eradicate the infection. In other rare cases a knee fusion is needed    If you're not comfortable with these risks and would like to continue with nonoperative treatment please let Dr. Harrison know prior to your surgery.  

## 2017-04-14 NOTE — Progress Notes (Signed)
Progress Note   Patient ID: Gloria Sanchez, female   DOB: [birth year], 77 y.o.   MRN: [id number]  Chief Complaint  Patient presents with  . Knee Pain    left ready for total knee replacement  . Post-op Follow-up    right knee replacement [id number]     77 year old female status post right total knee 14 years ago has no complaints  She is however complaining of dull constant moderate to severe pain in her left knee with decreased ability to perform activities of daily living and would like to proceed with a left total knee replacement     Review of Systems  Constitutional: Negative for fever.  Respiratory: Negative.   Cardiovascular: Negative.   Skin: Negative.   Neurological: Negative for focal weakness.  Psychiatric/Behavioral: Negative.    Current Meds  Medication Sig  . amLODipine (NORVASC) 5 MG tablet Take 5 mg by mouth daily.  . clonazePAM (KLONOPIN) 0.5 MG tablet Take 0.5 mg at bedtime by mouth.   . gabapentin (NEURONTIN) 300 MG capsule Take 1 capsule (300 mg total) by mouth at bedtime.  Marland Kitchen. losartan-hydrochlorothiazide (HYZAAR) 100-25 MG tablet Take 1 tablet by mouth every morning.   . metFORMIN (GLUCOPHAGE-XR) 500 MG 24 hr tablet Take 500 mg by mouth at bedtime.  . polyethylene glycol powder (MIRALAX) powder Take 17 g by mouth daily. To prevent constipation  . ranitidine (ZANTAC) 150 MG tablet Take 150 mg by mouth 2 (two) times daily.  . simvastatin (ZOCOR) 20 MG tablet Take 20 mg by mouth at bedtime.      Past Medical History:  Diagnosis Date  . Allergic rhinitis   . Common bile duct dilation 05/22/2016  . Diverticulosis   . DM (diabetes mellitus) (HCC)   . Essential hypertension 05/22/2016  . GERD (gastroesophageal reflux disease)   . High cholesterol 05/22/2016  . Hyperlipidemia   . Hypertension   . Restless leg syndrome      No Known Allergies  BP (!) 150/90   Pulse 74   Ht 5' 1.5" (1.562 m)   Wt 186 lb (84.4 kg)   BMI 34.58 kg/m    Physical Exam   Constitutional: She is oriented to person, place, and time. She appears well-developed and well-nourished.  Neurological: She is alert and oriented to person, place, and time.  Psychiatric: She has a normal mood and affect. Judgment normal.  Vitals reviewed.   Right Knee Exam   Muscle Strength  The patient has normal right knee strength.  Tenderness  The patient is experiencing no tenderness.   Range of Motion  Extension: 0  Right knee flexion: 115.   Tests  Drawer:  Anterior - negative    Posterior - negative  Other  Erythema: absent Sensation: normal Pulse: present Swelling: none     Left knee medial joint line is tender.  No effusion.  Flexion arc is 120 degrees knee is stable in all planes strength is normal including extension skin is warm dry and intact without erythema pulse and temperature normal no distal edema normal sensation is noted throughout the left leg  Medical decision-making  Imaging: New films were taken today 1 for follow-up 15 years on the right total knee which shows no loosening or narrowing of the joint in terms of the spacer  She has severe osteoarthritis of the left knee with varus alignment    Encounter Diagnoses  Name Primary?  . Status post total knee replacement, right 2005   . Left knee  pain, unspecified chronicity   . Primary osteoarthritis of left knee Yes   The procedure has been fully reviewed with the patient; The risks and benefits of surgery have been discussed and explained and understood. Alternative treatment has also been reviewed, questions were encouraged and answered. The postoperative plan is also been reviewed.  We will schedule the patient for knee replacement on the left sometime beginning of May.  She will call us back to confirm that this is okay with her daughter   Fuller Canada, MD 04/14/2017 11:54 AM

## 2017-04-20 ENCOUNTER — Telehealth: Payer: Self-pay | Admitting: Orthopedic Surgery

## 2017-04-20 NOTE — Telephone Encounter (Signed)
Gloria Sanchez called stating she had told Dr. Romeo AppleHarrison she would get back with him about a surgery date. She stated she was ok with it being scheduled anytime 5/1 through 5/10.  Please call and advise

## 2017-04-20 NOTE — Telephone Encounter (Signed)
knee replacement on the left  / patient ready to schedule wants may 1 through may 10.

## 2017-04-21 ENCOUNTER — Telehealth: Payer: Self-pay | Admitting: Orthopedic Surgery

## 2017-04-21 NOTE — Telephone Encounter (Signed)
Patient wants surgery scheduled sometime between 5/6 and 5/10. I put wrong date in yesterday's note.   Please advise.

## 2017-04-26 ENCOUNTER — Other Ambulatory Visit: Payer: Self-pay | Admitting: Orthopedic Surgery

## 2017-06-30 ENCOUNTER — Encounter: Payer: Self-pay | Admitting: Obstetrics and Gynecology

## 2017-06-30 ENCOUNTER — Ambulatory Visit: Payer: Medicare Other | Admitting: Obstetrics and Gynecology

## 2017-06-30 VITALS — BP 136/80 | HR 80 | Ht 61.5 in | Wt 187.8 lb

## 2017-06-30 DIAGNOSIS — N8110 Cystocele, unspecified: Secondary | ICD-10-CM

## 2017-06-30 NOTE — Progress Notes (Signed)
Patient ID: Gloria Sanchez, female   DOB: 29-Jun-1940, 77 y.o.   MRN: [id number]   [facility] Clinic Visit  @DATE @            Patient name: Gloria Sanchez MRN [id number]  Date of birth: 04-26-1940  CC & HPI:  Gloria Sanchez is a 77 y.o. female presenting today for pelvic pain which she seems to think is related to her anterior and posterior repair surgery that was done on 12/29/2016. She is not having any issues with her bowel movements. She is not sexually active. The patient has been struggling to lose weight. She will be getting a left knee replacement in a couple weeks. The patient denies fever, chills or any other symptoms or complaints at this time.  She denies stress incontinence but has noticed a bulge at the introitus which is uncomfortable  ROS:  ROS +pelvic pain -fever -chills All systems are negative except as noted in the HPI and PMH.   Pertinent History Reviewed:   Reviewed: Significant for Cystocele and Rectocele repair Medical         Past Medical History:  Diagnosis Date  . Allergic rhinitis   . Common bile duct dilation 05/22/2016  . Diverticulosis   . DM (diabetes mellitus) (HCC)   . Essential hypertension 05/22/2016  . GERD (gastroesophageal reflux disease)   . High cholesterol 05/22/2016  . Hyperlipidemia   . Hypertension   . Restless leg syndrome                               Surgical Hx:    Past Surgical History:  Procedure Laterality Date  . ANTERIOR AND POSTERIOR REPAIR N/A 12/29/2016   Procedure: ANTERIOR (CYSTOCELE) AND POSTERIOR REPAIR (RECTOCELE);  Surgeon: Tilda Burrow, MD;  Location: [facility];  Service: Gynecology;  Laterality: N/A;  . CHOLECYSTECTOMY    . complete hysterectomy    . ERCP W/ SPHICTEROTOMY  1974  . REPLACEMENT TOTAL KNEE     Medications: Reviewed & Updated - see associated section                       Current Outpatient Medications:  .  acetaminophen (TYLENOL) 500 MG tablet, Take 1,000 mg as needed by mouth., Disp: , Rfl:  .   amLODipine (NORVASC) 5 MG tablet, Take 5 mg by mouth daily., Disp: , Rfl:  .  clonazePAM (KLONOPIN) 0.5 MG tablet, Take 0.5 mg at bedtime by mouth. , Disp: , Rfl:  .  gabapentin (NEURONTIN) 300 MG capsule, Take 1 capsule (300 mg total) by mouth at bedtime., Disp: 90 capsule, Rfl: 5 .  hydrochlorothiazide (HYDRODIURIL) 25 MG tablet, Take 25 mg by mouth daily., Disp: , Rfl: 3 .  losartan (COZAAR) 100 MG tablet, Take 100 mg by mouth daily., Disp: , Rfl: 3 .  metFORMIN (GLUCOPHAGE-XR) 500 MG 24 hr tablet, Take 500 mg by mouth at bedtime., Disp: , Rfl: 3 .  polyethylene glycol powder (MIRALAX) powder, Take 17 g by mouth daily. To prevent constipation, Disp: 255 g, Rfl: prn .  ranitidine (ZANTAC) 150 MG tablet, Take 150 mg by mouth 2 (two) times daily., Disp: , Rfl:  .  simvastatin (ZOCOR) 20 MG tablet, Take 20 mg by mouth at bedtime.  , Disp: , Rfl:    Social History: Reviewed -  reports that she has never smoked. She has never used smokeless tobacco.  Objective Findings:  Vitals: Blood pressure 136/80, pulse 80, height 5' 1.5" (1.562 m), weight 187 lb 12.8 oz (85.2 kg).  PHYSICAL EXAMINATION General appearance - alert, well appearing, and in no distress, oriented to person, place, and time and overweight Mental status - alert, oriented to person, place, and time, normal mood, behavior, speech, dress, motor activity, and thought processes, affect appropriate to mood  PELVIC External genitalia -relaxed introitus, posterior support appears satisfactory Vulva -normal Vagina -recurrence of cystocele to the introitus.  The patient coughs there is a massive amount of downward pressure on the bladder.  There is no loss of urine.  There is no loss of urine as well when the area supported with the surgeon's fingers Cervix - surgically absent Uterus - surgically absent Adnexa -no masses [place] -not done Rectal - stable with good anterior support, no recurrence of the rectocele  Transvaginal U/S  used to confirm that the bulge is entirely the bladder  Assessment & Plan:   A:  1. Grade II Cystocele 2. Breakdown of prior anterior repair due to obesity and intra-abdominal pressure  P: Patient strongly encouraged that any weight loss that she can accomplish prior to her orthopedic surgery is needed and will help her recovery 1. F/U 3 months after knee surgery to reassess.  She might be a candidate for the poise pad.  Reduction of intra-abdominal pressure would be a critical part of any successful effort she would probably require perineoplasty as well to reduce introitus size to give her some improved posterior support beneath the anterior wall  By signing my name below, I, Diona BrownerJennifer Gorman, attest that this documentation has been prepared under the direction and in the presence of Tilda BurrowFerguson, Vong Garringer V, MD. Electronically Signed: Diona BrownerJennifer Gorman, Medical Scribe. 06/30/17. 1:48 PM.  I personally performed the services described in this documentation, which was SCRIBED in my presence. The recorded information has been reviewed and considered accurate. It has been edited as necessary during review. Tilda BurrowJohn V Amarria Andreasen, MD

## 2017-07-23 ENCOUNTER — Telehealth: Payer: Self-pay | Admitting: Radiology

## 2017-07-23 DIAGNOSIS — M25562 Pain in left knee: Secondary | ICD-10-CM

## 2017-07-23 DIAGNOSIS — M1712 Unilateral primary osteoarthritis, left knee: Secondary | ICD-10-CM

## 2017-07-23 NOTE — Telephone Encounter (Signed)
I have submitted a prior auth request online for patients knee surgery. The pending auth number is [id number]. Will have to be [facility], [facility], then hospital will have to approve any additional stay.

## 2017-07-26 ENCOUNTER — Telehealth: Payer: Self-pay | Admitting: Orthopedic Surgery

## 2017-07-26 NOTE — Telephone Encounter (Signed)
I have advised Gloria Sanchez, to Dr Mauri Brooklyn

## 2017-07-26 NOTE — Telephone Encounter (Signed)
Gloria Sanchez called this morning stating she needs to cancel her surgery.  She said her husband is very ill and is on life support at this time.  She will call us back when she wants to reschedule the surgery.  She asks that we cancel all appointments in connection with the surgery, pre op etc.  Thanks

## 2017-07-29 ENCOUNTER — Inpatient Hospital Stay (HOSPITAL_COMMUNITY): Admission: RE | Admit: 2017-07-29 | Payer: Medicare Other | Source: Ambulatory Visit

## 2017-08-03 ENCOUNTER — Encounter (HOSPITAL_COMMUNITY): Admission: RE | Payer: Self-pay | Source: Ambulatory Visit

## 2017-08-03 ENCOUNTER — Inpatient Hospital Stay (HOSPITAL_COMMUNITY): Admission: RE | Admit: 2017-08-03 | Payer: Medicare Other | Source: Ambulatory Visit | Admitting: Orthopedic Surgery

## 2017-08-03 SURGERY — ARTHROPLASTY, KNEE, TOTAL
Anesthesia: Choice | Laterality: Left

## 2017-09-29 ENCOUNTER — Ambulatory Visit: Payer: Medicare Other | Admitting: Obstetrics and Gynecology

## 2017-11-09 DIAGNOSIS — M7552 Bursitis of left shoulder: Secondary | ICD-10-CM | POA: Insufficient documentation

## 2018-04-23 ENCOUNTER — Other Ambulatory Visit: Payer: Self-pay | Admitting: Orthopedic Surgery

## 2018-04-23 DIAGNOSIS — G8929 Other chronic pain: Secondary | ICD-10-CM

## 2018-04-23 DIAGNOSIS — M545 Low back pain: Principal | ICD-10-CM

## 2018-04-28 ENCOUNTER — Ambulatory Visit (INDEPENDENT_AMBULATORY_CARE_PROVIDER_SITE_OTHER): Payer: Medicare Other

## 2018-04-28 ENCOUNTER — Telehealth: Payer: Self-pay | Admitting: Radiology

## 2018-04-28 ENCOUNTER — Ambulatory Visit: Payer: Medicare Other | Admitting: Orthopedic Surgery

## 2018-04-28 VITALS — BP 140/91 | HR 101 | Ht 61.0 in | Wt 193.0 lb

## 2018-04-28 DIAGNOSIS — M1712 Unilateral primary osteoarthritis, left knee: Secondary | ICD-10-CM | POA: Diagnosis not present

## 2018-04-28 DIAGNOSIS — Z96651 Presence of right artificial knee joint: Secondary | ICD-10-CM

## 2018-04-28 NOTE — Patient Instructions (Signed)
You have decided to proceed with knee replacement surgery. You have decided not to continue with nonoperative measures such as but not limited to oral medication, weight loss, activity modification, physical therapy, bracing, or injection.  We will perform the procedure commonly known as total knee replacement. Some of the risks associated with knee replacement surgery include but are not limited to Bleeding Infection Swelling Stiffness Blood clot Pulmonary embolism  Loosening of the implant Pain that persists even after surgery  Infection is especially devastating complication of knee surgery although rare. If infection does occur your implant will usually have to be removed and several surgeries and antibiotics will be needed to eradicate the infection prior to performing a repeat replacement.   In some cases amputation is required to eradicate the infection. In other rare cases a knee fusion is needed   In compliance with recent [place] law in federal regulation regarding opioid use and abuse and addiction, we will taper (stop) opioid medication after 2 weeks.  If you're not comfortable with these risks and would like to continue with nonoperative treatment please let Dr. Liem Copenhaver know prior to your surgery.  

## 2018-04-28 NOTE — Telephone Encounter (Signed)
-----   Message from Nobie Putnam sent at 04/28/2018  3:50 PM EST ----- Ok ----- Message ----- From: Caffie Damme, RT Sent: 04/28/2018   3:23 PM EST To: Nobie Putnam  We put in orders for her knee replacement for 05/10/18 Can you change to 23 hour observation, outpatient in bed? The insurance will not cover knee replacement as inpatient, only for 23 hour obs. Thanks.

## 2018-04-28 NOTE — Telephone Encounter (Signed)
To you FYI Dr Romeo Apple, her knee replacement has to be OP 23 hour observation  It is posted as such, but like for you to know

## 2018-04-28 NOTE — Progress Notes (Signed)
PREOP    Chief Complaint  Patient presents with  . Follow-up    Recheck on left knee    This is a 78 year old female who did well with a right total knee replacement, was scheduled for a left total knee replacement but backed out of the surgery at that time because of her husband who has now passed.  She comes in today with her sister complaining of almost a year or more of pain in her right knee primarily on the medial side.  She reports dull throbbing medial knee pain usually moderate occasionally severe not getting any better even with rest including use of a walker.  She comes in today wanting to schedule the surgery  She denies any weight loss blurred vision headache chest pain shortness of breath heartburn frequency skin changes numbness tingling depression bleeding easily excessive thirst or urination or food allergy   ROS See listing above  Past Medical History:  Diagnosis Date  . Allergic rhinitis   . Common bile duct dilation 05/22/2016  . Diverticulosis   . DM (diabetes mellitus) (HCC)   . Essential hypertension 05/22/2016  . GERD (gastroesophageal reflux disease)   . High cholesterol 05/22/2016  . Hyperlipidemia   . Hypertension   . Restless leg syndrome     Past Surgical History:  Procedure Laterality Date  . ANTERIOR AND POSTERIOR REPAIR N/A 12/29/2016   Procedure: ANTERIOR (CYSTOCELE) AND POSTERIOR REPAIR (RECTOCELE);  Surgeon: Tilda BurrowFerguson, John V, MD;  Location: [facility];  Service: Gynecology;  Laterality: N/A;  . CHOLECYSTECTOMY    . complete hysterectomy    . ERCP W/ SPHICTEROTOMY  1974  . REPLACEMENT TOTAL KNEE      Family History  Problem Relation Age of Onset  . Diabetes Sister   . Emphysema Father   . Dementia Mother   . Hypertension Mother   . COPD Brother   . Heart attack Brother   . Breast cancer Sister   . Diabetes Son    Social History   Tobacco Use  . Smoking status: Never Smoker  . Smokeless tobacco: Never Used  Substance Use Topics  .  Alcohol use: No  . Drug use: No    No Known Allergies   No outpatient medications have been marked as taking for the 04/28/18 encounter (Office Visit) with Vickki HearingHarrison, Stanley E, MD.    BP (!) 140/91   Pulse (!) 101   Ht 5\' 1"  (1.549 m)   Wt 193 lb (87.5 kg)   BMI 36.47 kg/m   Physical Exam Vitals signs reviewed.  Constitutional:      Appearance: Normal appearance. She is well-developed.  HENT:     Head: Normocephalic and atraumatic.  Skin:    General: Skin is warm and dry.     Capillary Refill: Capillary refill takes less than 2 seconds.  Neurological:     Mental Status: She is alert and oriented to person, place, and time.     Cranial Nerves: No cranial nerve deficit.     Sensory: No sensory deficit.     Motor: No weakness.     Coordination: Coordination normal.     Gait: Gait abnormal.     Deep Tendon Reflexes: Reflexes normal.  Psychiatric:        Attention and Perception: Attention normal.        Mood and Affect: Mood and affect normal.        Speech: Speech normal.        Behavior: Behavior normal.  Thought Content: Thought content normal.        Judgment: Judgment normal.     Ortho Exam Left knee:  Knee flexion 125 degrees inspection palpation tenderness medial compartment all ligaments stable muscle tone and strength normal normal quadricep straight leg raise with no extensor lag skin clean pulse and temperature normal without edema in the left leg lymph nodes are negative in the left groin normal sensation is noted in the left lower extremity  The right knee is functioning well prior total knee is clean functional range of motion knee is stable strength is normal skin is intact pulse and perfusion are normal  Upper extremity grip strength is normal MEDICAL DECISION SECTION  xrays ordered?  Yes  My independent reading of xrays: See dictated report, she has a mild to moderate varus with severe medial compartment and secondary bone changes consistent  with arthritis   Encounter Diagnoses  Name Primary?  . Status post total knee replacement, right 2005   . Primary osteoarthritis of left knee Yes     PLAN:   Surgical procedure planned: Left total knee arthroplasty  The procedure has been fully reviewed with the patient; The risks and benefits of surgery have been discussed and explained and understood. Alternative treatment has also been reviewed, questions were encouraged and answered. The postoperative plan is also been reviewed.  Nonsurgical treatment as described in the history and physical section was attempted and unsuccessful and the patient has agreed to proceed with surgical intervention to improve their situation.  No orders of the defined types were placed in this encounter.   Fuller CanadaStanley Harrison, MD 04/28/2018 2:07 PM

## 2018-05-03 NOTE — Patient Instructions (Signed)
Gloria Sanchez  05/03/2018     @PREFPERIOPPHARMACY @   Your procedure is scheduled on  05/10/2018.  Report to [facility] at  830  A.M.  Call this number if you have problems the morning of surgery:  [phone]   Remember:  Do not eat or drink after midnight.                        Take these medicines the morning of surgery with A SIP OF WATER  Amlodipine, losartan, mobic ( if needed), protonix.    Do not wear jewelry, make-up or nail polish.  Do not wear lotions, powders, or perfumes, or deodorant.  Do not shave 48 hours prior to surgery.  Men may shave face and neck.  Do not bring valuables to the hospital.  [facility] is not responsible for any belongings or valuables.  Contacts, dentures or bridgework may not be worn into surgery.  Leave your suitcase in the car.  After surgery it may be brought to your room.  For patients admitted to the hospital, discharge time will be determined by your treatment team.  Patients discharged the day of surgery will not be allowed to drive home.   Name and phone number of your driver:   family Special instructions:  None  Please read over the following fact sheets that you were given. Pain Booklet, Coughing and Deep Breathing, Blood Transfusion Information, Total Joint Packet, MRSA Information, Surgical Site Infection Prevention, Anesthesia Post-op Instructions and Care and Recovery After Surgery       Total Knee Replacement Total knee replacement is a procedure to replace the knee joint with an artificial (prosthetic) knee joint. The purpose of this surgery is to reduce knee pain and improve knee function. The prosthetic knee joint (prosthesis) may be made of metal, plastic or ceramic. It replaces parts of the thigh bone (femur), lower leg bone (tibia), and kneecap (patella) that are removed during the procedure. Tell a health care provider about:  Any allergies you have.  All medicines you are taking,  including vitamins, herbs, eye drops, creams, and over-the-counter medicines.  Any problems you or family members have had with anesthetic medicines.  Any blood disorders you have.  Any surgeries you have had.  Any medical conditions you have.  Whether you are pregnant or may be pregnant. What are the risks? Generally, this is a safe procedure. However, problems may occur, including:  Infection.  Bleeding.  Allergic reactions to medicines.  Damage to other structures or organs.  Decreased range of motion of the knee.  Instability of the knee.  Loosening of the prosthetic joint.  Knee pain that does not go away (chronic pain). What happens before the procedure? Staying hydrated Follow instructions from your health care provider about hydration, which may include:  Up to 2 hours before the procedure - you may continue to drink clear liquids, such as water, clear fruit juice, black coffee, and plain tea.  Eating and drinking restrictions Follow instructions from your health care provider about eating and drinking, which may include:  8 hours before the procedure - stop eating heavy meals or foods such as meat, fried foods, or fatty foods.  6 hours before the procedure - stop eating light meals or foods, such as toast or cereal.  6 hours before the procedure - stop drinking milk or drinks that contain milk.  2 hours before the procedure - stop drinking clear liquids. Medicines  Ask your health care provider about: ? Changing or stopping your regular medicines. This is especially important if you are taking diabetes medicines or blood thinners. ? Taking medicines such as aspirin and ibuprofen. These medicines can thin your blood. Do not take these medicines before your procedure if your health care provider instructs you not to.  You may be given antibiotic medicine to help prevent infection. General instructions  Have dental care and routine cleanings completed  before your procedure. Plan to not have dental work done for 3 months after your procedure. Germs from anywhere in your body, including your mouth, can travel to your new joint and infect it.  Ask your health care provider how your surgical site will be marked or identified.  If your health care provider prescribes physical therapy, do exercises as instructed.  Do not use any tobacco products, such as cigarettes, chewing tobacco, or e-cigarettes. If you need help quitting, ask your health care provider.  You may have a physical exam.  You may have tests, such as: ? X-rays. ? MRI. ? CT scan. ? Bone scans.  You may have a blood or urine sample taken.  Plan to have someone take you home after the procedure.  If you will be going home right after the procedure, plan to have someone with you for at least 24 hours. It is recommended that you have someone to help care for you for at least 4-6 weeks after your procedure. What happens during the procedure?   To reduce your risk of infection: ? Your health care team will wash or sanitize their hands. ? Your skin will be washed with soap.  An IV tube will be inserted into one of your veins.  You will be given one or more of the following: ? A medicine to help you relax (sedative). ? A medicine to numb the area (local anesthetic). ? A medicine to make you fall asleep (general anesthetic). ? A medicine that is injected into your spine to numb the area below and slightly above the injection site (spinal anesthetic). ? A medicine that is injected into an area of your body to numb everything below the injection site (regional anesthetic).  An incision will be made in your knee.  Damaged cartilage and bone will be removed from your femur, tibia, and patella.  Parts of the prosthesis (liners) will be placed over the areas of bone and cartilage that were removed. A metal liner will be placed over your femur, and plastic liners will be placed  over your tibia and the underside of your patella.  One or more small tubes (drains) may be placed near your incision to help drain extra fluid from your surgical site.  Your incision will be closed with stitches (sutures), skin glue, or adhesive strips. Medicine may be applied to your incision.  A bandage (dressing) will be placed over your incision. The procedure may vary among health care providers and hospitals. What happens after the procedure?  Your blood pressure, heart rate, breathing rate, and blood oxygen level will be monitored often until the medicines you were given have worn off.  You may continue to receive fluids and medicines through an IV tube.  You will have some pain. Pain medicines will be available to help you.  You may have fluid coming from one or more drains in your incision.  You may have to wear compression stockings. These stockings  help to prevent blood clots and reduce swelling in your legs.  You will be encouraged to move around as much as possible.  You may be given a continuous passive motion machine to use at home. You will be shown how to use this machine.  Do not drive for 24 hours if you received a sedative. This information is not intended to replace advice given to you by your health care provider. Make sure you discuss any questions you have with your health care provider. Document Released: 06/22/2000 Document Revised: 09/03/2016 Document Reviewed: 02/20/2015 Elsevier Interactive Patient Education  2019 [employer]  Total Knee Replacement, Care After Refer to this sheet in the next few weeks. These instructions provide you with information about caring for yourself after your procedure. Your health care provider may also give you more specific instructions. Your treatment has been planned according to current medical practices, but problems sometimes occur. Call your health care provider if you have any problems or questions after your  procedure. What can I expect after the procedure? After the procedure, it is common to have:  Pain and swelling.  A small amount of blood or clear fluid coming from your incision.  Limited range of motion. Follow these instructions at home: Medicines  Take over-the-counter and prescription medicines only as told by your health care provider.  If you were prescribed an antibiotic medicine, take it as told by your health care provider. Do not stop taking the antibiotic even if you start to feel better.  If you were prescribed a blood thinner (anticoagulant), take it as told by your health care provider. Bathing  Do not take baths, swim, or use a hot tub until your health care provider approves. Ask your health care provider if you can take showers. You may only be allowed to take sponge baths for bathing.  If you have an immobilizer that is not waterproof, cover it with a watertight covering when you take a bath or shower.  Keep your bandage (dressing) dry until your health care provider says it can be removed. Incision care and drain care   Check your incision area and drain site every day for signs of infection. Check for: ? More redness, swelling, or pain. ? More fluid or blood. ? Warmth. ? Pus or a bad smell.  Follow instructions from your health care provider about how to take care of your incision. Make sure you: ? Wash your hands with soap and water before you change your dressing. If soap and water are not available, use alcohol-based hand sanitizer. ? Change your dressing as told by your health care provider. ? Leave stitches (sutures), skin glue, or adhesive strips in place. These skin closures may need to stay in place for 2 weeks or longer. If adhesive strip edges start to loosen and curl up, you may trim the loose edges. Do not remove adhesive strips completely unless your health care provider tells you to do that.  If you have a drain, follow instructions from your  health care provider about caring for it. Do not remove the drain tube or any dressings around the tube opening unless your health care provider approves. Managing pain, stiffness, and swelling      If directed, put ice on your knee. ? Put ice in a plastic bag or use the icing device (cold flow pad or cryocuff) that you were given. Follow instructions from your health care provider about how to use the icing device. ? Place a  towel between your skin and the bag or between your skin and the icing device. ? Leave the ice on for 20 minutes, 2-3 times per day.  If directed, apply heat to the affected area as often as told by your health care provider. Use the heat source that your health care provider recommends, such as a moist heat pack or a heating pad. ? Place a towel between your skin and the heat source. ? Leave the heat on for 20-30 minutes. ? Remove the heat if your skin turns bright red. This is especially important if you are unable to feel pain, heat, or cold. You may have a greater risk of getting burned.  Move your toes often to avoid stiffness and to lessen swelling.  Raise (elevate) your knee above the level of your heart while you are sitting or lying down.  Wear elastic knee support for as long as told by your health care provider. Driving   Do not drive until your health care provider approves. Ask your health care provider when it is safe to drive if you have an immobilizer on your knee.  Do not drive or operate heavy machinery while taking prescription pain medicine.  Do not drive for 24 hours if you received a sedative. Activity  Do not play contact sports until your health care provider approves.  Avoid high-impact activities, including running, jumping rope, and jumping jacks.  Avoid sitting for a long time without moving. Get up and move around at least every few hours.  If physical therapy was prescribed, do exercises as told by your health care  provider.  Return to your normal activities as told by your health care provider. Ask your health care provider what activities are safe for you. Safety  Do not use your leg to support your body weight until your health care provider approves. Use crutches or a walker as told by your health care provider. General instructions  Do not have any dental work done for at least 3 months after your surgery. When you do have dental work done, tell your dentist about your joint replacement.  Do not use any tobacco products, such as cigarettes, chewing tobacco, or e-cigarettes. If you need help quitting, ask your health care provider.  Wear compression stockings as told by your health care provider. These stockings help to prevent blood clots and reduce swelling in your legs.  If you have been sent home with a continuous passive motion machine, use it as told by your health care provider.  Drink enough fluid to keep your urine clear or pale yellow.  If you have been instructed to lose weight, follow instructions from your health care provider about how to do this safely.  Keep all follow-up visits as told by your health care provider. This is important. Contact a health care provider if:  You have more redness, swelling, or pain around your incision or drain.  You have more fluid or blood coming from your incision or drain.  Your incision or drain site feels warm to the touch.  You have pus or a bad smell coming from your incision or drain.  You have a fever.  Your incision breaks open after your health care provider removes your sutures, skin glue, or adhesive tape.  Your prosthesis feels loose.  You have knee pain that does not go away. Get help right away if:  You have a rash.  You have pain or swelling in your calf or thigh.  You  have shortness of breath or difficulty breathing.  You have chest pain.  Your range of motion in your knee is getting worse. This information is  not intended to replace advice given to you by your health care provider. Make sure you discuss any questions you have with your health care provider. Document Released: 10/03/2004 Document Revised: 08/09/2016 Document Reviewed: 02/20/2015 Elsevier Interactive Patient Education  2019 [employer]  General Anesthesia, Adult General anesthesia is the use of medicines to make a person "go to sleep" (unconscious) for a medical procedure. General anesthesia must be used for certain procedures, and is often recommended for procedures that:  Last a long time.  Require you to be still or in an unusual position.  Are major and can cause blood loss. The medicines used for general anesthesia are called general anesthetics. As well as making you unconscious for a certain amount of time, these medicines:  Prevent pain.  Control your blood pressure.  Relax your muscles. Tell a health care provider about:  Any allergies you have.  All medicines you are taking, including vitamins, herbs, eye drops, creams, and over-the-counter medicines.  Any problems you or family members have had with anesthetic medicines.  Types of anesthetics you have had in the past.  Any blood disorders you have.  Any surgeries you have had.  Any medical conditions you have.  Any recent upper respiratory, chest, or ear infections.  Any history of: ? Heart or lung conditions, such as heart failure, sleep apnea, asthma, or chronic obstructive pulmonary disease (COPD). ? Financial plannerMilitary service. ? Depression or anxiety.  Any tobacco or drug use, including marijuana or alcohol use.  Whether you are pregnant or may be pregnant. What are the risks? Generally, this is a safe procedure. However, problems may occur, including:  Allergic reaction.  Lung and heart problems.  Inhaling food or liquid from the stomach into the lungs (aspiration).  Nerve injury.  Dental injury.  Air in the bloodstream, which can lead to  stroke.  Extreme agitation or confusion (delirium) when you wake up from the anesthetic.  Waking up during your procedure and being unable to move. This is rare. These problems are more likely to develop if you are having a major surgery or if you have an advanced or serious medical condition. You can prevent some of these complications by answering all of your health care provider's questions thoroughly and by following all instructions before your procedure. General anesthesia can cause side effects, including:  Nausea or vomiting.  A sore throat from the breathing tube.  Hoarseness.  Wheezing or coughing.  Shaking chills.  Tiredness.  Body aches.  Anxiety.  Sleepiness or drowsiness.  Confusion or agitation. What happens before the procedure? Staying hydrated Follow instructions from your health care provider about hydration, which may include:  Up to 2 hours before the procedure - you may continue to drink clear liquids, such as water, clear fruit juice, black coffee, and plain tea.  Eating and drinking restrictions Follow instructions from your health care provider about eating and drinking, which may include:  8 hours before the procedure - stop eating heavy meals or foods such as meat, fried foods, or fatty foods.  6 hours before the procedure - stop eating light meals or foods, such as toast or cereal.  6 hours before the procedure - stop drinking milk or drinks that contain milk.  2 hours before the procedure - stop drinking clear liquids. Medicines Ask your health care provider about:  Changing or stopping your regular medicines. This is especially important if you are taking diabetes medicines or blood thinners.  Taking medicines such as aspirin and ibuprofen. These medicines can thin your blood. Do not take these medicines unless your health care provider tells you to take them.  Taking over-the-counter medicines, vitamins, herbs, and supplements. Do not  take these during the week before your procedure unless your health care provider approves them. General instructions  Starting 3-6 weeks before the procedure, do not use any products that contain nicotine or tobacco, such as cigarettes and e-cigarettes. If you need help quitting, ask your health care provider.  If you brush your teeth on the morning of the procedure, make sure to spit out all of the toothpaste.  Tell your health care provider if you become ill or develop a cold, cough, or fever.  If instructed by your health care provider, bring your sleep apnea device with you on the day of your surgery (if applicable).  Ask your health care provider if you will be going home the same day, the following day, or after a longer hospital stay. ? Plan to have someone take you home from the hospital or clinic. ? Plan to have a responsible adult care for you for at least 24 hours after you leave the hospital or clinic. This is important. What happens during the procedure?   You will be given anesthetics through both of the following: ? A mask placed over your nose and mouth. ? An IV in one of your veins.  You may receive a medicine to help you relax (sedative).  After you are unconscious, a breathing tube may be inserted down your throat to help you breathe. This will be removed before you wake up.  An anesthesia specialist will stay with you throughout your procedure. He or she will: ? Keep you comfortable and safe by continuing to give you medicines and adjusting the amount of medicine that you get. ? Monitor your blood pressure, pulse, and oxygen levels to make sure that the anesthetics do not cause any problems. The procedure may vary among health care providers and hospitals. What happens after the procedure?  Your blood pressure, temperature, heart rate, breathing rate, and blood oxygen level will be monitored until the medicines you were given have worn off.  You will wake up in a  recovery area. You may wake up slowly.  If you feel anxious or agitated, you may be given medicine to help you calm down.  If you will be going home the same day, your health care provider may check to make sure you can walk, drink, and urinate.  Your health care provider will treat any pain or side effects you have before you go home.  Do not drive for 24 hours if you were given a sedative. Summary  General anesthesia is used to keep you still and prevent pain during a procedure.  It is important to tell your health care provider about your medical history and any surgeries you have had, and previous experience with anesthesia.  Follow your health care provider's instructions about when to stop eating, drinking, or taking certain medicines before your procedure.  Plan to have someone take you home from the hospital or clinic. This information is not intended to replace advice given to you by your health care provider. Make sure you discuss any questions you have with your health care provider. Document Released: 06/23/2007 Document Revised: 08/03/2017 Document Reviewed: 10/30/2016 Elsevier Interactive  Patient Education  2019 [employer]  General Anesthesia, Adult, Care After This sheet gives you information about how to care for yourself after your procedure. Your health care provider may also give you more specific instructions. If you have problems or questions, contact your health care provider. What can I expect after the procedure? After the procedure, the following side effects are common:  Pain or discomfort at the IV site.  Nausea.  Vomiting.  Sore throat.  Trouble concentrating.  Feeling cold or chills.  Weak or tired.  Sleepiness and fatigue.  Soreness and body aches. These side effects can affect parts of the body that were not involved in surgery. Follow these instructions at home:  For at least 24 hours after the procedure:  Have a responsible adult  stay with you. It is important to have someone help care for you until you are awake and alert.  Rest as needed.  Do not: ? Participate in activities in which you could fall or become injured. ? Drive. ? Use heavy machinery. ? Drink alcohol. ? Take sleeping pills or medicines that cause drowsiness. ? Make important decisions or sign legal documents. ? Take care of children on your own. Eating and drinking  Follow any instructions from your health care provider about eating or drinking restrictions.  When you feel hungry, start by eating small amounts of foods that are soft and easy to digest (bland), such as toast. Gradually return to your regular diet.  Drink enough fluid to keep your urine pale yellow.  If you vomit, rehydrate by drinking water, juice, or clear broth. General instructions  If you have sleep apnea, surgery and certain medicines can increase your risk for breathing problems. Follow instructions from your health care provider about wearing your sleep device: ? Anytime you are sleeping, including during daytime naps. ? While taking prescription pain medicines, sleeping medicines, or medicines that make you drowsy.  Return to your normal activities as told by your health care provider. Ask your health care provider what activities are safe for you.  Take over-the-counter and prescription medicines only as told by your health care provider.  If you smoke, do not smoke without supervision.  Keep all follow-up visits as told by your health care provider. This is important. Contact a health care provider if:  You have nausea or vomiting that does not get better with medicine.  You cannot eat or drink without vomiting.  You have pain that does not get better with medicine.  You are unable to pass urine.  You develop a skin rash.  You have a fever.  You have redness around your IV site that gets worse. Get help right away if:  You have difficulty  breathing.  You have chest pain.  You have blood in your urine or stool, or you vomit blood. Summary  After the procedure, it is common to have a sore throat or nausea. It is also common to feel tired.  Have a responsible adult stay with you for the first 24 hours after general anesthesia. It is important to have someone help care for you until you are awake and alert.  When you feel hungry, start by eating small amounts of foods that are soft and easy to digest (bland), such as toast. Gradually return to your regular diet.  Drink enough fluid to keep your urine pale yellow.  Return to your normal activities as told by your health care provider. Ask your health care provider what activities  are safe for you. This information is not intended to replace advice given to you by your health care provider. Make sure you discuss any questions you have with your health care provider. Document Released: 06/22/2000 Document Revised: 10/30/2016 Document Reviewed: 10/30/2016 Elsevier Interactive Patient Education  2019 [employer].  Spinal Anesthesia and Epidural Anesthesia Spinal anesthesia and epidural anesthesia are methods of numbing the body. They are done by injecting numbing medicine (anesthetic) into the back, near the spinal cord. Spinal anesthesia is usually done to numb an area at and below the place where the injection is made. It is often used during surgeries of the pelvis, hips, legs, and lower abdomen. It begins to work almost immediately. Epidural anesthesia may be done to numb an area above or below the area where the injection is made. It is often used during childbirth and after major abdominal or chest surgery. It begins to work after 10-20 minutes. Tell a health care provider about:  Any allergies you have.  All medicines you are taking, including vitamins, herbs, eye drops, creams, and over-the-counter medicines.  Any problems you or family members have had with anesthetic  medicines.  Any blood disorders you have.  Any surgeries you have had.  Any medical conditions you have.  Whether you are pregnant or may be pregnant.  Any recent alcohol, tobacco, or drug use. What are the risks? Generally, this is a safe procedure. However, problems may occur, including:  Headache.  A drop in blood pressure. In some cases, this can lead to a heart attack or a stroke.  Nerve damage.  Infection.  Allergic reaction to medicines.  Seizures.  Spinal fluid leak.  Bleeding around the injection site.  Inability to breathe. If this happens, a tube may be put into your windpipe (trachea) and a machine may be used to help you breathe until the anesthetic wears off.  Long-lasting numbness, pain, or loss of function of body parts.  Nausea and vomiting.  Dizziness and fainting.  Shivering.  Itching. What happens before the procedure? Staying hydrated Follow instructions from your health care provider about hydration, which may include:  Up to 2 hours before the procedure - you may continue to drink clear liquids, such as water, clear fruit juice, black coffee, and plain tea.  Eating and drinking restrictions Follow instructions from your health care provider about eating and drinking, which may include:  8 hours before the procedure - stop eating heavy meals or foods such as meat, fried foods, or fatty foods.  6 hours before the procedure - stop eating light meals or foods, such as toast or cereal.  6 hours before the procedure - stop drinking milk or drinks that contain milk.  2 hours before the procedure - stop drinking clear liquids. Medicine Ask your health care provider about:  Changing or stopping your regular medicines. This is especially important if you are taking diabetes medicines or blood thinners.  Changing or stopping your dietary supplements.  Taking medicines such as aspirin and ibuprofen. These medicines can thin your blood. Do not  take these medicines before your procedure if your health care provider instructs you not to. General instructions   Do not use any tobacco products, such as cigarettes, chewing tobacco, and electronic cigarettes, for as long as possible before your procedure. If you need help quitting, ask your health care provider.  If you use a sleep apnea device, ask your health care provider whether you should bring it with you on the  day of your surgery.  Ask your health care provider if you will have to stay overnight at the hospital or clinic.  If you will not have to stay overnight: ? Plan to have someone take you home from the hospital or clinic. ? Plan to have a responsible adult care for you for at least 24 hours after you leave the hospital or clinic. This is important. What happens during the procedure?   A health care provider will put patches on your chest, a cuff around your arm, or a sensor device on your finger. These will be attached to monitors that allow your health care provider to watch your blood pressure, pulse, and oxygen levels to make sure that the anesthetic does not cause any problems.  An IV tube may be inserted into one of your veins. The tube will be used to give you fluids and medicines throughout the procedure as needed.  You may be given a medicine to help you relax (sedative).  You will be asked to sit up or to lie on your side with your knees and your chin bent toward your chest. These positions open up the space between the bones in your back, making it easier to inject the medicine.  The area of your back where the medicine will be injected will be cleaned.  A medicine called a local anesthetic may be injected to numb the area where the spinal or epidural anesthetic will be injected.  A needle will be inserted between the bones of your back. While this is being done: ? Continue to breathe normally. ? Stay as still and quiet as you can. ? If you feel a tingling  shock or pain going down your leg, tell your health care provider but try not to move.  The spinal or epidural anesthetic will be injected.  If you receive an epidural anesthetic and need more than one dose, a tiny, flexible tube (catheter) will be placed where the anesthetic was injected. Additional doses will be given through the catheter. If you need pain medicine after the procedure, the catheter will be kept in place.  If an epidural catheter has not been placed in your injection site or left there, a small bandage (dressing) will be placed over the injection site. The procedure may vary among health care providers and hospitals. What happens after the procedure?  You will need to stay in bed until your health care provider says it safe for you to walk.  Your blood pressure, heart rate, breathing rate, and blood oxygen level will be monitored until the medicines you were given have worn off.  If you have a catheter, it will be removed when it is no longer needed.  Do not drive for 24 hours if you received a sedative.  It is common to have nausea and itching. There are medicines that can help with these side effects. It is also common to have: ? Sleepiness. ? Vomiting. ? Numbness or tingling in your legs. ? Trouble urinating. Summary  Spinal anesthesia and epidural anesthesia are methods of numbing the body.  Tell your health care provider about any allergies or medical problems you have, any problems you have had with anesthetic medicines, any blood disorders you have, and whether you are or may be pregnant.  If you use a sleep apnea device, ask your health care provider whether you should bring it with you on the day of your surgery.  The spinal or epidural anesthetic will be injected  through the space between the bones in your back.  Do not drive for 24 hours if you received a sedative. This information is not intended to replace advice given to you by your health care  provider. Make sure you discuss any questions you have with your health care provider. Document Released: 06/06/2003 Document Revised: 10/28/2016 Document Reviewed: 07/08/2015 Elsevier Interactive Patient Education  2019 [employer] Spinal Anesthesia and Epidural Anesthesia, Care After This sheet gives you information about how to care for yourself after your procedure. Your doctor may also give you more specific instructions. If you have problems or questions, call your doctor. Follow these instructions at home: For at least 24 hours after the procedure:   Have a responsible adult stay with you. It is important to have someone help care for you until you are awake and alert.  Rest as needed.  Do not do activities where you could fall or get hurt (injured).  Do not drive.  Do not use heavy machinery.  Do not drink alcohol.  Do not take sleeping pills or medicines that make you sleepy (drowsy).  Do not make important decisions.  Do not sign legal documents.  Do not take care of children on your own. Eating and drinking  If you throw up (vomit), drink water, juice, or soup when nausea and vomiting stop.  Drink enough fluid to keep your pee (urine) pale yellow.  Make sure you do not feel like throwing up (nauseous) before you eat solid foods.  Follow the diet that your doctor recommends. General instructions  Return to your normal activities as told by your doctor. Ask your doctor what activities are safe for you.  Take over-the-counter and prescription medicines only as told by your doctor.  If you have sleep apnea, surgery and certain medicines can raise your risk for breathing problems. Follow instructions from your doctor about when to wear your sleep device. Your doctor may tell you to wear your sleep device: ? Anytime you are sleeping, including during daytime naps. ? While taking prescription pain medicines, sleeping pills, or medicines that make you sleepy.  Do  not use any products that contain nicotine or tobacco. This includes cigarettes and e-cigarettes. ? If you need help quitting, ask your doctor. ? If you smoke, do not smoke by yourself. Make sure someone is nearby in case you need help.  Keep all follow-up visits as told by your doctor. This is important. Contact a doctor if:  It has been more than one day since your procedure and you feel like throwing up.  It has been more than one day since your procedure and you throw up.  You have a rash. Get help right away if:  You have a fever.  You have a headache that lasts a long time.  You have a very bad headache.  Your vision is blurry.  You see two of a single object (double vision).  You are dizzy or light-headed.  You faint.  Your arms or legs tingle, feel weak, or get numb.  You have trouble breathing.  You cannot pee (urinate). Summary  After the procedure, have a responsible adult stay with you at home until you are fully awake and alert.  Do not do activities that might get you injured. Do not drive, use heavy machinery, drink alcohol, or make important decisions for 24 hours after the procedure.  Take medicines as told by your doctor. Do not use products that contain nicotine or tobacco.  Get help right away if you have a fever, blurry vision, difficulty breathing or passing urine, or weakness or numbness in arms or legs. This information is not intended to replace advice given to you by your health care provider. Make sure you discuss any questions you have with your health care provider. Document Released: 07/08/2015 Document Revised: 10/28/2016 Document Reviewed: 07/08/2015 Elsevier Interactive Patient Education  2019 [employer].

## 2018-05-06 ENCOUNTER — Other Ambulatory Visit: Payer: Self-pay

## 2018-05-06 ENCOUNTER — Encounter (HOSPITAL_COMMUNITY)
Admission: RE | Admit: 2018-05-06 | Discharge: 2018-05-06 | Disposition: A | Discharge status: Home / Self Care | Payer: Medicare Other | Source: Ambulatory Visit | Attending: Orthopedic Surgery | Admitting: Orthopedic Surgery

## 2018-05-06 ENCOUNTER — Encounter (HOSPITAL_COMMUNITY): Payer: Self-pay

## 2018-05-06 DIAGNOSIS — Z01812 Encounter for preprocedural laboratory examination: Secondary | ICD-10-CM | POA: Diagnosis not present

## 2018-05-06 HISTORY — DX: Unspecified osteoarthritis, unspecified site: M19.90

## 2018-05-06 LAB — CBC WITH DIFFERENTIAL/PLATELET
Abs Immature Granulocytes: 0.02 10*3/uL (ref 0.00–0.07)
Basophils Absolute: 0 10*3/uL (ref 0.0–0.1)
Basophils Relative: 0 %
EOS ABS: 0.1 10*3/uL (ref 0.0–0.5)
Eosinophils Relative: 1 %
HCT: 38.9 % (ref 36.0–46.0)
Hemoglobin: 12.6 g/dL (ref 12.0–15.0)
IMMATURE GRANULOCYTES: 0 %
Lymphocytes Relative: 36 %
Lymphs Abs: 3.3 10*3/uL (ref 0.7–4.0)
MCH: 31.5 pg (ref 26.0–34.0)
MCHC: 32.4 g/dL (ref 30.0–36.0)
MCV: 97.3 fL (ref 80.0–100.0)
Monocytes Absolute: 0.6 10*3/uL (ref 0.1–1.0)
Monocytes Relative: 6 %
NRBC: 0 % (ref 0.0–0.2)
Neutro Abs: 5 10*3/uL (ref 1.7–7.7)
Neutrophils Relative %: 57 %
Platelets: 289 10*3/uL (ref 150–400)
RBC: 4 MIL/uL (ref 3.87–5.11)
RDW: 13.2 % (ref 11.5–15.5)
WBC: 9 10*3/uL (ref 4.0–10.5)

## 2018-05-06 LAB — BASIC METABOLIC PANEL
ANION GAP: 9 (ref 5–15)
BUN: 20 mg/dL (ref 8–23)
CO2: 27 mmol/L (ref 22–32)
Calcium: 9.4 mg/dL (ref 8.9–10.3)
Chloride: 101 mmol/L (ref 98–111)
Creatinine, Ser: 0.76 mg/dL (ref 0.44–1.00)
GFR calc non Af Amer: >60 mL/min (ref >60)
Glucose, Bld: 92 mg/dL (ref 70–99)
Potassium: 3.3 mmol/L — ABNORMAL LOW (ref 3.5–5.1)
Sodium: 137 mmol/L (ref 135–145)

## 2018-05-06 LAB — PREPARE RBC (CROSSMATCH)

## 2018-05-06 LAB — SURGICAL PCR SCREEN
MRSA, PCR: NEGATIVE
Staphylococcus aureus: NEGATIVE

## 2018-05-06 LAB — ABO/RH: ABO/RH(D): A POS

## 2018-05-09 MED ORDER — TRANEXAMIC ACID-NACL 1000-0.7 MG/100ML-% IV SOLN
1000.0000 mg | INTRAVENOUS | Status: AC
  Administered 2018-05-10: 1000 mg via INTRAVENOUS

## 2018-05-09 NOTE — H&P (Signed)
PREOP          Chief Complaint  Patient presents with  . Follow-up      Recheck on left knee      This is a 78 year old female who did well with a right total knee replacement, was scheduled for a left total knee replacement but backed out of the surgery at that time because of her husband who has now passed.  She comes in today with her sister complaining of almost a year or more of pain in her right knee primarily on the medial side.  She reports dull throbbing medial knee pain usually moderate occasionally severe not getting any better even with rest including use of a walker.  She comes in today wanting to schedule the surgery   She denies any weight loss blurred vision headache chest pain shortness of breath heartburn frequency skin changes numbness tingling depression bleeding easily excessive thirst or urination or food allergy     ROS See listing above       Past Medical History:  Diagnosis Date  . Allergic rhinitis    . Common bile duct dilation 05/22/2016  . Diverticulosis    . DM (diabetes mellitus) (HCC)    . Essential hypertension 05/22/2016  . GERD (gastroesophageal reflux disease)    . High cholesterol 05/22/2016  . Hyperlipidemia    . Hypertension    . Restless leg syndrome             Past Surgical History:  Procedure Laterality Date  . ANTERIOR AND POSTERIOR REPAIR N/A 12/29/2016    Procedure: ANTERIOR (CYSTOCELE) AND POSTERIOR REPAIR (RECTOCELE);  Surgeon: Tilda Burrow, MD;  Location: [facility];  Service: Gynecology;  Laterality: N/A;  . CHOLECYSTECTOMY      . complete hysterectomy      . ERCP W/ SPHICTEROTOMY   1974  . REPLACEMENT TOTAL KNEE               Family History  Problem Relation Age of Onset  . Diabetes Sister    . Emphysema Father    . Dementia Mother    . Hypertension Mother    . COPD Brother    . Heart attack Brother    . Breast cancer Sister    . Diabetes Son      Social History        Tobacco Use  . Smoking status: Never  Smoker  . Smokeless tobacco: Never Used  Substance Use Topics  . Alcohol use: No  . Drug use: No      No Known Allergies     Active Medications  No outpatient medications have been marked as taking for the 04/28/18 encounter (Office Visit) with Vickki Hearing, MD.        BP (!) 140/91   Pulse (!) 101   Ht 5\' 1"  (1.549 m)   Wt 193 lb (87.5 kg)   BMI 36.47 kg/m    Physical Exam Vitals signs reviewed.  Constitutional:      Appearance: Normal appearance. She is well-developed.  HENT:     Head: Normocephalic and atraumatic.  Skin:    General: Skin is warm and dry.     Capillary Refill: Capillary refill takes less than 2 seconds.  Neurological:     Mental Status: She is alert and oriented to person, place, and time.     Cranial Nerves: No cranial nerve deficit.     Sensory: No sensory deficit.     Motor:  No weakness.     Coordination: Coordination normal.     Gait: Gait abnormal.     Deep Tendon Reflexes: Reflexes normal.  Psychiatric:        Attention and Perception: Attention normal.        Mood and Affect: Mood and affect normal.        Speech: Speech normal.        Behavior: Behavior normal.        Thought Content: Thought content normal.        Judgment: Judgment normal.        Ortho Exam Left knee:   Knee flexion 125 degrees inspection palpation tenderness medial compartment all ligaments stable muscle tone and strength normal normal quadricep straight leg raise with no extensor lag skin clean pulse and temperature normal without edema in the left leg lymph nodes are negative in the left groin normal sensation is noted in the left lower extremity   The right knee is functioning well prior total knee is clean functional range of motion knee is stable strength is normal skin is intact pulse and perfusion are normal   Upper extremity grip strength is normal MEDICAL DECISION SECTION  xrays ordered?  Yes   My independent reading of xrays: See dictated  report, she has a mild to moderate varus with severe medial compartment and secondary bone changes consistent with arthritis         Encounter Diagnoses  Name Primary?  . Status post total knee replacement, right 2005    . Primary osteoarthritis of left knee Yes        PLAN:    Surgical procedure planned: Left total knee arthroplasty   The procedure has been fully reviewed with the patient; The risks and benefits of surgery have been discussed and explained and understood. Alternative treatment has also been reviewed, questions were encouraged and answered. The postoperative plan is also been reviewed.   Nonsurgical treatment as described in the history and physical section was attempted and unsuccessful and the patient has agreed to proceed with surgical intervention to improve their situation.   No orders of the defined types were placed in this encounter.     Fuller Canada, MD 04/28/2018

## 2018-05-10 ENCOUNTER — Observation Stay (HOSPITAL_COMMUNITY): Payer: Medicare Other | Admitting: Anesthesiology

## 2018-05-10 ENCOUNTER — Encounter (HOSPITAL_COMMUNITY)
Admission: RE | Disposition: A | Discharge status: Skilled Nursing Facility | Payer: Self-pay | Source: Home / Self Care | Attending: Orthopedic Surgery

## 2018-05-10 ENCOUNTER — Observation Stay (HOSPITAL_COMMUNITY): Payer: Medicare Other

## 2018-05-10 ENCOUNTER — Encounter (HOSPITAL_COMMUNITY): Payer: Self-pay | Admitting: *Deleted

## 2018-05-10 ENCOUNTER — Inpatient Hospital Stay (HOSPITAL_COMMUNITY)
Admission: RE | Admit: 2018-05-10 | Discharge: 2018-05-13 | DRG: 470 | Disposition: A | Discharge status: Skilled Nursing Facility | Payer: Medicare Other | Attending: Orthopedic Surgery | Admitting: Orthopedic Surgery

## 2018-05-10 ENCOUNTER — Telehealth: Payer: Self-pay | Admitting: Orthopedic Surgery

## 2018-05-10 DIAGNOSIS — M1712 Unilateral primary osteoarthritis, left knee: Secondary | ICD-10-CM | POA: Diagnosis present

## 2018-05-10 DIAGNOSIS — E876 Hypokalemia: Secondary | ICD-10-CM | POA: Diagnosis not present

## 2018-05-10 DIAGNOSIS — Z7982 Long term (current) use of aspirin: Secondary | ICD-10-CM

## 2018-05-10 DIAGNOSIS — Z9049 Acquired absence of other specified parts of digestive tract: Secondary | ICD-10-CM

## 2018-05-10 DIAGNOSIS — G8929 Other chronic pain: Secondary | ICD-10-CM

## 2018-05-10 DIAGNOSIS — E119 Type 2 diabetes mellitus without complications: Secondary | ICD-10-CM | POA: Diagnosis present

## 2018-05-10 DIAGNOSIS — Z7984 Long term (current) use of oral hypoglycemic drugs: Secondary | ICD-10-CM

## 2018-05-10 DIAGNOSIS — E785 Hyperlipidemia, unspecified: Secondary | ICD-10-CM | POA: Diagnosis present

## 2018-05-10 DIAGNOSIS — Z96651 Presence of right artificial knee joint: Secondary | ICD-10-CM | POA: Diagnosis present

## 2018-05-10 DIAGNOSIS — Z8249 Family history of ischemic heart disease and other diseases of the circulatory system: Secondary | ICD-10-CM

## 2018-05-10 DIAGNOSIS — J9811 Atelectasis: Secondary | ICD-10-CM | POA: Diagnosis not present

## 2018-05-10 DIAGNOSIS — G2581 Restless legs syndrome: Secondary | ICD-10-CM | POA: Diagnosis present

## 2018-05-10 DIAGNOSIS — Z833 Family history of diabetes mellitus: Secondary | ICD-10-CM | POA: Diagnosis not present

## 2018-05-10 DIAGNOSIS — J309 Allergic rhinitis, unspecified: Secondary | ICD-10-CM | POA: Diagnosis present

## 2018-05-10 DIAGNOSIS — M545 Low back pain: Secondary | ICD-10-CM

## 2018-05-10 DIAGNOSIS — I10 Essential (primary) hypertension: Secondary | ICD-10-CM | POA: Diagnosis present

## 2018-05-10 DIAGNOSIS — K219 Gastro-esophageal reflux disease without esophagitis: Secondary | ICD-10-CM | POA: Diagnosis present

## 2018-05-10 DIAGNOSIS — M1710 Unilateral primary osteoarthritis, unspecified knee: Secondary | ICD-10-CM | POA: Diagnosis present

## 2018-05-10 DIAGNOSIS — Z96652 Presence of left artificial knee joint: Secondary | ICD-10-CM

## 2018-05-10 HISTORY — PX: TOTAL KNEE ARTHROPLASTY: SHX125

## 2018-05-10 LAB — GLUCOSE, CAPILLARY
GLUCOSE-CAPILLARY: 187 mg/dL — AB (ref 70–99)
Glucose-Capillary: 101 mg/dL — ABNORMAL HIGH (ref 70–99)
Glucose-Capillary: 145 mg/dL — ABNORMAL HIGH (ref 70–99)

## 2018-05-10 SURGERY — ARTHROPLASTY, KNEE, TOTAL
Anesthesia: General | Site: Knee | Laterality: Left

## 2018-05-10 MED ORDER — METFORMIN HCL ER 500 MG PO TB24
500.0000 mg | ORAL_TABLET | Freq: Every day | ORAL | Status: DC
  Administered 2018-05-10 – 2018-05-12 (×3): 500 mg via ORAL
  Filled 2018-05-10 (×3): qty 1

## 2018-05-10 MED ORDER — PROPOFOL 10 MG/ML IV BOLUS
INTRAVENOUS | Status: DC | PRN
  Administered 2018-05-10: 30 mg via INTRAVENOUS
  Administered 2018-05-10: 130 mg via INTRAVENOUS

## 2018-05-10 MED ORDER — HYDROMORPHONE HCL 1 MG/ML IJ SOLN
0.2500 mg | INTRAMUSCULAR | Status: DC | PRN
  Administered 2018-05-10 (×2): 0.5 mg via INTRAVENOUS
  Filled 2018-05-10: qty 0.5

## 2018-05-10 MED ORDER — METOCLOPRAMIDE HCL 10 MG PO TABS: 5.0000 mg | ORAL_TABLET | Freq: Three times a day (TID) | ORAL | Status: DC | PRN

## 2018-05-10 MED ORDER — GABAPENTIN 100 MG PO CAPS: 100.0000 mg | ORAL_CAPSULE | Freq: Three times a day (TID) | ORAL | Status: DC

## 2018-05-10 MED ORDER — HYDRALAZINE HCL 20 MG/ML IJ SOLN
INTRAMUSCULAR | Status: DC | PRN
  Administered 2018-05-10: 6 mg via INTRAVENOUS
  Administered 2018-05-10: 4 mg via INTRAVENOUS

## 2018-05-10 MED ORDER — SODIUM CHLORIDE (PF) 0.9 % IJ SOLN
INTRAMUSCULAR | Status: AC
  Filled 2018-05-10: qty 40

## 2018-05-10 MED ORDER — BUPIVACAINE LIPOSOME 1.3 % IJ SUSP
INTRAMUSCULAR | Status: AC
  Filled 2018-05-10: qty 20

## 2018-05-10 MED ORDER — ONDANSETRON HCL 4 MG/2ML IJ SOLN
4.0000 mg | Freq: Once | INTRAMUSCULAR | Status: AC
  Administered 2018-05-10: 4 mg via INTRAVENOUS

## 2018-05-10 MED ORDER — MIDAZOLAM HCL 5 MG/5ML IJ SOLN
INTRAMUSCULAR | Status: DC | PRN
  Administered 2018-05-10 (×2): 1 mg via INTRAVENOUS

## 2018-05-10 MED ORDER — ONDANSETRON HCL 4 MG/2ML IJ SOLN
INTRAMUSCULAR | Status: AC
  Filled 2018-05-10: qty 2

## 2018-05-10 MED ORDER — SENNOSIDES-DOCUSATE SODIUM 8.6-50 MG PO TABS: 1.0000 | ORAL_TABLET | Freq: Every evening | ORAL | Status: DC | PRN

## 2018-05-10 MED ORDER — EPHEDRINE SULFATE 50 MG/ML IJ SOLN
INTRAMUSCULAR | Status: DC | PRN
  Administered 2018-05-10 (×2): 10 mg via INTRAVENOUS

## 2018-05-10 MED ORDER — CELECOXIB 400 MG PO CAPS
400.0000 mg | ORAL_CAPSULE | Freq: Once | ORAL | Status: AC
  Administered 2018-05-10: 400 mg via ORAL
  Filled 2018-05-10: qty 1

## 2018-05-10 MED ORDER — TRAMADOL HCL 50 MG PO TABS
50.0000 mg | ORAL_TABLET | Freq: Four times a day (QID) | ORAL | Status: DC
  Administered 2018-05-10 – 2018-05-13 (×12): 50 mg via ORAL
  Filled 2018-05-10 (×12): qty 1

## 2018-05-10 MED ORDER — 0.9 % SODIUM CHLORIDE (POUR BTL) OPTIME
TOPICAL | Status: DC | PRN
  Administered 2018-05-10: 1000 mL

## 2018-05-10 MED ORDER — PANTOPRAZOLE SODIUM 40 MG PO TBEC
40.0000 mg | DELAYED_RELEASE_TABLET | Freq: Every day | ORAL | Status: DC
  Administered 2018-05-11 – 2018-05-13 (×3): 40 mg via ORAL
  Filled 2018-05-10 (×3): qty 1

## 2018-05-10 MED ORDER — MEPERIDINE HCL 50 MG/ML IJ SOLN: 6.2500 mg | INTRAMUSCULAR | Status: DC | PRN

## 2018-05-10 MED ORDER — SUGAMMADEX SODIUM 200 MG/2ML IV SOLN
INTRAVENOUS | Status: DC | PRN
  Administered 2018-05-10: 100 mg via INTRAVENOUS

## 2018-05-10 MED ORDER — FENTANYL CITRATE (PF) 100 MCG/2ML IJ SOLN
INTRAMUSCULAR | Status: AC
  Filled 2018-05-10: qty 2

## 2018-05-10 MED ORDER — GABAPENTIN 300 MG PO CAPS
300.0000 mg | ORAL_CAPSULE | Freq: Three times a day (TID) | ORAL | Status: DC
  Administered 2018-05-10 – 2018-05-13 (×9): 300 mg via ORAL
  Filled 2018-05-10 (×9): qty 1

## 2018-05-10 MED ORDER — ONDANSETRON HCL 4 MG PO TABS
4.0000 mg | ORAL_TABLET | Freq: Four times a day (QID) | ORAL | Status: DC | PRN
  Administered 2018-05-11 – 2018-05-12 (×2): 4 mg via ORAL
  Filled 2018-05-10 (×2): qty 1

## 2018-05-10 MED ORDER — ONDANSETRON HCL 4 MG/2ML IJ SOLN
INTRAMUSCULAR | Status: DC | PRN
  Administered 2018-05-10: 4 mg via INTRAVENOUS

## 2018-05-10 MED ORDER — CHLORHEXIDINE GLUCONATE 4 % EX LIQD: 60.0000 mL | Freq: Once | CUTANEOUS | Status: DC

## 2018-05-10 MED ORDER — LOSARTAN POTASSIUM 50 MG PO TABS
100.0000 mg | ORAL_TABLET | Freq: Every day | ORAL | Status: DC
  Administered 2018-05-11 – 2018-05-13 (×3): 100 mg via ORAL
  Filled 2018-05-10 (×3): qty 2

## 2018-05-10 MED ORDER — PROMETHAZINE HCL 25 MG/ML IJ SOLN: 6.2500 mg | INTRAMUSCULAR | Status: DC | PRN

## 2018-05-10 MED ORDER — SODIUM CHLORIDE 0.9 % IV SOLN
INTRAVENOUS | Status: DC | PRN
  Administered 2018-05-10: 60 mL

## 2018-05-10 MED ORDER — PANTOPRAZOLE SODIUM 40 MG PO TBEC: 40.0000 mg | DELAYED_RELEASE_TABLET | Freq: Every day | ORAL | Status: DC

## 2018-05-10 MED ORDER — OXYCODONE HCL 5 MG PO TABS
5.0000 mg | ORAL_TABLET | Freq: Once | ORAL | Status: AC
  Administered 2018-05-10: 5 mg via ORAL
  Filled 2018-05-10: qty 1

## 2018-05-10 MED ORDER — SUCCINYLCHOLINE CHLORIDE 20 MG/ML IJ SOLN
INTRAMUSCULAR | Status: DC | PRN
  Administered 2018-05-10: 100 mg via INTRAVENOUS

## 2018-05-10 MED ORDER — PHENOL 1.4 % MT LIQD: 1.0000 | OROMUCOSAL | Status: DC | PRN

## 2018-05-10 MED ORDER — SUCCINYLCHOLINE CHLORIDE 200 MG/10ML IV SOSY
PREFILLED_SYRINGE | INTRAVENOUS | Status: AC
  Filled 2018-05-10: qty 10

## 2018-05-10 MED ORDER — BUPIVACAINE-EPINEPHRINE (PF) 0.5% -1:200000 IJ SOLN
INTRAMUSCULAR | Status: DC | PRN
  Administered 2018-05-10: 20 mL via PERINEURAL

## 2018-05-10 MED ORDER — CLONAZEPAM 0.5 MG PO TABS: 0.2500 mg | ORAL_TABLET | Freq: Every evening | ORAL | Status: DC | PRN

## 2018-05-10 MED ORDER — AMLODIPINE BESYLATE 5 MG PO TABS
5.0000 mg | ORAL_TABLET | Freq: Every day | ORAL | Status: DC
  Administered 2018-05-11 – 2018-05-13 (×3): 5 mg via ORAL
  Filled 2018-05-10 (×3): qty 1

## 2018-05-10 MED ORDER — DOCUSATE SODIUM 100 MG PO CAPS
100.0000 mg | ORAL_CAPSULE | Freq: Two times a day (BID) | ORAL | Status: DC
  Administered 2018-05-10 – 2018-05-13 (×6): 100 mg via ORAL
  Filled 2018-05-10 (×6): qty 1

## 2018-05-10 MED ORDER — METHOCARBAMOL 500 MG PO TABS
500.0000 mg | ORAL_TABLET | Freq: Four times a day (QID) | ORAL | Status: DC | PRN
  Administered 2018-05-12: 500 mg via ORAL
  Filled 2018-05-10: qty 1

## 2018-05-10 MED ORDER — ACETAMINOPHEN 500 MG PO TABS
500.0000 mg | ORAL_TABLET | Freq: Four times a day (QID) | ORAL | Status: AC
  Administered 2018-05-10 – 2018-05-11 (×4): 500 mg via ORAL
  Filled 2018-05-10 (×4): qty 1

## 2018-05-10 MED ORDER — LACTATED RINGERS IV SOLN: INTRAVENOUS | Status: DC

## 2018-05-10 MED ORDER — CEFAZOLIN SODIUM-DEXTROSE 2-4 GM/100ML-% IV SOLN
2.0000 g | INTRAVENOUS | Status: AC
  Administered 2018-05-10: 2 g via INTRAVENOUS
  Filled 2018-05-10: qty 100

## 2018-05-10 MED ORDER — MORPHINE SULFATE (PF) 2 MG/ML IV SOLN: 0.5000 mg | INTRAVENOUS | Status: DC | PRN

## 2018-05-10 MED ORDER — FENTANYL CITRATE (PF) 100 MCG/2ML IJ SOLN
INTRAMUSCULAR | Status: DC | PRN
  Administered 2018-05-10 (×3): 50 ug via INTRAVENOUS
  Administered 2018-05-10: 100 ug via INTRAVENOUS
  Administered 2018-05-10: 50 ug via INTRAVENOUS

## 2018-05-10 MED ORDER — HYDROMORPHONE HCL 1 MG/ML IJ SOLN
INTRAMUSCULAR | Status: AC
  Filled 2018-05-10: qty 0.5

## 2018-05-10 MED ORDER — SIMVASTATIN 20 MG PO TABS
20.0000 mg | ORAL_TABLET | Freq: Every day | ORAL | Status: DC
  Administered 2018-05-10 – 2018-05-12 (×3): 20 mg via ORAL
  Filled 2018-05-10 (×3): qty 1

## 2018-05-10 MED ORDER — TRANEXAMIC ACID-NACL 1000-0.7 MG/100ML-% IV SOLN
INTRAVENOUS | Status: AC
  Filled 2018-05-10: qty 100

## 2018-05-10 MED ORDER — ROCURONIUM BROMIDE 100 MG/10ML IV SOLN
INTRAVENOUS | Status: DC | PRN
  Administered 2018-05-10: 40 mg via INTRAVENOUS

## 2018-05-10 MED ORDER — ROCURONIUM BROMIDE 10 MG/ML (PF) SYRINGE
PREFILLED_SYRINGE | INTRAVENOUS | Status: AC
  Filled 2018-05-10: qty 10

## 2018-05-10 MED ORDER — SEVOFLURANE IN SOLN
RESPIRATORY_TRACT | Status: AC
  Filled 2018-05-10: qty 250

## 2018-05-10 MED ORDER — EPHEDRINE 5 MG/ML INJ
INTRAVENOUS | Status: AC
  Filled 2018-05-10: qty 10

## 2018-05-10 MED ORDER — HYDROCODONE-ACETAMINOPHEN 5-325 MG PO TABS
1.0000 | ORAL_TABLET | ORAL | Status: DC | PRN
  Administered 2018-05-10: 2 via ORAL
  Filled 2018-05-10: qty 2

## 2018-05-10 MED ORDER — METHOCARBAMOL 1000 MG/10ML IJ SOLN: 500.0000 mg | Freq: Four times a day (QID) | INTRAVENOUS | Status: DC | PRN

## 2018-05-10 MED ORDER — MENTHOL 3 MG MT LOZG: 1.0000 | LOZENGE | OROMUCOSAL | Status: DC | PRN

## 2018-05-10 MED ORDER — HYDROCHLOROTHIAZIDE 25 MG PO TABS
25.0000 mg | ORAL_TABLET | Freq: Every day | ORAL | Status: DC
  Administered 2018-05-11 – 2018-05-13 (×3): 25 mg via ORAL
  Filled 2018-05-10 (×3): qty 1

## 2018-05-10 MED ORDER — SUGAMMADEX SODIUM 200 MG/2ML IV SOLN
INTRAVENOUS | Status: AC
  Filled 2018-05-10: qty 2

## 2018-05-10 MED ORDER — BUPIVACAINE-EPINEPHRINE (PF) 0.25% -1:200000 IJ SOLN
INTRAMUSCULAR | Status: AC
  Filled 2018-05-10: qty 60

## 2018-05-10 MED ORDER — LACTATED RINGERS IV SOLN
INTRAVENOUS | Status: DC
  Administered 2018-05-10: 1000 mL via INTRAVENOUS
  Administered 2018-05-10: 13:00:00 via INTRAVENOUS

## 2018-05-10 MED ORDER — SODIUM CHLORIDE 0.9 % IR SOLN
Status: DC | PRN
  Administered 2018-05-10: 3000 mL

## 2018-05-10 MED ORDER — ASPIRIN EC 325 MG PO TBEC
325.0000 mg | DELAYED_RELEASE_TABLET | Freq: Every day | ORAL | Status: DC
  Administered 2018-05-11 – 2018-05-13 (×3): 325 mg via ORAL
  Filled 2018-05-10 (×3): qty 1

## 2018-05-10 MED ORDER — CALCIUM CARBONATE ANTACID 500 MG PO CHEW: 1.0000 | CHEWABLE_TABLET | Freq: Three times a day (TID) | ORAL | Status: DC | PRN

## 2018-05-10 MED ORDER — HYDRALAZINE HCL 20 MG/ML IJ SOLN
INTRAMUSCULAR | Status: AC
  Filled 2018-05-10: qty 1

## 2018-05-10 MED ORDER — MIDAZOLAM HCL 2 MG/2ML IJ SOLN
INTRAMUSCULAR | Status: AC
  Filled 2018-05-10: qty 2

## 2018-05-10 MED ORDER — ONDANSETRON HCL 4 MG/2ML IJ SOLN
4.0000 mg | Freq: Four times a day (QID) | INTRAMUSCULAR | Status: DC | PRN
  Filled 2018-05-10: qty 2

## 2018-05-10 MED ORDER — CEFAZOLIN SODIUM-DEXTROSE 2-4 GM/100ML-% IV SOLN
2.0000 g | Freq: Four times a day (QID) | INTRAVENOUS | Status: AC
  Administered 2018-05-10 (×2): 2 g via INTRAVENOUS
  Filled 2018-05-10 (×2): qty 100

## 2018-05-10 MED ORDER — FENTANYL CITRATE (PF) 100 MCG/2ML IJ SOLN
INTRAMUSCULAR | Status: AC
  Filled 2018-05-10: qty 4

## 2018-05-10 MED ORDER — TRANEXAMIC ACID-NACL 1000-0.7 MG/100ML-% IV SOLN
1000.0000 mg | Freq: Once | INTRAVENOUS | Status: AC
  Administered 2018-05-10: 1000 mg via INTRAVENOUS
  Filled 2018-05-10: qty 100

## 2018-05-10 MED ORDER — SODIUM CHLORIDE 0.9 % IV SOLN
INTRAVENOUS | Status: DC
  Administered 2018-05-10 – 2018-05-11 (×2): via INTRAVENOUS

## 2018-05-10 MED ORDER — METHOCARBAMOL 1000 MG/10ML IJ SOLN
500.0000 mg | Freq: Once | INTRAVENOUS | Status: AC
  Administered 2018-05-10: 500 mg via INTRAVENOUS
  Filled 2018-05-10: qty 5

## 2018-05-10 MED ORDER — ALUM & MAG HYDROXIDE-SIMETH 200-200-20 MG/5ML PO SUSP: 30.0000 mL | ORAL | Status: DC | PRN

## 2018-05-10 MED ORDER — GABAPENTIN 300 MG PO CAPS
300.0000 mg | ORAL_CAPSULE | Freq: Once | ORAL | Status: AC
  Administered 2018-05-10: 300 mg via ORAL
  Filled 2018-05-10: qty 1

## 2018-05-10 MED ORDER — HYDROCODONE-ACETAMINOPHEN 7.5-325 MG PO TABS: 1.0000 | ORAL_TABLET | Freq: Once | ORAL | Status: DC | PRN

## 2018-05-10 MED ORDER — METOCLOPRAMIDE HCL 5 MG/ML IJ SOLN: 5.0000 mg | Freq: Three times a day (TID) | INTRAMUSCULAR | Status: DC | PRN

## 2018-05-10 SURGICAL SUPPLY — 70 items
BANDAGE ELASTIC 4 LF NS (GAUZE/BANDAGES/DRESSINGS) ×4 IMPLANT
BANDAGE ELASTIC 6 LF NS (GAUZE/BANDAGES/DRESSINGS) ×2 IMPLANT
BANDAGE ESMARK 6X9 LF (GAUZE/BANDAGES/DRESSINGS) ×1 IMPLANT
BIT DRILL 3.2X128 (BIT) ×1 IMPLANT
BLADE HEX COATED 2.75 (ELECTRODE) ×2 IMPLANT
BLADE SAGITTAL 25.0X1.27X90 (BLADE) ×2 IMPLANT
BLADE SAW SAG 90X13X1.27 (BLADE) IMPLANT
BNDG CMPR 9X6 STRL LF SNTH (GAUZE/BANDAGES/DRESSINGS) ×1
BNDG CMPR MED 5X4 ELC HKLP NS (GAUZE/BANDAGES/DRESSINGS) ×2
BNDG CMPR MED 5X6 ELC HKLP NS (GAUZE/BANDAGES/DRESSINGS) ×1
BNDG ESMARK 6X9 LF (GAUZE/BANDAGES/DRESSINGS) ×2
CEMENT HV SMART SET (Cement) ×4 IMPLANT
CLOTH BEACON ORANGE TIMEOUT ST (SAFETY) ×2 IMPLANT
COOLER CRYO CUFF IC AND MOTOR (MISCELLANEOUS) ×2 IMPLANT
COVER LIGHT HANDLE STERIS (MISCELLANEOUS) ×4 IMPLANT
COVER WAND RF STERILE (DRAPES) ×1 IMPLANT
CUFF CRYO KNEE18X23 MED (MISCELLANEOUS) ×1 IMPLANT
CUFF TOURNIQUET SINGLE 34IN LL (TOURNIQUET CUFF) ×1 IMPLANT
DECANTER SPIKE VIAL GLASS SM (MISCELLANEOUS) ×4 IMPLANT
DRAPE BACK TABLE (DRAPES) ×2 IMPLANT
DRAPE EXTREMITY T 121X128X90 (DISPOSABLE) ×2 IMPLANT
DRSG MEPILEX BORDER 4X12 (GAUZE/BANDAGES/DRESSINGS) ×2 IMPLANT
DURAPREP 26ML APPLICATOR (WOUND CARE) ×4 IMPLANT
ELECT REM PT RETURN 9FT ADLT (ELECTROSURGICAL) ×2
ELECTRODE REM PT RTRN 9FT ADLT (ELECTROSURGICAL) ×1 IMPLANT
FEMUR LT SIZE 4N (Knees) ×1 IMPLANT
GLOVE BIO SURGEON STRL SZ7 (GLOVE) ×5 IMPLANT
GLOVE BIOGEL PI IND STRL 7.0 (GLOVE) ×2 IMPLANT
GLOVE BIOGEL PI INDICATOR 7.0 (GLOVE) ×5
GLOVE SKINSENSE NS SZ8.0 LF (GLOVE) ×2
GLOVE SKINSENSE STRL SZ8.0 LF (GLOVE) ×2 IMPLANT
GLOVE SS N UNI LF 8.5 STRL (GLOVE) ×2 IMPLANT
GOWN STRL REUS W/ TWL LRG LVL3 (GOWN DISPOSABLE) ×1 IMPLANT
GOWN STRL REUS W/TWL LRG LVL3 (GOWN DISPOSABLE) ×6 IMPLANT
GOWN STRL REUS W/TWL XL LVL3 (GOWN DISPOSABLE) ×2 IMPLANT
HANDPIECE INTERPULSE COAX TIP (DISPOSABLE) ×2
HOOD W/PEELAWAY (MISCELLANEOUS) ×8 IMPLANT
INSERT STABILIZED 10MM (Knees) ×1 IMPLANT
INST SET MAJOR BONE (KITS) ×2 IMPLANT
IV NS IRRIG 3000ML ARTHROMATIC (IV SOLUTION) ×2 IMPLANT
KIT BLADEGUARD II DBL (SET/KITS/TRAYS/PACK) ×2 IMPLANT
KIT TURNOVER CYSTO (KITS) ×2 IMPLANT
MANIFOLD NEPTUNE II (INSTRUMENTS) ×2 IMPLANT
MARKER SKIN DUAL TIP RULER LAB (MISCELLANEOUS) ×2 IMPLANT
NDL HYPO 21X1.5 SAFETY (NEEDLE) ×1 IMPLANT
NEEDLE HYPO 21X1.5 SAFETY (NEEDLE) ×2 IMPLANT
NS IRRIG 1000ML POUR BTL (IV SOLUTION) ×2 IMPLANT
PACK TOTAL JOINT (CUSTOM PROCEDURE TRAY) ×2 IMPLANT
PAD ARMBOARD 7.5X6 YLW CONV (MISCELLANEOUS) ×2 IMPLANT
PAD DANNIFLEX CPM (ORTHOPEDIC SUPPLIES) ×2 IMPLANT
PATELLA DOME PFC 38MM (Knees) ×1 IMPLANT
PILLOW KNEE EXTENSION 0 DEG (MISCELLANEOUS) ×2 IMPLANT
PIN THREADED HEADED SIGMA (PIN) ×1 IMPLANT
PIN/DRILL PACK ORTHO 1/8X3.0 (PIN) ×1 IMPLANT
SAW OSC TIP CART 19.5X105X1.3 (SAW) ×2 IMPLANT
SET BASIN LINEN APH (SET/KITS/TRAYS/PACK) ×2 IMPLANT
SET HNDPC FAN SPRY TIP SCT (DISPOSABLE) ×1 IMPLANT
STAPLER VISISTAT 35W (STAPLE) ×3 IMPLANT
SUT BRALON NAB BRD #1 30IN (SUTURE) ×4 IMPLANT
SUT MNCRL 0 VIOLET CTX 36 (SUTURE) ×1 IMPLANT
SUT MON AB 0 CT1 (SUTURE) ×1 IMPLANT
SUT MON AB 2-0 CT1 36 (SUTURE) IMPLANT
SUT MONOCRYL 0 CTX 36 (SUTURE) ×2
SYRINGE 20CC LL (MISCELLANEOUS) ×10 IMPLANT
TOWEL OR 17X26 4PK STRL BLUE (TOWEL DISPOSABLE) ×2 IMPLANT
TOWER CARTRIDGE SMART MIX (DISPOSABLE) ×2 IMPLANT
TRAY FOLEY MTR SLVR 16FR STAT (SET/KITS/TRAYS/PACK) ×2 IMPLANT
TRAY TIBIAL SZ3 COBALT 71X47MM (Knees) ×1 IMPLANT
WATER STERILE IRR 1000ML POUR (IV SOLUTION) ×4 IMPLANT
YANKAUER SUCT 12FT TUBE ARGYLE (SUCTIONS) ×2 IMPLANT

## 2018-05-10 NOTE — Plan of Care (Signed)
  Problem: Acute Rehab PT Goals(only PT should resolve) Goal: Pt Will Go Supine/Side To Sit Outcome: Progressing Flowsheets (Taken 05/10/2018 1603) Pt will go Supine/Side to Sit: with min guard assist Goal: Patient Will Transfer Sit To/From Stand Outcome: Progressing Flowsheets (Taken 05/10/2018 1603) Patient will transfer sit to/from stand: with supervision Goal: Pt Will Transfer Bed To Chair/Chair To Bed Outcome: Progressing Flowsheets (Taken 05/10/2018 1603) Pt will Transfer Bed to Chair/Chair to Bed: with supervision Goal: Pt Will Ambulate Outcome: Progressing Flowsheets (Taken 05/10/2018 1603) Pt will Ambulate: 100 feet; with rolling walker; with min guard assist   Problem: Acute Rehab PT Goals(only PT should resolve) Goal: Pt Will Go Supine/Side To Sit Outcome: Progressing Flowsheets (Taken 05/10/2018 1603) Pt will go Supine/Side to Sit: with min guard assist Goal: Patient Will Transfer Sit To/From Stand Outcome: Progressing Flowsheets (Taken 05/10/2018 1603) Patient will transfer sit to/from stand: with supervision Goal: Pt Will Transfer Bed To Chair/Chair To Bed Outcome: Progressing Flowsheets (Taken 05/10/2018 1603) Pt will Transfer Bed to Chair/Chair to Bed: with supervision Goal: Pt Will Ambulate Outcome: Progressing Flowsheets (Taken 05/10/2018 1603) Pt will Ambulate: 100 feet; with rolling walker; with min guard assist   4:04 PM, 05/10/18 Ocie Bob, MPT Physical Therapist with [facility] [phone] office [phone] mobile phone

## 2018-05-10 NOTE — Interval H&P Note (Signed)
History and Physical Interval Note:  05/10/2018 10:43 AM  Gloria Sanchez  has presented today for surgery, with the diagnosis of left knee osteoarthritis  The various methods of treatment have been discussed with the patient and family. After consideration of risks, benefits and other options for treatment, the patient has consented to  Procedure(s): TOTAL KNEE ARTHROPLASTY (Left) as a surgical intervention .  The patient's history has been reviewed, patient examined, no change in status, stable for surgery.  I have reviewed the patient's chart and labs.  Questions were answered to the patient's satisfaction.     Fuller Canada

## 2018-05-10 NOTE — Brief Op Note (Signed)
05/10/2018  1:04 PM  PATIENT:  Franklyn Lor  78 y.o. female  PRE-OPERATIVE DIAGNOSIS:  left knee osteoarthritis  POST-OPERATIVE DIAGNOSIS:  left knee osteoarthritis  PROCEDURE:  Procedure(s): TOTAL KNEE ARTHROPLASTY (Left)   DEPUY SIGMA FIXED BEARING PS 27F 3T 10 POLY 38 P   SURGEON:  Surgeon(s) and Role:    Vickki Hearing, MD - Primary  PHYSICIAN ASSISTANT:   ASSISTANTS: BETTY ASHLEY   ANESTHESIA:   general  EBL:  50 mL   BLOOD ADMINISTERED:none  DRAINS: none   LOCAL MEDICATIONS USED:  EXPAREL AND MARCAINE WITH EPI   SPECIMEN:  No Specimen  DISPOSITION OF SPECIMEN:  N/A  COUNTS:  YES  TOURNIQUET:   Total Tourniquet Time Documented: Thigh (Left) - 90 minutes Total: Thigh (Left) - 90 minutes   DICTATION: .Reubin Milan Dictation  PLAN OF CARE: Admit to inpatient   PATIENT DISPOSITION:  PACU - hemodynamically stable.   Delay start of Pharmacological VTE agent (>24hrs) due to surgical blood loss or risk of bleeding: no

## 2018-05-10 NOTE — Transfer of Care (Signed)
Immediate Anesthesia Transfer of Care Note  Patient: Gloria Sanchez  Procedure(s) Performed: TOTAL KNEE ARTHROPLASTY (Left Knee)  Patient Location: PACU  Anesthesia Type:General  Level of Consciousness: drowsy  Airway & Oxygen Therapy: Patient Spontanous Breathing and Patient connected to face mask oxygen  Post-op Assessment: Report given to RN, Post -op Vital signs reviewed and stable and Patient moving all extremities  Post vital signs: Reviewed and stable  Last Vitals:  Vitals Value Taken Time  BP    Temp    Pulse    Resp    SpO2      Last Pain:  Vitals:   05/10/18 0908  TempSrc: Oral  PainSc: 2       Patients Stated Pain Goal: 7 (05/10/18 0908)  Complications: No apparent anesthesia complications

## 2018-05-10 NOTE — Care Management (Signed)
CM has verified with [facility], they have CPM order and aware pt will be DC tomorrow after 23 hr obs

## 2018-05-10 NOTE — Telephone Encounter (Signed)
Call received via voice message from [place] at [facility] [phone]; relays that per [employer], pre-authorization is required; please advise.

## 2018-05-10 NOTE — Anesthesia Preprocedure Evaluation (Signed)
Anesthesia Evaluation    Airway Mallampati: II       Dental  (+) Edentulous Upper   Pulmonary    breath sounds clear to auscultation + decreased breath sounds      Cardiovascular hypertension, On Medications  Rhythm:regular     Neuro/Psych    GI/Hepatic GERD  ,  Endo/Other  diabetes, Type 2  Renal/GU      Musculoskeletal   Abdominal   Peds  Hematology   Anesthesia Other Findings SR at 53 with RBBB After d/w pt- requests GA.  Had SAB/sedation with Right knee.  Woke up with sedation- uncomfortable with sounds.  Wants to avoid SAB and reports worsening problems with arthritis and Pain doc unable to tx with lumbar injections secondary to OS.  Reproductive/Obstetrics                             Anesthesia Physical Anesthesia Plan  ASA: III  Anesthesia Plan: General   Post-op Pain Management:    Induction:   PONV Risk Score and Plan:   Airway Management Planned:   Additional Equipment:   Intra-op Plan:   Post-operative Plan:   Informed Consent: I have reviewed the patients History and Physical, chart, labs and discussed the procedure including the risks, benefits and alternatives for the proposed anesthesia with the patient or authorized representative who has indicated his/her understanding and acceptance.       Plan Discussed with: Anesthesiologist  Anesthesia Plan Comments:         Anesthesia Quick Evaluation

## 2018-05-10 NOTE — Evaluation (Signed)
Physical Therapy Evaluation Patient Details Name: Gloria Sanchez MRN: [id number] DOB: June 10, 1940 Today's Date: 05/10/2018   LEFT KNEE ROM:  0-95 degrees AMBULATION DISTANCE: 5 feet using RW with Min assist    History of Present Illness  Gloria Sanchez a 78 y/o female, s/p Left TKA 05/10/18, with the diagnosis of left knee osteoarthritis    Clinical Impression  Patient instructed in and given written instructions for HEP with understanding acknowledged, limited to a few steps at bedside mostly due to c/o nausea, fatigue, and feeling sick to stomach per patient.  Patient tolerated sitting up in chair with LLE dangling after therapy.  Patient will benefit from continued physical therapy in hospital and recommended venue below to increase strength, balance, endurance for safe ADLs and gait.    Follow Up Recommendations SNF;Supervision for mobility/OOB;Supervision/Assistance - 24 hour    Equipment Recommendations  None recommended by PT    Recommendations for Other Services       Precautions / Restrictions Precautions Precautions: Fall Restrictions Weight Bearing Restrictions: Yes LLE Weight Bearing: Weight bearing as tolerated      Mobility  Bed Mobility Overal bed mobility: Needs Assistance Bed Mobility: Supine to Sit     Supine to sit: Min assist     General bed mobility comments: slow labored movement, assistance to move LLE  Transfers Overall transfer level: Needs assistance Equipment used: Rolling walker (2 wheeled) Transfers: Sit to/from [employer] CorporationStand;Stand Pivot Transfers Sit to Stand: Min assist Stand pivot transfers: Min assist       General transfer comment: slow labored movement  Ambulation/Gait Ambulation/Gait assistance: Min assist Gait Distance (Feet): 5 Feet Assistive device: Rolling walker (2 wheeled) Gait Pattern/deviations: Decreased step length - right;Decreased step length - left;Decreased stride length;Decreased stance time - left Gait velocity: slow    General Gait Details: limited to 5-6 slow unsteady labored steps mostly due to fatigue, nausea and feeling sick to stomach  Stairs            Wheelchair Mobility    Modified Rankin (Stroke Patients Only)       Balance Overall balance assessment: Needs assistance Sitting-balance support: Feet supported;No upper extremity supported Sitting balance-Leahy Scale: Fair     Standing balance support: Bilateral upper extremity supported;During functional activity Standing balance-Leahy Scale: Fair Standing balance comment: using RW                             Pertinent Vitals/Pain Pain Assessment: 0-10 Pain Score: 8  Pain Location: left knee Pain Descriptors / Indicators: Sore;Sharp Pain Intervention(s): Limited activity within patient's tolerance;Monitored during session;Patient requesting pain meds-RN notified    Home Living Family/patient expects to be discharged to:: Private residence Living Arrangements: Alone Available Help at Discharge: Family Type of Home: House Home Access: Stairs to enter Entrance Stairs-Rails: Right Entrance Stairs-Number of Steps: 2 Home Layout: One level;Laundry or work area in basement;Other (Comment)(Patient does not go to basement) Home Equipment: Cane - single point;Walker - 2 wheels      Prior Function Level of Independence: Independent with assistive device(s)         Comments: Retail buyerCommunity ambulator using SPC, drives     Hand Dominance        Extremity/Trunk Assessment   Upper Extremity Assessment Upper Extremity Assessment: Overall WFL for tasks assessed    Lower Extremity Assessment Lower Extremity Assessment: Generalized weakness;RLE deficits/detail;LLE deficits/detail RLE Deficits / Details: grossly 5/5 LLE Deficits / Details: grossly -4/5  Cervical / Trunk Assessment Cervical / Trunk Assessment: Normal  Communication   Communication: No difficulties  Cognition Arousal/Alertness:  Awake/alert;Lethargic Behavior During Therapy: WFL for tasks assessed/performed Overall Cognitive Status: Within Functional Limits for tasks assessed                                 General Comments: slightly lethargic possibly due to medications      General Comments      Exercises Total Joint Exercises Ankle Circles/Pumps: Supine;AROM;Left;5 reps;Strengthening Quad Sets: Supine;AROM;Left;5 reps;Strengthening Gluteal Sets: Supine;AROM;Both;5 reps Short Arc Quad: Supine;AAROM;Left;5 reps;Strengthening Heel Slides: Supine;AROM;Strengthening;Left;5 reps   Assessment/Plan    PT Assessment Patient needs continued PT services  PT Problem List Decreased strength;Decreased range of motion;Decreased activity tolerance;Decreased balance;Decreased mobility       PT Treatment Interventions Gait training;Stair training;Functional mobility training;Therapeutic activities;Patient/family education;Therapeutic exercise    PT Goals (Current goals can be found in the Care Plan section)  Acute Rehab PT Goals Patient Stated Goal: return home PT Goal Formulation: With patient/family Time For Goal Achievement: 05/14/18 Potential to Achieve Goals: Good    Frequency 7X/week   Barriers to discharge        Co-evaluation               AM-PAC PT "6 Clicks" Mobility  Outcome Measure Help needed turning from your back to your side while in a flat bed without using bedrails?: A Little Help needed moving from lying on your back to sitting on the side of a flat bed without using bedrails?: A Lot Help needed moving to and from a bed to a chair (including a wheelchair)?: A Lot Help needed standing up from a chair using your arms (e.g., wheelchair or bedside chair)?: A Lot Help needed to walk in hospital room?: A Lot Help needed climbing 3-5 steps with a railing? : A Lot 6 Click Score: 13    End of Session   Activity Tolerance: Patient tolerated treatment well;Patient limited  by fatigue Patient left: in chair;with call bell/phone within reach;with family/visitor present Nurse Communication: Mobility status PT Visit Diagnosis: Unsteadiness on feet (R26.81);Other abnormalities of gait and mobility (R26.89);Muscle weakness (generalized) (M62.81)    Time: 2060-1561 PT Time Calculation (min) (ACUTE ONLY): 31 min   Charges:   PT Evaluation $PT Eval Moderate Complexity: 1 Mod PT Treatments $Therapeutic Activity: 23-37 mins        4:02 PM, 05/10/18 Ocie Bob, MPT Physical Therapist with [facility] [phone] office [phone] mobile phone

## 2018-05-10 NOTE — Anesthesia Postprocedure Evaluation (Signed)
Anesthesia Post Note  Patient: Gloria Sanchez  Procedure(s) Performed: TOTAL KNEE ARTHROPLASTY (Left Knee)  Patient location during evaluation: PACU Anesthesia Type: General Level of consciousness: awake and patient cooperative Pain management: pain level controlled Vital Signs Assessment: post-procedure vital signs reviewed and stable Respiratory status: spontaneous breathing, nonlabored ventilation and respiratory function stable Cardiovascular status: blood pressure returned to baseline Postop Assessment: no apparent nausea or vomiting Anesthetic complications: no     Last Vitals:  Vitals:   05/10/18 1345 05/10/18 1358  BP:  (!) 129/56  Pulse: 82 88  Resp: 20 (!) 23  Temp:    SpO2: 96%     Last Pain:  Vitals:   05/10/18 1358  TempSrc:   PainSc: Asleep                 Margeart Allender J

## 2018-05-10 NOTE — Telephone Encounter (Signed)
I called to check, there is no authorization for the 23 hour observation, but there IS an approval needed for the total knee replacement, which I was not advised of when I called the first time. I have gotten the approval process started. Ref # [id number]

## 2018-05-10 NOTE — Anesthesia Procedure Notes (Signed)
Procedure Name: Intubation Date/Time: 05/10/2018 10:57 AM Performed by: Charmaine Downs, CRNA Pre-anesthesia Checklist: Patient identified, Patient being monitored, Timeout performed, Emergency Drugs available and Suction available Patient Re-evaluated:Patient Re-evaluated prior to induction Oxygen Delivery Method: Circle System Utilized Preoxygenation: Pre-oxygenation with 100% oxygen Induction Type: IV induction Ventilation: Mask ventilation without difficulty Laryngoscope Size: Mac and 4 Grade View: Grade I Tube type: Oral Tube size: 7.0 mm Number of attempts: 1 Airway Equipment and Method: stylet Placement Confirmation: ETT inserted through vocal cords under direct vision,  positive ETCO2 and breath sounds checked- equal and bilateral Secured at: 22 cm Tube secured with: Tape Dental Injury: Teeth and Oropharynx as per pre-operative assessment

## 2018-05-10 NOTE — Telephone Encounter (Signed)
When I called [employer], no insurance approval required for total knee replacement as long as it is 23 hour Obs, so it was changed to 23 hour obs.

## 2018-05-10 NOTE — Op Note (Signed)
05/10/2018  1:04 PM  PATIENT:  Franklyn Lor  78 y.o. female  PRE-OPERATIVE DIAGNOSIS:  left knee osteoarthritis  POST-OPERATIVE DIAGNOSIS:  left knee osteoarthritis  PROCEDURE:  Procedure(s): TOTAL KNEE ARTHROPLASTY (Left) --76734  DEPUY SIGMA FIXED BEARING PS 58F 3T 10 POLY 38 P   SURGEON:  Surgeon(s) and Role:    Vickki Hearing, MD - Primary   The patient was identified by 2 approved identification mechanisms. The operative extremity was evaluated and found to be acceptable for surgical treatment today. The chart was reviewed. The surgical site was confirmed and marked.  The patient was taken to the operating room and given appropriate antibiotic . This is consistent with the SCIP protocol.  The patient was given the following anesthetic: GENERAL  The patient was then placed supine on the operating table. A Foley catheter was inserted. The operative extremity was prepped and draped sterilely from the toes to the groin.  Timeout procedure was executed confirming the patient's name, surgical site, antibiotic administration, x-rays available, and implants available.  The operative limb,  was exsanguinated with a six-inch Esmarch and the tourniquet was inflated to 300 mmHg.  A straight midline incision was made over the LEFT KNEE and taken down to the extensor mechanism. A medial arthrotomy was performed. The patella was everted and the patellofemoral band was released. The anterior cruciate ligament and PCL were resected.  The anterior horns of the lateral and medial meniscus were resected. The medial soft tissue sleeve was elevated to the mid coronal plane.  A three-eighths inch drill bit was used to enter the femoral canal which was decompressed with suction and irrigation until clear. The distal femoral cutting guide was set for 10 mm distal resection,  5valgus alignment, for a LEFT knee. The distal femur was resected and checked for flatness.  The Depuy Sigma sizing  femoral guide was placed and the femur was sized to a size 4 . A 4-in-1 cutting block was placed along with collateral ligament retractors and the distal femoral cuts were completed.  The external alignment guide for the tibial resection was then applied to the distal and proximal tibia and set for anatomic slope along with 10 MM resection  from the  HIGHER LATERAL SIDE .   Rotational alignment was set using the malleolus, the tibial tubercle and the tibial spines.  The proximal tibia was resected along with  residual menisci. The tibia was sized using a base plate to a size  3 .   Spacer blocks were used to confirm equal flexion extension gaps with releases done MEDIALLY around to the posteromedial corner.  The flexion and extension gap was still tight and an additional 2 mm was taken from the tibia.. . A size 10 MM spacer block then gave equal stability and flexion extension.  The correct sized notch cutting guide for the femur was then applied and the notch cut was made.  Trial reduction was completed using 3 tibia 4 femur 10 polyethylene insert trial implants. Patella tracking was normal  We then skeletonized the patella. It measured 25 mm in thickness and the patellar resection was set for 14 millimeters. the patellar resection was completed. The patella diameter measured 13. We then drilled the peg holes for the patella  The proximal tibia was prepared using the size 38 base plate.  Thorough irrigation was performed and the bone was dried and prepared for cement. The cement was mixed on the back table using third generation preparation techniques  20 cc of dilute Exparel was injected into the soft tissues including the posterior capsule.  The implants were then cemented in place and excess cement was removed. The cement was allowed to cure. Irrigation was repeated and excess and residual bone fragments and cement were removed.  A medium-size Hemovac was placed in the joint with 10  openings in the tube left in the joint.  The extensor mechanism was closed with #1 Bralon suture followed by subcutaneous tissue closure using 0 Monocryl suture  30 cc of Marcaine with epinephrine was injected into the joint  Skin approximation was performed using staples  A sterile dressing was applied, followed by a ace wrapped from foot to thigh and then a Cryo/Cuff which was activated   The patient was taken recovery room in stable condition     PHYSICIAN ASSISTANT:   ASSISTANTS: BETTY ASHLEY   ANESTHESIA:   general  EBL:  50 mL   BLOOD ADMINISTERED:none  DRAINS: none   LOCAL MEDICATIONS USED:  EXPAREL AND MARCAINE WITH EPI   SPECIMEN:  No Specimen  DISPOSITION OF SPECIMEN:  N/A  COUNTS:  YES  TOURNIQUET:   Total Tourniquet Time Documented: Thigh (Left) - 90 minutes Total: Thigh (Left) - 90 minutes   DICTATION: .Reubin Milan Dictation  PLAN OF CARE: Admit to inpatient   PATIENT DISPOSITION:  PACU - hemodynamically stable.   Delay start of Pharmacological VTE agent (>24hrs) due to surgical blood loss or risk of bleeding: no

## 2018-05-11 ENCOUNTER — Encounter (HOSPITAL_COMMUNITY): Payer: Self-pay | Admitting: Orthopedic Surgery

## 2018-05-11 LAB — BASIC METABOLIC PANEL
Anion gap: 10 (ref 5–15)
BUN: 16 mg/dL (ref 8–23)
CO2: 27 mmol/L (ref 22–32)
Calcium: 8.3 mg/dL — ABNORMAL LOW (ref 8.9–10.3)
Chloride: 102 mmol/L (ref 98–111)
Creatinine, Ser: 0.81 mg/dL (ref 0.44–1.00)
GFR calc Af Amer: >60 mL/min (ref >60)
GFR calc non Af Amer: >60 mL/min (ref >60)
Glucose, Bld: 112 mg/dL — ABNORMAL HIGH (ref 70–99)
Potassium: 3 mmol/L — ABNORMAL LOW (ref 3.5–5.1)
SODIUM: 139 mmol/L (ref 135–145)

## 2018-05-11 LAB — CBC
HCT: 30.6 % — ABNORMAL LOW (ref 36.0–46.0)
Hemoglobin: 9.8 g/dL — ABNORMAL LOW (ref 12.0–15.0)
MCH: 31.7 pg (ref 26.0–34.0)
MCHC: 32 g/dL (ref 30.0–36.0)
MCV: 99 fL (ref 80.0–100.0)
PLATELETS: 216 10*3/uL (ref 150–400)
RBC: 3.09 MIL/uL — ABNORMAL LOW (ref 3.87–5.11)
RDW: 13.4 % (ref 11.5–15.5)
WBC: 9.9 10*3/uL (ref 4.0–10.5)
nRBC: 0 % (ref 0.0–0.2)

## 2018-05-11 LAB — GLUCOSE, CAPILLARY
GLUCOSE-CAPILLARY: 116 mg/dL — AB (ref 70–99)
Glucose-Capillary: 111 mg/dL — ABNORMAL HIGH (ref 70–99)
Glucose-Capillary: 132 mg/dL — ABNORMAL HIGH (ref 70–99)
Glucose-Capillary: 119 mg/dL — ABNORMAL HIGH (ref 70–99)

## 2018-05-11 NOTE — Anesthesia Postprocedure Evaluation (Signed)
Anesthesia Post Note  Patient: Gloria Sanchez  Procedure(s) Performed: TOTAL KNEE ARTHROPLASTY (Left Knee)  Patient location during evaluation: Nursing Unit Anesthesia Type: General Level of consciousness: awake and alert and oriented Pain management: pain level controlled Vital Signs Assessment: post-procedure vital signs reviewed and stable Respiratory status: spontaneous breathing Cardiovascular status: stable Postop Assessment: no apparent nausea or vomiting Anesthetic complications: no     Last Vitals:  Vitals:   05/11/18 0422 05/11/18 0822  BP: (!) 111/58 (!) 118/53  Pulse: 73 75  Resp: 20 19  Temp: 37.3 C 36.9 C  SpO2: 100% 98%    Last Pain:  Vitals:   05/11/18 0822  TempSrc: Oral  PainSc:                  Axel Meas A

## 2018-05-11 NOTE — Progress Notes (Signed)
Physical Therapy Treatment Patient Details Name: Gloria Sanchez MRN: [id number] DOB: 12/01/1940 Today's Date: 05/11/2018  RIGHT/LEFT KNEE ROM: 0-100 degrees AMBULATION DISTANCE: 45 feet using RW with Min guard assist    History of Present Illness Gloria Sanchez a 78 y/o female, s/p Left TKA 05/10/18, with the diagnosis of left knee osteoarthritis      PT Comments    Patient demonstrates increased endurance/distance for gait training with one near loss of balance mostly due to c/o nausea/dizziness, fair/good return for left heel to toe stepping and tolerated sitting up in chair therapy - RN notified for medication for nausea.  Patient will benefit from continued physical therapy in hospital and recommended venue below to increase strength, balance, endurance for safe ADLs and gait.   Follow Up Recommendations  SNF;Supervision for mobility/OOB;Supervision/Assistance - 24 hour     Equipment Recommendations  None recommended by PT    Recommendations for Other Services       Precautions / Restrictions Precautions Precautions: Fall Restrictions Weight Bearing Restrictions: Yes LLE Weight Bearing: Weight bearing as tolerated    Mobility  Bed Mobility Overal bed mobility: Needs Assistance Bed Mobility: Supine to Sit     Supine to sit: Supervision     General bed mobility comments: required less assistance, but slow labored movement, good return for propping up on elbows to hands during supine to sit  Transfers Overall transfer level: Needs assistance Equipment used: Rolling walker (2 wheeled) Transfers: Sit to/from [employer]Stand;Stand Pivot Transfers Sit to Stand: Min assist Stand pivot transfers: Min guard;Min assist       General transfer comment: slow labored movement  Ambulation/Gait Ambulation/Gait assistance: Min guard Gait Distance (Feet): 45 Feet Assistive device: Rolling walker (2 wheeled) Gait Pattern/deviations: Decreased step length - right;Decreased step length -  left;Decreased stance time - left;Decreased stride length Gait velocity: slow   General Gait Details: increased endurance/distance for ambulation with slow labored cadence and one near loss of balance due to c/o nausea/dizziness while on room air with O2 saturatin between 90-92%   Stairs             Wheelchair Mobility    Modified Rankin (Stroke Patients Only)       Balance Overall balance assessment: Needs assistance Sitting-balance support: Feet supported;No upper extremity supported Sitting balance-Leahy Scale: Good     Standing balance support: Bilateral upper extremity supported;During functional activity Standing balance-Leahy Scale: Fair Standing balance comment: using RW                            Cognition Arousal/Alertness: Awake/alert Behavior During Therapy: WFL for tasks assessed/performed Overall Cognitive Status: Within Functional Limits for tasks assessed                                        Exercises Total Joint Exercises Ankle Circles/Pumps: Supine;AROM;Both;10 reps;Strengthening Quad Sets: Supine;AROM;Left;10 reps;Strengthening Short Arc Quad: Supine;AROM;Strengthening;Left;10 reps Heel Slides: Supine;AROM;Strengthening;Left;10 reps    General Comments        Pertinent Vitals/Pain      Home Living                      Prior Function            PT Goals (current goals can now be found in the care plan section) Acute Rehab PT Goals Patient Stated  Goal: return home PT Goal Formulation: With patient/family Time For Goal Achievement: 05/14/18 Potential to Achieve Goals: Good Progress towards PT goals: Progressing toward goals    Frequency    7X/week      PT Plan Current plan remains appropriate    Co-evaluation              AM-PAC PT "6 Clicks" Mobility   Outcome Measure  Help needed turning from your back to your side while in a flat bed without using bedrails?: None Help  needed moving from lying on your back to sitting on the side of a flat bed without using bedrails?: None Help needed moving to and from a bed to a chair (including a wheelchair)?: A Little Help needed standing up from a chair using your arms (e.g., wheelchair or bedside chair)?: A Little Help needed to walk in hospital room?: A Little Help needed climbing 3-5 steps with a railing? : A Lot 6 Click Score: 19    End of Session   Activity Tolerance: Patient tolerated treatment well;Patient limited by fatigue;Other (comment)(Patient limited by nausea/dizziness) Patient left: in chair;with call bell/phone within reach;with family/visitor present Nurse Communication: Mobility status PT Visit Diagnosis: Unsteadiness on feet (R26.81);Other abnormalities of gait and mobility (R26.89);Muscle weakness (generalized) (M62.81)     Time: 2010-0712 PT Time Calculation (min) (ACUTE ONLY): 31 min  Charges:  $Gait Training: 8-22 mins $Therapeutic Exercise: 8-22 mins                     12:09 PM, 05/11/18 Ocie Bob, MPT Physical Therapist with [facility] [phone] office [phone] mobile phone

## 2018-05-11 NOTE — Progress Notes (Signed)
Physical Therapy Treatment Patient Details Name: Gloria Sanchez MRN: [id number] DOB: [birth year] Today's Date: 05/11/2018  LEFT KNEE ROM:  0-100 degrees AMBULATION DISTANCE: 80 feet using RW with Min guard/supervision    History of Present Illness Gloria Sanchez a 78 y/o female, s/p Left TKA 05/10/18, with the diagnosis of left knee osteoarthritis      PT Comments    Patient had no c/o nausea at beginning of therapy and demonstrates increased endurance/distance for gait training, [facility] prior to gait training, limited mostly due to fatigue and had mild c/o nausea after returning to room.  Patient tolerated CPM set up at 0-65 degrees, limited secondary to c/o increased pain at end range flexion.  Patient will benefit from continued physical therapy in hospital and recommended venue below to increase strength, balance, endurance for safe ADLs and gait.   Follow Up Recommendations  SNF;Supervision for mobility/OOB;Supervision/Assistance - 24 hour     Equipment Recommendations  None recommended by PT    Recommendations for Other Services       Precautions / Restrictions Precautions Precautions: Fall Restrictions Weight Bearing Restrictions: Yes LLE Weight Bearing: Weight bearing as tolerated    Mobility  Bed Mobility Overal bed mobility: Needs Assistance Bed Mobility: Supine to Sit;Sit to Supine     Supine to sit: Supervision Sit to supine: Min assist   General bed mobility comments: requires assistance to move BLE back onto bed  Transfers Overall transfer level: Needs assistance Equipment used: Rolling walker (2 wheeled) Transfers: Sit to/from [employer];Stand Pivot Transfers Sit to Stand: Min assist Stand pivot transfers: Min guard       General transfer comment: has difficulty for sit to stands due to LLE weakness  Ambulation/Gait Ambulation/Gait assistance: Supervision;Min guard Gait Distance (Feet): 80 Feet Assistive device:  Rolling walker (2 wheeled) Gait Pattern/deviations: Decreased step length - right;Decreased step length - left;Decreased stance time - left;Decreased stride length Gait velocity: slow   General Gait Details: increased endurance/distance for ambulation in PM with slow slightly labored cadence, no loss of balance, limited mostly due to c/o fatigue   Stairs             Wheelchair Mobility    Modified Rankin (Stroke Patients Only)       Balance Overall balance assessment: Needs assistance Sitting-balance support: No upper extremity supported;Feet supported Sitting balance-Leahy Scale: Good     Standing balance support: Bilateral upper extremity supported;During functional activity Standing balance-Leahy Scale: Fair Standing balance comment: using RW                            Cognition Arousal/Alertness: Awake/alert Behavior During Therapy: WFL for tasks assessed/performed Overall Cognitive Status: Within Functional Limits for tasks assessed                                        Exercises Total Joint Exercises Ankle Circles/Pumps: Supine;AROM;Both;10 reps;Strengthening Quad Sets: Supine;AROM;Left;10 reps;Strengthening Short Arc Quad: Supine;AROM;Strengthening;Left;10 reps Heel Slides: Supine;AROM;Strengthening;Left;10 reps    General Comments        Pertinent Vitals/Pain Pain Assessment: 0-10 Pain Score: 5  Pain Location: left knee Pain Descriptors / Indicators: Sore Pain Intervention(s): Limited activity within patient's tolerance;Monitored during session    Home Living  Prior Function            PT Goals (current goals can now be found in the care plan section) Acute Rehab PT Goals Patient Stated Goal: return home PT Goal Formulation: With patient/family Time For Goal Achievement: 05/14/18 Potential to Achieve Goals: Good Progress towards PT goals: Progressing toward goals     Frequency    7X/week      PT Plan Current plan remains appropriate    Co-evaluation              AM-PAC PT "6 Clicks" Mobility   Outcome Measure  Help needed turning from your back to your side while in a flat bed without using bedrails?: None Help needed moving from lying on your back to sitting on the side of a flat bed without using bedrails?: None Help needed moving to and from a bed to a chair (including a wheelchair)?: A Little Help needed standing up from a chair using your arms (e.g., wheelchair or bedside chair)?: A Little Help needed to walk in hospital room?: A Little Help needed climbing 3-5 steps with a railing? : A Lot 6 Click Score: 19    End of Session   Activity Tolerance: Patient tolerated treatment well;Patient limited by fatigue Patient left: in bed;with call bell/phone within reach;with family/visitor present;in CPM Nurse Communication: Mobility status PT Visit Diagnosis: Unsteadiness on feet (R26.81);Other abnormalities of gait and mobility (R26.89);Muscle weakness (generalized) (M62.81)     Time: 1423-9532 PT Time Calculation (min) (ACUTE ONLY): 31 min  Charges:  $Gait Training: 8-22 mins $Therapeutic Exercise: 8-22 mins $Therapeutic Activity: 8-22 mins                     3:37 PM, 05/11/18 Ocie Bob, MPT Physical Therapist with [facility] [phone] office [phone] mobile phone

## 2018-05-11 NOTE — NC FL2 (Signed)
[place] MEDICAID FL2 LEVEL OF CARE SCREENING TOOL     IDENTIFICATION  Patient Name: Gloria Sanchez Birthdate: 1940-05-20 Sex: female Admission Date (Current Location): 05/10/2018  [facility] and [place] Number:  [employer] and Address:  [facility],  [address], [place] [address]      Provider Number: [id number]  Attending Physician Name and Address:  Vickki Hearing, MD  Relative Name and Phone Number:       Current Level of Care: Hospital Recommended Level of Care: Skilled Nursing Facility Prior Approval Number:    Date Approved/Denied:   PASRR Number: [id number]  Discharge Plan: SNF    Current Diagnoses: Patient Active Problem List   Diagnosis Date Noted  . Primary osteoarthritis of knee 05/10/2018  . Primary osteoarthritis of left knee   . Postop check 01/06/2017  . Pelvic organ prolapse quantification stage 3 cystocele 12/29/2016  . SUI (stress urinary incontinence, female) 12/04/2016  . Baden-Walker grade 3 cystocele 11/18/2016  . Essential hypertension 05/22/2016  . High cholesterol 05/22/2016  . Common bile duct dilation 05/22/2016  . KNEE PAIN 10/24/2007  . Status post total knee replacement, right 2005 10/24/2007    Orientation RESPIRATION BLADDER Height & Weight     Self, Time, Situation, Place  Normal Continent Weight: 208 lb 12.4 oz (94.7 kg) Height:     BEHAVIORAL SYMPTOMS/MOOD NEUROLOGICAL BOWEL NUTRITION STATUS      Continent Diet(regular)  AMBULATORY STATUS COMMUNICATION OF NEEDS Skin   Limited Assist Verbally Surgical wounds(left knee)                       Personal Care Assistance Level of Assistance  Bathing, Feeding, Dressing   Feeding assistance: Independent Dressing Assistance: Limited assistance     Functional Limitations Info  Sight, Hearing, Speech Sight Info: Adequate Hearing Info: Adequate Speech Info: Adequate    SPECIAL CARE FACTORS FREQUENCY  PT (By licensed PT)     PT  Frequency: 5x/week              Contractures Contractures Info: Not present    Additional Factors Info  Code Status, Allergies Code Status Info: Full Code Allergies Info: NKA           Current Medications (05/11/2018):  This is the current hospital active medication list Current Facility-Administered Medications  Medication Dose Route Frequency Provider Last Rate Last Dose  . alum & mag hydroxide-simeth (MAALOX/MYLANTA) 200-200-20 MG/5ML suspension 30 mL  30 mL Oral Q4H PRN Vickki Hearing, MD      . amLODipine (NORVASC) tablet 5 mg  5 mg Oral Daily Vickki Hearing, MD   5 mg at 05/11/18 2563  . aspirin EC tablet 325 mg  325 mg Oral Q breakfast Vickki Hearing, MD   325 mg at 05/11/18 8937  . calcium carbonate (TUMS - dosed in mg elemental calcium) chewable tablet 200 mg of elemental calcium  1 tablet Oral TID PRN Vickki Hearing, MD      . clonazePAM [facility]) tablet 0.25-0.5 mg  0.25-0.5 mg Oral QHS PRN Vickki Hearing, MD      . docusate sodium (COLACE) capsule 100 mg  100 mg Oral BID Vickki Hearing, MD   100 mg at 05/11/18 0939  . gabapentin (NEURONTIN) capsule 300 mg  300 mg Oral TID Vickki Hearing, MD   300 mg at 05/11/18 1529  . hydrochlorothiazide (HYDRODIURIL) tablet 25 mg  25 mg Oral Daily  Vickki Hearing, MD   25 mg at 05/11/18 [phone]  . HYDROcodone-acetaminophen (NORCO/VICODIN) 5-325 MG per tablet 1-2 tablet  1-2 tablet Oral Q4H PRN Vickki Hearing, MD   2 tablet at 05/10/18 1554  . losartan (COZAAR) tablet 100 mg  100 mg Oral Daily Vickki Hearing, MD   100 mg at 05/11/18 3888  . menthol-cetylpyridinium (CEPACOL) lozenge 3 mg  1 lozenge Oral PRN Vickki Hearing, MD       Or  . phenol (CHLORASEPTIC) mouth spray 1 spray  1 spray Mouth/Throat PRN Vickki Hearing, MD      . metFORMIN (GLUCOPHAGE-XR) 24 hr tablet 500 mg  500 mg Oral QHS Vickki Hearing, MD   500 mg at 05/10/18 2102  . methocarbamol (ROBAXIN) tablet 500 mg   500 mg Oral Q6H PRN Vickki Hearing, MD       Or  . methocarbamol (ROBAXIN) 500 mg in dextrose 5 % 50 mL IVPB  500 mg Intravenous Q6H PRN Vickki Hearing, MD      . metoCLOPramide (REGLAN) tablet 5-10 mg  5-10 mg Oral Q8H PRN Vickki Hearing, MD       Or  . metoCLOPramide (REGLAN) injection 5-10 mg  5-10 mg Intravenous Q8H PRN Vickki Hearing, MD      . morphine 2 MG/ML injection 0.5-1 mg  0.5-1 mg Intravenous Q2H PRN Vickki Hearing, MD      . ondansetron [facility]) tablet 4 mg  4 mg Oral Q6H PRN Vickki Hearing, MD   4 mg at 05/11/18 1111   Or  . ondansetron (ZOFRAN) injection 4 mg  4 mg Intravenous Q6H PRN Vickki Hearing, MD      . pantoprazole (PROTONIX) EC tablet 40 mg  40 mg Oral Daily Vickki Hearing, MD   40 mg at 05/11/18 2800  . senna-docusate (Senokot-S) tablet 1 tablet  1 tablet Oral QHS PRN Vickki Hearing, MD      . simvastatin (ZOCOR) tablet 20 mg  20 mg Oral QHS Vickki Hearing, MD   20 mg at 05/10/18 2102  . traMADol (ULTRAM) tablet 50 mg  50 mg Oral Q6H Vickki Hearing, MD   50 mg at 05/11/18 1529     Discharge Medications: Please see discharge summary for a list of discharge medications.  Relevant Imaging Results:  Relevant Lab Results:   Additional Information SSN [id number], Juleen China, LCSW

## 2018-05-11 NOTE — Clinical Social Work Note (Signed)
Clinical Social Work Assessment  Patient Details  Name: Gloria Sanchez MRN: [id number] Date of Birth: 12-03-1940  Date of referral:  05/11/18               Reason for consult:  Facility Placement                Permission sought to share information with:    Permission granted to share information::     Name::        Agency::     Relationship::     Contact Information:     Housing/Transportation Living arrangements for the past 2 months:  Single Family Home Source of Information:  Patient Patient Interpreter Needed:  None Criminal Activity/Legal Involvement Pertinent to Current Situation/Hospitalization:  No - Comment as needed Significant Relationships:  Adult Children Lives with:  Self Do you feel safe going back to the place where you live?  Yes Need for family participation in patient care:  Yes (Comment)  Care giving concerns:  None identified at baseline.    Social Worker assessment / plan: At baseline, patient uses a cane and is independent in her ADLs. She is agreeable to short term rehab at [facility].   Employment status:  Retired Database administrator PT Recommendations:  Skilled Nursing Facility Information / Referral to community resources:  Skilled Nursing Facility  Patient/Family's Response to care:  Patient is agreeable to short term rehab.   Patient/Family's Understanding of and Emotional Response to Diagnosis, Current Treatment, and Prognosis:  Patient understands her diagnosis, treatment and prognosis and feels that she is need of short term rehab.   Emotional Assessment Appearance:  Appears stated age Attitude/Demeanor/Rapport:    Affect (typically observed):  Accepting, Calm Orientation:  Oriented to Self, Oriented to Place, Oriented to  Time, Oriented to Situation Alcohol / Substance use:  Not Applicable Psych involvement (Current and /or in the community):     Discharge Needs  Concerns to be addressed:  Discharge Planning  Concerns Readmission within the last 30 days:  No Current discharge risk:  None Barriers to Discharge:  No Barriers Identified   Annice Needy, LCSW 05/11/2018, 5:00 PM

## 2018-05-11 NOTE — Addendum Note (Signed)
Addendum  created 05/11/18 1018 by Earleen Newport, CRNA   Clinical Note Signed

## 2018-05-11 NOTE — Progress Notes (Signed)
Patient ID: Gloria Sanchez, female   DOB: 19-Feb-1941, 78 y.o.   MRN: [id number] BP (!) 111/58 (BP Location: Right Arm)   Pulse 73   Temp 99.2 F (37.3 C) (Oral)   Resp 20   Wt 94.7 kg   SpO2 100%   BMI 39.45 kg/m   Postop day 1 left total knee  CBC Latest Ref Rng & Units 05/11/2018 05/06/2018 12/30/2016  WBC 4.0 - 10.5 K/uL 9.9 9.0 11.0(H)  Hemoglobin 12.0 - 15.0 g/dL 0.3(T) 46.5 11.3(L)  Hematocrit 36.0 - 46.0 % 30.6(L) 38.9 33.6(L)  Platelets 150 - 400 K/uL 216 289 215    BMP Latest Ref Rng & Units 05/11/2018 05/06/2018 12/30/2016  Glucose 70 - 99 mg/dL 681(E) 92 751(Z)  BUN 8 - 23 mg/dL 16 20 8   Creatinine 0.44 - 1.00 mg/dL 0.01 7.49 4.49  BUN/Creat Ratio 12 - 28 - - -  Sodium 135 - 145 mmol/L 139 137 136  Potassium 3.5 - 5.1 mmol/L 3.0(L) 3.3(L) 3.3(L)  Chloride 98 - 111 mmol/L 102 101 103  CO2 22 - 32 mmol/L 27 27 27   Calcium 8.9 - 10.3 mg/dL 8.3(L) 9.4 8.6(L)   Glucoses in good range hemoglobin is 9.8  Therapy 95 degrees of flexion 5 feet walking yesterday continue therapy.  Discharge hopefully tomorrow

## 2018-05-11 NOTE — Clinical Social Work Placement (Signed)
   CLINICAL SOCIAL WORK PLACEMENT  NOTE  Date:  05/11/2018  Patient Details  Name: Gloria Sanchez MRN: [id number] Date of Birth: [birth year]  Clinical Social Work is seeking post-discharge placement for this patient at the Skilled  Nursing Facility level of care (*CSW will initial, date and re-position this form in  chart as items are completed):  Yes   Patient/family provided with [place] [facility] Clinical Social Work Department's list of facilities offering this level of care within the geographic area requested by the patient (or if unable, by the patient's family).  Yes   Patient/family informed of their freedom to choose among providers that offer the needed level of care, that participate in Medicare, Medicaid or managed care program needed by the patient, have an available bed and are willing to accept the patient.  Yes   Patient/family informed of [facility]'s ownership interest in [facility] and [facility], as well as of the fact that they are under no obligation to receive care at these facilities.  PASRR submitted to EDS on 05/11/18     PASRR number received on 05/11/18     Existing PASRR number confirmed on       FL2 transmitted to all facilities in geographic area requested by pt/family on 05/11/18     FL2 transmitted to all facilities within larger geographic area on       Patient informed that his/her managed care company has contracts with or will negotiate with certain facilities, including the following:            Patient/family informed of bed offers received.  Patient chooses bed at       Physician recommends and patient chooses bed at      Patient to be transferred to   on  .  Patient to be transferred to facility by       Patient family notified on   of transfer.  Name of family member notified:        PHYSICIAN       Additional Comment:    _______________________________________________ Annice NeedySettle, Latoy Labriola D, LCSW 05/11/2018, 4:58 PM

## 2018-05-11 NOTE — Telephone Encounter (Signed)
They have faxed request for clinicals so I have faxed to [facility] [phone]

## 2018-05-12 MED ORDER — CLONAZEPAM 0.5 MG PO TABS: 0.2500 mg | ORAL_TABLET | Freq: Every evening | ORAL | 0 refills | Status: DC | PRN

## 2018-05-12 MED ORDER — HYDROCODONE-ACETAMINOPHEN 5-325 MG PO TABS: 1.0000 | ORAL_TABLET | ORAL | 0 refills | Status: DC | PRN

## 2018-05-12 MED ORDER — GABAPENTIN 300 MG PO CAPS: 300.0000 mg | ORAL_CAPSULE | Freq: Every day | ORAL | 0 refills | Status: DC

## 2018-05-12 MED ORDER — METHOCARBAMOL 500 MG PO TABS: 500.0000 mg | ORAL_TABLET | Freq: Four times a day (QID) | ORAL | 0 refills | Status: DC | PRN

## 2018-05-12 MED ORDER — POTASSIUM CHLORIDE CRYS ER 20 MEQ PO TBCR
20.0000 meq | EXTENDED_RELEASE_TABLET | Freq: Three times a day (TID) | ORAL | Status: DC
  Administered 2018-05-12 – 2018-05-13 (×4): 20 meq via ORAL
  Filled 2018-05-12 (×4): qty 1

## 2018-05-12 MED ORDER — POTASSIUM CHLORIDE CRYS ER 20 MEQ PO TBCR: 20.0000 meq | EXTENDED_RELEASE_TABLET | Freq: Three times a day (TID) | ORAL | 0 refills | Status: DC

## 2018-05-12 MED ORDER — DOCUSATE SODIUM 100 MG PO CAPS: 100.0000 mg | ORAL_CAPSULE | Freq: Two times a day (BID) | ORAL | 0 refills | Status: DC

## 2018-05-12 MED ORDER — ASPIRIN 325 MG PO TBEC: 325.0000 mg | DELAYED_RELEASE_TABLET | Freq: Every day | ORAL | 0 refills | Status: DC

## 2018-05-12 NOTE — Discharge Summary (Signed)
Physician Discharge Summary  Patient ID: Gloria Sanchez MRN: [id number] DOB/AGE: 76-22-1942 76 y.o.  Admit date: 05/10/2018 Discharge date: 05/12/2018  Admission Diagnoses: Osteoarthritis left knee  Discharge Diagnoses:  -Osteoarthritis left knee -Hypokalemia  Discharged Condition: good  Procedure: Surgery date February 11: left total knee,DEPUY Sigma fixed bearing posterior stabilized total knee system  Hospital Course:  On hospital day 1 February 11 the patient underwent left total knee replacement with no complications under general anesthesia. On hospital day 2 February 12 patient ambulated well with physical therapy with good range of motion of 100 degrees  Physical Therapy Treatment Patient Details Name: Gloria Sanchez MRN: [id number] DOB: 03-21-1941 Today's Date: 05/11/2018   LEFT KNEE ROM:  0-100 degrees AMBULATION DISTANCE: 80 feet using RW with Min guard/supervision       History of Present Illness Gloria Sanchez a 78 y/o female, s/p Left TKA 05/10/18, with the diagnosis of left knee osteoarthritis         Hospital day 3 February 13 the patient was hypokalemic and was started on potassium 20 mEq 3 times a day and was giving a breathing treatment for atelectasis.  Incision scant drainage calf supple soft nontender  Blood pressure 124/60, pulse 100, temperature 99.4 F (37.4 C), temperature source Oral, resp. rate 18, weight 94.7 kg, SpO2 90 %.   Disposition: Skilled nursing care   Allergies as of 05/12/2018   No Known Allergies     Medication List    STOP taking these medications   acetaminophen 500 MG tablet Commonly known as:  TYLENOL   meloxicam 7.5 MG tablet Commonly known as:  MOBIC     TAKE these medications   amLODipine 5 MG tablet Commonly known as:  NORVASC Take 5 mg by mouth daily.   aspirin 325 MG EC tablet Take 1 tablet (325 mg total) by mouth daily with breakfast.   calcium carbonate 500 MG chewable tablet Commonly known as:  TUMS -  dosed in mg elemental calcium Chew 1 tablet by mouth 3 (three) times daily as needed for indigestion or heartburn.   clonazePAM 0.5 MG tablet Commonly known as:  KLONOPIN Take 0.5-1 tablets (0.25-0.5 mg total) by mouth at bedtime as needed (for restless leg syndrome).   docusate sodium 100 MG capsule Commonly known as:  COLACE Take 1 capsule (100 mg total) by mouth 2 (two) times daily.   gabapentin 300 MG capsule Commonly known as:  NEURONTIN Take 1 capsule (300 mg total) by mouth at bedtime. What changed:  See the new instructions.   hydrochlorothiazide 25 MG tablet Commonly known as:  HYDRODIURIL Take 25 mg by mouth daily.   HYDROcodone-acetaminophen 5-325 MG tablet Commonly known as:  NORCO/VICODIN Take 1 tablet by mouth every 4 (four) hours as needed for moderate pain.   losartan 100 MG tablet Commonly known as:  COZAAR Take 100 mg by mouth daily.   metFORMIN 500 MG 24 hr tablet Commonly known as:  GLUCOPHAGE-XR Take 500 mg by mouth at bedtime.   methocarbamol 500 MG tablet Commonly known as:  ROBAXIN Take 1 tablet (500 mg total) by mouth every 6 (six) hours as needed for muscle spasms.   pantoprazole 40 MG tablet Commonly known as:  PROTONIX Take 40 mg by mouth daily.   potassium chloride SA 20 MEQ tablet Commonly known as:  K-DUR,KLOR-CON Take 1 tablet (20 mEq total) by mouth 3 (three) times daily.   simvastatin 20 MG tablet Commonly known as:  ZOCOR Take 20 mg  by mouth at bedtime.      Follow-up Information    Vickki Hearing, MD Follow up.   Specialties:  Orthopedic Surgery, Radiology Contact information: [address] [place] [address] [phone]           Signed: Fuller Canada 05/12/2018, 8:22 AM

## 2018-05-12 NOTE — Progress Notes (Signed)
Patient ID: Gloria Sanchez, female   DOB: 09/05/1940, 78 y.o.   MRN: [id number] Postop day 2 status post left total knee patient has agreed to go to skilled care  BP 124/60 (BP Location: Right Arm)   Pulse 100   Temp 99.4 F (37.4 C) (Oral)   Resp 18   Wt 94.7 kg   SpO2 90%   BMI 39.45 kg/m   CBC Latest Ref Rng & Units 05/11/2018 05/06/2018 12/30/2016  WBC 4.0 - 10.5 K/uL 9.9 9.0 11.0(H)  Hemoglobin 12.0 - 15.0 g/dL 8.3(F) 38.3 11.3(L)  Hematocrit 36.0 - 46.0 % 30.6(L) 38.9 33.6(L)  Platelets 150 - 400 K/uL 216 289 215    BMP Latest Ref Rng & Units 05/11/2018 05/06/2018 12/30/2016  Glucose 70 - 99 mg/dL 291(B) 92 166(M)  BUN 8 - 23 mg/dL 16 20 8   Creatinine 0.44 - 1.00 mg/dL 6.00 4.59 9.77  BUN/Creat Ratio 12 - 28 - - -  Sodium 135 - 145 mmol/L 139 137 136  Potassium 3.5 - 5.1 mmol/L 3.0(L) 3.3(L) 3.3(L)  Chloride 98 - 111 mmol/L 102 101 103  CO2 22 - 32 mmol/L 27 27 27   Calcium 8.9 - 10.3 mg/dL 8.3(L) 9.4 8.6(L)    Hypokalemia will replace with oral potassium I would like her to have a respiratory treatment  Should be able to go to skilled care today

## 2018-05-12 NOTE — Progress Notes (Signed)
Physical Therapy Treatment Patient Details Name: Gloria Sanchez MRN: [id number] DOB: [birth year] Today's Date: 05/12/2018   RIGHT/LEFT KNEE ROM: 0-100 degrees AMBULATION DISTANCE: 100 feet using RW with Min Guard assist     History of Present Illness Gloria Sanchez a 78 y/o female, s/p Left TKA 05/10/18, with the diagnosis of left knee osteoarthritis      PT Comments    Patient presents up in chair (assisted by nursing staff), demonstrates increased endurance/distance for gait training with fair/good return for left heel to toe stepping and limited mostly due to c/o fatigue and mild nausea.  Patient requested to go back to bed after gait training due to fatigue.  Patient will benefit from continued physical therapy in hospital and recommended venue below to increase strength, balance, endurance for safe ADLs and gait.   Follow Up Recommendations  SNF;Supervision for mobility/OOB;Supervision/Assistance - 24 hour     Equipment Recommendations  None recommended by PT    Recommendations for Other Services       Precautions / Restrictions Precautions Precautions: Fall Restrictions Weight Bearing Restrictions: Yes LLE Weight Bearing: Weight bearing as tolerated    Mobility  Bed Mobility Overal bed mobility: Needs Assistance Bed Mobility: Sit to Supine       Sit to supine: Min assist   General bed mobility comments: requires assistance to move BLE back onto bed  Transfers Overall transfer level: Needs assistance Equipment used: Rolling walker (2 wheeled) Transfers: Sit to/from [employer] CorporationStand;Stand Pivot Transfers Sit to Stand: Min assist Stand pivot transfers: Min guard       General transfer comment: slow labored movement  Ambulation/Gait Ambulation/Gait assistance: Supervision;Min guard Gait Distance (Feet): 100 Feet Assistive device: Rolling walker (2 wheeled) Gait Pattern/deviations: Decreased step length - right;Decreased step length - left;Decreased stance time -  left;Decreased stride length Gait velocity: slow   General Gait Details: increased endurance/distance for ambulation with slow slightly labored cadence, no loss of balance, limited mostly due to c/o fatigue   Stairs             Wheelchair Mobility    Modified Rankin (Stroke Patients Only)       Balance Overall balance assessment: Needs assistance Sitting-balance support: No upper extremity supported;Feet supported Sitting balance-Leahy Scale: Good     Standing balance support: Bilateral upper extremity supported;During functional activity Standing balance-Leahy Scale: Fair Standing balance comment: using RW                            Cognition Arousal/Alertness: Awake/alert Behavior During Therapy: WFL for tasks assessed/performed Overall Cognitive Status: Within Functional Limits for tasks assessed                                        Exercises Total Joint Exercises Ankle Circles/Pumps: Supine;AROM;10 reps;Strengthening;Left Quad Sets: Supine;AROM;Left;10 reps;Strengthening Short Arc Quad: Supine;AROM;Strengthening;Left;10 reps Heel Slides: Supine;AROM;Strengthening;Left;10 reps Goniometric ROM: left knee 0-100 degrees    General Comments        Pertinent Vitals/Pain Pain Assessment: 0-10 Pain Score: 6  Pain Location: left knee Pain Descriptors / Indicators: Sore Pain Intervention(s): Limited activity within patient's tolerance;Monitored during session    Home Living                      Prior Function  PT Goals (current goals can now be found in the care plan section) Acute Rehab PT Goals Patient Stated Goal: return home Time For Goal Achievement: 05/14/18 Potential to Achieve Goals: Good Progress towards PT goals: Progressing toward goals    Frequency    7X/week      PT Plan Current plan remains appropriate    Co-evaluation              AM-PAC PT "6 Clicks" Mobility   Outcome  Measure  Help needed turning from your back to your side while in a flat bed without using bedrails?: None Help needed moving from lying on your back to sitting on the side of a flat bed without using bedrails?: None Help needed moving to and from a bed to a chair (including a wheelchair)?: A Little Help needed standing up from a chair using your arms (e.g., wheelchair or bedside chair)?: A Little Help needed to walk in hospital room?: A Little Help needed climbing 3-5 steps with a railing? : A Lot 6 Click Score: 19    End of Session   Activity Tolerance: Patient tolerated treatment well;Patient limited by fatigue Patient left: in bed;with call bell/phone within reach;with family/visitor present Nurse Communication: Mobility status PT Visit Diagnosis: Unsteadiness on feet (R26.81);Other abnormalities of gait and mobility (R26.89);Muscle weakness (generalized) (M62.81)     Time: 4287-6811 PT Time Calculation (min) (ACUTE ONLY): 26 min  Charges:  $Gait Training: 8-22 mins $Therapeutic Exercise: 8-22 mins                     12:17 PM, 05/12/18 Ocie Bob, MPT Physical Therapist with [facility] [phone] office [phone] mobile phone

## 2018-05-12 NOTE — Clinical Social Work Note (Signed)
Patient is awaiting [employer] authorization.    Gloria Sanchez D, LCSW  

## 2018-05-13 ENCOUNTER — Inpatient Hospital Stay
Admission: RE | Admit: 2018-05-13 | Discharge: 2018-05-20 | Disposition: A | Discharge status: Home / Self Care | Payer: Medicare Other | Source: Ambulatory Visit | Attending: Internal Medicine | Admitting: Internal Medicine

## 2018-05-13 NOTE — Clinical Social Work Placement (Signed)
   CLINICAL SOCIAL WORK PLACEMENT  NOTE  Date:  05/13/2018  Patient Details  Name: Gloria Sanchez MRN: [id number] Date of Birth: 10-Feb-1941  Clinical Social Work is seeking post-discharge placement for this patient at the Skilled  Nursing Facility level of care (*CSW will initial, date and re-position this form in  chart as items are completed):  Yes   Patient/family provided with [place] Clinical Social Work Department's list of facilities offering this level of care within the geographic area requested by the patient (or if unable, by the patient's family).  Yes   Patient/family informed of their freedom to choose among providers that offer the needed level of care, that participate in Medicare, Medicaid or managed care program needed by the patient, have an available bed and are willing to accept the patient.  Yes   Patient/family informed of [facility]'s ownership interest in [facility] and [facility], as well as of the fact that they are under no obligation to receive care at these facilities.  PASRR submitted to EDS on 05/11/18     PASRR number received on 05/11/18     Existing PASRR number confirmed on       FL2 transmitted to all facilities in geographic area requested by pt/family on 05/11/18     FL2 transmitted to all facilities within larger geographic area on       Patient informed that his/her managed care company has contracts with or will negotiate with certain facilities, including the following:        Yes   Patient/family informed of bed offers received.  Patient chooses bed at [facility]     Physician recommends and patient chooses bed at      Patient to be transferred to [facility] on 05/13/18.  Patient to be transferred to facility by [facility] staff     Patient family notified on 05/13/18 of transfer.  Name of family member notified:  Braille Manson Passey, dtr.      PHYSICIAN       Additional Comment:  Discharge clinicals sent.  Patient going to room 143. LCSW singing off.   _______________________________________________ Tretha Sciara D, LCSW 05/13/2018, 11:12 AM

## 2018-05-13 NOTE — Progress Notes (Signed)
IV discontinued,catheter intact, Discharge instructions given on medications and follow up visits,patient verbalized understanding. Accompanied to awaiting facility.

## 2018-05-13 NOTE — Progress Notes (Signed)
Physical Therapy Treatment Patient Details Name: Gloria Sanchez MRN: [id number] DOB: 02-Dec-1940 Today's Date: 05/13/2018  LEFT KNEE ROM: 0-105 degrees AMBULATION DISTANCE: 120 feet using RW with Min guard/supervised assistance    History of Present Illness Gloria Sanchez a 78 y/o female, s/p Left TKA 05/10/18, with the diagnosis of left knee osteoarthritis      PT Comments    Patient able to achieve increased left knee flexion with end range stretching while seated at bedside, increased endurance/distance for gait training without loss of balance and fair/good return for left heel to toe stepping.  Patient tolerated sitting up in chair with LLE dangling after therapy and family member in room.  Patient will benefit from continued physical therapy in hospital and recommended venue below to increase strength, balance, endurance for safe ADLs and gait.   Follow Up Recommendations  SNF;Supervision for mobility/OOB;Supervision/Assistance - 24 hour     Equipment Recommendations  None recommended by PT    Recommendations for Other Services       Precautions / Restrictions Precautions Precautions: Fall Restrictions Weight Bearing Restrictions: Yes LLE Weight Bearing: Weight bearing as tolerated    Mobility  Bed Mobility Overal bed mobility: Needs Assistance Bed Mobility: Supine to Sit     Supine to sit: Supervision     General bed mobility comments: slow labored movement without assistance  Transfers Overall transfer level: Needs assistance Equipment used: Rolling walker (2 wheeled) Transfers: Sit to/from [employer] Sit to Stand: Min assist Stand pivot transfers: Min guard       General transfer comment: requires assistance for sit to stands due to LLE weakness, knee pain  Ambulation/Gait Ambulation/Gait assistance: Supervision;Min guard Gait Distance (Feet): 120 Feet Assistive device: Rolling walker (2 wheeled) Gait Pattern/deviations: Decreased step  length - right;Decreased step length - left;Decreased stance time - left;Decreased stride length Gait velocity: slow   General Gait Details: increased endurance/distance for ambulation with slow slightly labored cadence, no loss of balance, limited mostly due to c/o fatigue   Stairs             Wheelchair Mobility    Modified Rankin (Stroke Patients Only)       Balance Overall balance assessment: Needs assistance Sitting-balance support: No upper extremity supported;Feet supported Sitting balance-Leahy Scale: Good     Standing balance support: Bilateral upper extremity supported;During functional activity Standing balance-Leahy Scale: Fair Standing balance comment: using RW                            Cognition Arousal/Alertness: Awake/alert Behavior During Therapy: WFL for tasks assessed/performed Overall Cognitive Status: Within Functional Limits for tasks assessed                                        Exercises Total Joint Exercises Ankle Circles/Pumps: Supine;AROM;10 reps;Strengthening;Both Quad Sets: Supine;AROM;Left;10 reps;Strengthening Short Arc Quad: Supine;AROM;Strengthening;Left;10 reps Heel Slides: Supine;AROM;Strengthening;Left;10 reps Goniometric ROM: left knee 0-105    General Comments        Pertinent Vitals/Pain Pain Assessment: 0-10 Pain Score: 5  Pain Location: left knee Pain Descriptors / Indicators: Sore Pain Intervention(s): Limited activity within patient's tolerance;Monitored during session    Home Living                      Prior Function  PT Goals (current goals can now be found in the care plan section) Acute Rehab PT Goals Patient Stated Goal: return home PT Goal Formulation: With patient/family Time For Goal Achievement: 05/17/18 Potential to Achieve Goals: Good Progress towards PT goals: Progressing toward goals    Frequency    7X/week      PT Plan Current plan  remains appropriate    Co-evaluation              AM-PAC PT "6 Clicks" Mobility   Outcome Measure  Help needed turning from your back to your side while in a flat bed without using bedrails?: None Help needed moving from lying on your back to sitting on the side of a flat bed without using bedrails?: None Help needed moving to and from a bed to a chair (including a wheelchair)?: A Little Help needed standing up from a chair using your arms (e.g., wheelchair or bedside chair)?: A Little Help needed to walk in hospital room?: A Little Help needed climbing 3-5 steps with a railing? : A Lot 6 Click Score: 19    End of Session   Activity Tolerance: Patient tolerated treatment well;Patient limited by fatigue Patient left: in bed;with call bell/phone within reach;with family/visitor present Nurse Communication: Mobility status PT Visit Diagnosis: Unsteadiness on feet (R26.81);Other abnormalities of gait and mobility (R26.89);Muscle weakness (generalized) (M62.81)     Time: 4799-8721 PT Time Calculation (min) (ACUTE ONLY): 36 min  Charges:  $Gait Training: 8-22 mins $Therapeutic Exercise: 8-22 mins                     2:07 PM, 05/13/18 Ocie Bob, MPT Physical Therapist with [facility] [phone] office [phone] mobile phone

## 2018-05-13 NOTE — Clinical Social Work Note (Signed)
[facility] has authorization and patient will discharge to private room 143.    Diesel Lina, Juleen China, LCSW

## 2018-05-13 NOTE — Care Management Important Message (Signed)
Important Message  Patient Details  Name: Gloria Sanchez MRN: [id number] Date of Birth: 1940/09/20   Medicare Important Message Given:  Yes    Corey Harold 05/13/2018, 11:10 AM

## 2018-05-16 ENCOUNTER — Non-Acute Institutional Stay (SKILLED_NURSING_FACILITY): Payer: Medicare Other | Admitting: Internal Medicine

## 2018-05-16 ENCOUNTER — Encounter: Payer: Self-pay | Admitting: Internal Medicine

## 2018-05-16 ENCOUNTER — Other Ambulatory Visit: Payer: Self-pay | Admitting: Internal Medicine

## 2018-05-16 DIAGNOSIS — G2581 Restless legs syndrome: Secondary | ICD-10-CM | POA: Diagnosis not present

## 2018-05-16 DIAGNOSIS — E7800 Pure hypercholesterolemia, unspecified: Secondary | ICD-10-CM | POA: Diagnosis not present

## 2018-05-16 DIAGNOSIS — I10 Essential (primary) hypertension: Secondary | ICD-10-CM | POA: Diagnosis not present

## 2018-05-16 NOTE — Progress Notes (Signed)
Provider:  Einar Crow, MD Location:  [facility] Room Number: 143 P Place of Service:  SNF (31)  PCP: Sasser, Clarene Critchley, MD Patient Care Team: Estanislado Pandy, MD as PCP - General (Cardiology)  Extended Emergency Contact Information Primary Emergency Contact: Glo Herring, [place] Home Phone: [phone] Mobile Phone: [phone] Relation: Daughter Secondary Emergency Contact: Sonda Primes          [place], [place] [address] [place] Home Phone: [phone] Mobile Phone: [phone] Relation: Son  Code Status: Full Code Goals of Care: Advanced Directive information Advanced Directives 05/16/2018  Does Patient Have a Medical Advance Directive? No  Would patient like information on creating a medical advance directive? No - Patient declined      Chief Complaint  Patient presents with  . New Admit To SNF    Admission    HPI: Patient is a 78 y.o. female seen today for admission to SNF for therapy She was admitted in the hospital from 02/11-02/13 for left knee arthroplasty. Patient has a history of hypertension, type 2 diabetes, hyperlipidemia, restless leg syndrome, stage III cystocele She was electively admitted by Dr. Romeo Apple for left knee replacement.  Patient says she has been struggling with the pain for past few months.  She was almost using cane for past week.  Patient underwent procedure on 02 /11 Her postop course was uncomplicated. The facility patient was noticed to have some shortness of breath.  X-ray was done and it was negative for any acute changes.  Patient feels much better now does have some dry cough but denies any chest pain shortness of breath. She continues to have pain in her left knee but it is controlled with as needed Norco. Patient is already transferring herself under supervision Patient lives by herself since her husband died in 01/07/2023.  She does have a very supportive sister  and daughter who help her with driving.  She is mostly independent in her ADL  Past Medical History:  Diagnosis Date  . Allergic rhinitis   . Arthritis   . Common bile duct dilation 05/22/2016  . Diverticulosis   . DM (diabetes mellitus) (HCC)   . Essential hypertension 05/22/2016  . GERD (gastroesophageal reflux disease)   . High cholesterol 05/22/2016  . Hyperlipidemia   . Hypertension   . Restless leg syndrome    Past Surgical History:  Procedure Laterality Date  . ANTERIOR AND POSTERIOR REPAIR N/A 12/29/2016   Procedure: ANTERIOR (CYSTOCELE) AND POSTERIOR REPAIR (RECTOCELE);  Surgeon: Tilda Burrow, MD;  Location: [facility];  Service: Gynecology;  Laterality: N/A;  . CHOLECYSTECTOMY    . complete hysterectomy    . ERCP W/ SPHICTEROTOMY  1974  . REPLACEMENT TOTAL KNEE    . TOTAL KNEE ARTHROPLASTY Left 05/10/2018   Procedure: TOTAL KNEE ARTHROPLASTY;  Surgeon: Vickki Hearing, MD;  Location: [facility];  Service: Orthopedics;  Laterality: Left;    reports that she has never smoked. She has never used smokeless tobacco. She reports that she does not drink alcohol or use drugs. Social History   Socioeconomic History  . Marital status: Married    Spouse name: Not on file  . Number of children: Not on file  . Years of education: Not on file  . Highest education level: Not on file  Occupational History  . Not on file  Social Needs  . Financial resource strain: Not on  file  . Food insecurity:    Worry: Not on file    Inability: Not on file  . Transportation needs:    Medical: Not on file    Non-medical: Not on file  Tobacco Use  . Smoking status: Never Smoker  . Smokeless tobacco: Never Used  Substance and Sexual Activity  . Alcohol use: No  . Drug use: No  . Sexual activity: Never    Birth control/protection: Surgical    Comment: hyst  Lifestyle  . Physical activity:    Days per week: Not on file    Minutes per session: Not on file  . Stress: Not on file    Relationships  . Social connections:    Talks on phone: Not on file    Gets together: Not on file    Attends religious service: Not on file    Active member of club or organization: Not on file    Attends meetings of clubs or organizations: Not on file    Relationship status: Not on file  . Intimate partner violence:    Fear of current or ex partner: Not on file    Emotionally abused: Not on file    Physically abused: Not on file    Forced sexual activity: Not on file  Other Topics Concern  . Not on file  Social History Narrative  . Not on file    Functional Status Survey:    Family History  Problem Relation Age of Onset  . Diabetes Sister   . Emphysema Father   . Dementia Mother   . Hypertension Mother   . COPD Brother   . Heart attack Brother   . Breast cancer Sister   . Diabetes Son     Health Maintenance  Topic Date Due  . INFLUENZA VACCINE  06/14/2018 (Originally 10/28/2017)  . DEXA SCAN  06/14/2018 (Originally 11/12/2007)  . TETANUS/TDAP  06/14/2018 (Originally 11/11/1961)  . PNA vac Low Risk Adult (1 of 2 - PCV13) 06/14/2018 (Originally 11/12/2007)  . COLONOSCOPY  08/16/2023    No Known Allergies  Outpatient Encounter Medications as of 05/16/2018  Medication Sig  . amLODipine (NORVASC) 5 MG tablet Take 5 mg by mouth daily.  Marland Kitchen aspirin EC 325 MG EC tablet Take 1 tablet (325 mg total) by mouth daily with breakfast.  . calcium carbonate (TUMS - DOSED IN MG ELEMENTAL CALCIUM) 500 MG chewable tablet Chew 1 tablet by mouth 3 (three) times daily as needed for indigestion or heartburn.  . clonazePAM (KLONOPIN) 0.5 MG tablet Take 0.25 mg by mouth at bedtime as needed for anxiety.  . docusate sodium (COLACE) 100 MG capsule Take 1 capsule (100 mg total) by mouth 2 (two) times daily.  Marland Kitchen gabapentin (NEURONTIN) 300 MG capsule Take 1 capsule (300 mg total) by mouth at bedtime.  Marland Kitchen guaiFENesin (MUCINEX) 600 MG 12 hr tablet Take 600 mg by mouth 2 (two) times daily.  .  hydrochlorothiazide (HYDRODIURIL) 25 MG tablet Take 25 mg by mouth daily.  Marland Kitchen HYDROcodone-acetaminophen (NORCO/VICODIN) 5-325 MG tablet Take 1 tablet by mouth every 4 (four) hours as needed for moderate pain.  Marland Kitchen ipratropium (ATROVENT) 0.02 % nebulizer solution Inhale 1 vial every 6 hours as needed for wheezing  . losartan (COZAAR) 100 MG tablet Take 100 mg by mouth daily.  . metFORMIN (GLUCOPHAGE-XR) 500 MG 24 hr tablet Take 500 mg by mouth at bedtime.  . methocarbamol (ROBAXIN) 500 MG tablet Take 1 tablet (500 mg total) by mouth every 6 (  six) hours as needed for muscle spasms.  . NON FORMULARY Diet Type:  NAS  . OXYGEN Inhale 2 L/min into the lungs as needed. To keep o2 sat above 90%  . pantoprazole (PROTONIX) 40 MG tablet Take 40 mg by mouth daily.  . potassium chloride SA (K-DUR,KLOR-CON) 20 MEQ tablet Take 1 tablet (20 mEq total) by mouth 3 (three) times daily.  . simvastatin (ZOCOR) 20 MG tablet Take 20 mg by mouth at bedtime.    . [DISCONTINUED] clonazePAM (KLONOPIN) 0.5 MG tablet Take 0.5-1 tablets (0.25-0.5 mg total) by mouth at bedtime as needed (for restless leg syndrome). (Patient not taking: Reported on 05/16/2018)   No facility-administered encounter medications on file as of 05/16/2018.     Review of Systems  Review of Systems  Constitutional: Negative for activity change, appetite change, chills, diaphoresis, fatigue and fever.  HENT: Negative for mouth sores, postnasal drip, rhinorrhea, sinus pain and sore throat.   Respiratory: Negative for apnea, cough, chest tightness, shortness of breath and wheezing.   Cardiovascular: Negative for chest pain, palpitations and leg swelling.  Gastrointestinal: Negative for abdominal distention, abdominal pain, constipation, diarrhea, nausea and vomiting.  Genitourinary: Negative for dysuria and frequency.  Musculoskeletal: Negative for arthralgias, joint swelling and myalgias.  Skin: Negative for rash.  Neurological: Negative for dizziness,  syncope, weakness, light-headedness and numbness.  Psychiatric/Behavioral: Negative for behavioral problems, confusion and sleep disturbance.     Vitals:   05/16/18 1025  BP: (!) 139/53  Pulse: 66  Resp: 20  Temp: 98.3 F (36.8 C)  Weight: 208 lb 12 oz (94.7 kg)  Height: 5\' 1"  (1.549 m)   Body mass index is 39.44 kg/m. Physical Exam Constitutional:      Appearance: Normal appearance.  HENT:     Head: Normocephalic.     Nose: Nose normal.     Mouth/Throat:     Mouth: Mucous membranes are moist.     Pharynx: Oropharynx is clear.  Eyes:     Pupils: Pupils are equal, round, and reactive to light.  Neck:     Musculoskeletal: Neck supple.  Cardiovascular:     Rate and Rhythm: Normal rate and regular rhythm.     Pulses: Normal pulses.     Heart sounds: Normal heart sounds.  Pulmonary:     Effort: Pulmonary effort is normal. No respiratory distress.     Breath sounds: Normal breath sounds. No wheezing or rales.  Abdominal:     General: Abdomen is flat. Bowel sounds are normal.     Palpations: Abdomen is soft.  Musculoskeletal:     Comments: Trace Edema Bilateral  Skin:    General: Skin is warm and dry.     Comments: Incision looks Good. Has some redness around.  Neurological:     General: No focal deficit present.     Mental Status: She is alert and oriented to person, place, and time.  Psychiatric:        Mood and Affect: Mood normal.        Thought Content: Thought content normal.        Judgment: Judgment normal.     Labs reviewed: Basic Metabolic Panel: Recent Labs    05/06/18 1336 05/11/18 0515  NA 137 139  K 3.3* 3.0*  CL 101 102  CO2 27 27  GLUCOSE 92 112*  BUN 20 16  CREATININE 0.76 0.81  CALCIUM 9.4 8.3*   Liver Function Tests: No results for input(s): AST, ALT, ALKPHOS, BILITOT, PROT, ALBUMIN  in the last 8760 hours. No results for input(s): LIPASE, AMYLASE in the last 8760 hours. No results for input(s): AMMONIA in the last 8760  hours. CBC: Recent Labs    05/06/18 1336 05/11/18 0515  WBC 9.0 9.9  NEUTROABS 5.0  --   HGB 12.6 9.8*  HCT 38.9 30.6*  MCV 97.3 99.0  PLT 289 216   Cardiac Enzymes: No results for input(s): CKTOTAL, CKMB, CKMBINDEX, TROPONINI in the last 8760 hours. BNP: Invalid input(s): POCBNP No results found for: HGBA1C No results found for: TSH No results found for: VITAMINB12 No results found for: FOLATE No results found for: IRON, TIBC, FERRITIN  Imaging and Procedures obtained prior to SNF admission: No results found.  Assessment/Plan  Right Knee Arthroplasty Pain Controlled on Norco Continue therapy. Ice Packs. Hypertension Controlled on Losartan, HCTZ, And Amlodipine Diabetes Mellitus On Metformin Follows with her PCP Hyperlipidemia On Statin Neuropathy On Neurontin. Anemia Post Op  Repeat CBC and BMP Restless Leg On Klonopin Constipation On Miralax Hypokalemia On Potassium Repeat BMP Family/ staff Communication:   Labs/tests ordered: Total time spent in this patient care encounter was 45_ minutes; greater than 50% of the visit spent counseling patient, reviewing records , Labs and coordinating care for problems addressed at this encounter.

## 2018-05-17 DIAGNOSIS — G2581 Restless legs syndrome: Secondary | ICD-10-CM | POA: Insufficient documentation

## 2018-05-18 ENCOUNTER — Non-Acute Institutional Stay (SKILLED_NURSING_FACILITY): Payer: Medicare Other | Admitting: Adult Health

## 2018-05-18 ENCOUNTER — Encounter: Payer: Self-pay | Admitting: Adult Health

## 2018-05-18 ENCOUNTER — Other Ambulatory Visit: Payer: Self-pay | Admitting: Adult Health

## 2018-05-18 DIAGNOSIS — M1712 Unilateral primary osteoarthritis, left knee: Secondary | ICD-10-CM

## 2018-05-18 DIAGNOSIS — I10 Essential (primary) hypertension: Secondary | ICD-10-CM

## 2018-05-18 DIAGNOSIS — M545 Low back pain: Principal | ICD-10-CM

## 2018-05-18 DIAGNOSIS — G8929 Other chronic pain: Secondary | ICD-10-CM

## 2018-05-18 DIAGNOSIS — Z96652 Presence of left artificial knee joint: Secondary | ICD-10-CM | POA: Diagnosis not present

## 2018-05-18 MED ORDER — IPRATROPIUM BROMIDE 0.02 % IN SOLN: 0.5000 mg | Freq: Four times a day (QID) | RESPIRATORY_TRACT | 0 refills | Status: DC | PRN

## 2018-05-18 MED ORDER — POTASSIUM CHLORIDE CRYS ER 20 MEQ PO TBCR: 20.0000 meq | EXTENDED_RELEASE_TABLET | Freq: Three times a day (TID) | ORAL | 0 refills | Status: DC

## 2018-05-18 MED ORDER — PANTOPRAZOLE SODIUM 40 MG PO TBEC: 40.0000 mg | DELAYED_RELEASE_TABLET | Freq: Every day | ORAL | 0 refills | Status: AC

## 2018-05-18 MED ORDER — HYDROCHLOROTHIAZIDE 25 MG PO TABS: 25.0000 mg | ORAL_TABLET | Freq: Every day | ORAL | 0 refills | Status: AC

## 2018-05-18 MED ORDER — METFORMIN HCL ER 500 MG PO TB24: 500.0000 mg | ORAL_TABLET | Freq: Every day | ORAL | 0 refills | Status: AC

## 2018-05-18 MED ORDER — METHOCARBAMOL 500 MG PO TABS: 500.0000 mg | ORAL_TABLET | Freq: Four times a day (QID) | ORAL | 0 refills | Status: DC | PRN

## 2018-05-18 MED ORDER — HYDROCODONE-ACETAMINOPHEN 5-325 MG PO TABS: 1.0000 | ORAL_TABLET | ORAL | 0 refills | Status: DC | PRN

## 2018-05-18 MED ORDER — GABAPENTIN 300 MG PO CAPS: 300.0000 mg | ORAL_CAPSULE | Freq: Every day | ORAL | 0 refills | Status: DC

## 2018-05-18 MED ORDER — SIMVASTATIN 20 MG PO TABS: 20.0000 mg | ORAL_TABLET | Freq: Every day | ORAL | 0 refills | Status: AC

## 2018-05-18 MED ORDER — CLONAZEPAM 0.5 MG PO TABS: 0.2500 mg | ORAL_TABLET | Freq: Every evening | ORAL | 0 refills | Status: AC | PRN

## 2018-05-18 MED ORDER — LOSARTAN POTASSIUM 100 MG PO TABS: 100.0000 mg | ORAL_TABLET | Freq: Every day | ORAL | 0 refills | Status: DC

## 2018-05-18 MED ORDER — AMLODIPINE BESYLATE 5 MG PO TABS: 5.0000 mg | ORAL_TABLET | Freq: Every day | ORAL | 0 refills | Status: DC

## 2018-05-18 NOTE — Progress Notes (Signed)
Location:   [facility] Room Number: 143 P Place of Service:  SNF (31)    CODE STATUS: Full Code  No Known Allergies  Chief Complaint  Patient presents with  . Discharge Note    Discharging to home on 05/20/2018    HPI:  She is being discharged to home with home health for pt/ot. She does not require any dme. She will need her prescriptions filled and has a follow up with orthopedics on 05-25-18.  She was hospitalized for a left knee replacement. She was admitted to this facility for short term rehab; she has completed SNF rehab.   Past Medical History:  Diagnosis Date  . Allergic rhinitis   . Arthritis   . Common bile duct dilation 05/22/2016  . Diverticulosis   . DM (diabetes mellitus) (HCC)   . Essential hypertension 05/22/2016  . GERD (gastroesophageal reflux disease)   . High cholesterol 05/22/2016  . Hyperlipidemia   . Hypertension   . Restless leg syndrome     Past Surgical History:  Procedure Laterality Date  . ANTERIOR AND POSTERIOR REPAIR N/A 12/29/2016   Procedure: ANTERIOR (CYSTOCELE) AND POSTERIOR REPAIR (RECTOCELE);  Surgeon: Tilda Burrow, MD;  Location: [facility];  Service: Gynecology;  Laterality: N/A;  . CHOLECYSTECTOMY    . complete hysterectomy    . ERCP W/ SPHICTEROTOMY  1974  . REPLACEMENT TOTAL KNEE    . TOTAL KNEE ARTHROPLASTY Left 05/10/2018   Procedure: TOTAL KNEE ARTHROPLASTY;  Surgeon: Vickki Hearing, MD;  Location: [facility];  Service: Orthopedics;  Laterality: Left;    Social History   Socioeconomic History  . Marital status: Married    Spouse name: Not on file  . Number of children: Not on file  . Years of education: Not on file  . Highest education level: Not on file  Occupational History  . Not on file  Social Needs  . Financial resource strain: Not on file  . Food insecurity:    Worry: Not on file    Inability: Not on file  . Transportation needs:    Medical: Not on file    Non-medical: Not on  file  Tobacco Use  . Smoking status: Never Smoker  . Smokeless tobacco: Never Used  Substance and Sexual Activity  . Alcohol use: No  . Drug use: No  . Sexual activity: Never    Birth control/protection: Surgical    Comment: hyst  Lifestyle  . Physical activity:    Days per week: Not on file    Minutes per session: Not on file  . Stress: Not on file  Relationships  . Social connections:    Talks on phone: Not on file    Gets together: Not on file    Attends religious service: Not on file    Active member of club or organization: Not on file    Attends meetings of clubs or organizations: Not on file    Relationship status: Not on file  . Intimate partner violence:    Fear of current or ex partner: Not on file    Emotionally abused: Not on file    Physically abused: Not on file    Forced sexual activity: Not on file  Other Topics Concern  . Not on file  Social History Narrative  . Not on file   Family History  Problem Relation Age of Onset  . Diabetes Sister   . Emphysema Father   . Dementia Mother   .  Hypertension Mother   . COPD Brother   . Heart attack Brother   . Breast cancer Sister   . Diabetes Son     VITAL SIGNS BP (!) 104/56   Pulse 79   Temp 99.1 F (37.3 C)   Resp 16   Ht 5\' 1"  (1.549 m)   Wt 194 lb 9.6 oz (88.3 kg)   SpO2 96%   BMI 36.77 kg/m   Patient's Medications  New Prescriptions   No medications on file  Previous Medications   AMLODIPINE (NORVASC) 5 MG TABLET    Take 1 tablet (5 mg total) by mouth daily.   ASPIRIN EC 325 MG EC TABLET    Take 1 tablet (325 mg total) by mouth daily with breakfast.   CALCIUM CARBONATE (TUMS - DOSED IN MG ELEMENTAL CALCIUM) 500 MG CHEWABLE TABLET    Chew 1 tablet by mouth 3 (three) times daily as needed for indigestion or heartburn.   CLONAZEPAM (KLONOPIN) 0.5 MG TABLET    Take 0.5 tablets (0.25 mg total) by mouth at bedtime as needed for anxiety.   DOCUSATE SODIUM (COLACE) 100 MG CAPSULE    Take 1 capsule  (100 mg total) by mouth 2 (two) times daily.   GABAPENTIN (NEURONTIN) 300 MG CAPSULE    Take 1 capsule (300 mg total) by mouth at bedtime.   GUAIFENESIN (MUCINEX) 600 MG 12 HR TABLET    Take 600 mg by mouth 2 (two) times daily.   HYDROCHLOROTHIAZIDE (HYDRODIURIL) 25 MG TABLET    Take 1 tablet (25 mg total) by mouth daily.   HYDROCODONE-ACETAMINOPHEN (NORCO/VICODIN) 5-325 MG TABLET    Take 1 tablet by mouth every 4 (four) hours as needed for moderate pain.   IPRATROPIUM (ATROVENT) 0.02 % NEBULIZER SOLUTION    Take 2.5 mLs (0.5 mg total) by nebulization every 6 (six) hours as needed for wheezing or shortness of breath. Inhale 1 vial every 6 hours as needed for wheezing   LOSARTAN (COZAAR) 100 MG TABLET    Take 1 tablet (100 mg total) by mouth daily.   METFORMIN (GLUCOPHAGE-XR) 500 MG 24 HR TABLET    Take 1 tablet (500 mg total) by mouth at bedtime.   METHOCARBAMOL (ROBAXIN) 500 MG TABLET    Take 1 tablet (500 mg total) by mouth every 6 (six) hours as needed for muscle spasms.   NON FORMULARY    Diet Type:  NAS   PANTOPRAZOLE (PROTONIX) 40 MG TABLET    Take 1 tablet (40 mg total) by mouth daily.   POTASSIUM CHLORIDE SA (K-DUR,KLOR-CON) 20 MEQ TABLET    Take 1 tablet (20 mEq total) by mouth 3 (three) times daily.   SIMVASTATIN (ZOCOR) 20 MG TABLET    Take 1 tablet (20 mg total) by mouth at bedtime.  Modified Medications   No medications on file  Discontinued Medications   No medications on file     SIGNIFICANT DIAGNOSTIC EXAMS  LABS REVIEWED TODAY:   05-11-18: wbc 9.9; hgb 9.8; hct 30.6; mcv 99.0 plt 216; glucose 112; bun 16; creat 0.81; k+ 3.0; na++ 139; ca 8.3  Review of Systems  Constitutional: Negative for malaise/fatigue.  Respiratory: Negative for cough and shortness of breath.   Cardiovascular: Negative for chest pain, palpitations and leg swelling.  Gastrointestinal: Negative for abdominal pain, constipation and heartburn.  Musculoskeletal: Negative for back pain, joint pain and  myalgias.  Skin: Negative.   Neurological: Negative for dizziness.  Psychiatric/Behavioral: The patient is not nervous/anxious.  Physical Exam Constitutional:      General: She is not in acute distress.    Appearance: She is well-developed. She is not diaphoretic.  Neck:     Musculoskeletal: Neck supple.     Thyroid: No thyromegaly.  Cardiovascular:     Rate and Rhythm: Normal rate and regular rhythm.     Pulses: Normal pulses.     Heart sounds: Normal heart sounds.  Pulmonary:     Effort: Pulmonary effort is normal. No respiratory distress.     Breath sounds: Normal breath sounds.  Abdominal:     General: Bowel sounds are normal. There is no distension.     Palpations: Abdomen is soft.     Tenderness: There is no abdominal tenderness.  Musculoskeletal:     Right lower leg: No edema.     Left lower leg: No edema.     Comments: Is able to move all extremities Is status post left knee replacement   Lymphadenopathy:     Cervical: No cervical adenopathy.  Skin:    General: Skin is warm and dry.     Comments: Left knee incision line without signs of infection present   Neurological:     Mental Status: She is alert and oriented to person, place, and time.  Psychiatric:        Mood and Affect: Mood normal.        ASSESSMENT/ PLAN:   Patient is being discharged with the following home health services:  Pt/ot to evaluate and treat as indicated for gait balance strength adl training  Patient is being discharged with the following durable medical equipment:  None needed   Patient has been advised to f/u with their PCP in 1-2 weeks to bring them up to date on their rehab stay.  Social services at facility was responsible for arranging this appointment.  Pt was provided with a 30 day supply of prescriptions for medications and refills must be obtained from their PCP.  For controlled substances, a more limited supply may be provided adequate until PCP appointment only.  A 30  day supply of [facility] [place] [phone](#[id number]): with #5 klonopin 0.5 mg tabs and #15 vicodin 5.325 mg tabs  Time spent with patient: 35 minutes: discussed home health needs; dme needs and medications; verbalized understanding.    Synthia Innocenteborah  NP [facility]  Contact [phone] Monday through Friday 8am- 5pm  After hours call [phone]

## 2018-05-19 ENCOUNTER — Other Ambulatory Visit: Payer: Self-pay | Admitting: Adult Health

## 2018-05-19 ENCOUNTER — Encounter (HOSPITAL_COMMUNITY)
Admission: RE | Admit: 2018-05-19 | Discharge: 2018-05-19 | Disposition: A | Discharge status: Home / Self Care | Payer: Medicare Other | Source: Skilled Nursing Facility | Attending: Internal Medicine | Admitting: Internal Medicine

## 2018-05-19 DIAGNOSIS — I10 Essential (primary) hypertension: Secondary | ICD-10-CM | POA: Insufficient documentation

## 2018-05-19 DIAGNOSIS — Z471 Aftercare following joint replacement surgery: Secondary | ICD-10-CM | POA: Insufficient documentation

## 2018-05-19 DIAGNOSIS — Z6836 Body mass index (BMI) 36.0-36.9, adult: Secondary | ICD-10-CM | POA: Insufficient documentation

## 2018-05-19 DIAGNOSIS — E119 Type 2 diabetes mellitus without complications: Secondary | ICD-10-CM | POA: Insufficient documentation

## 2018-05-19 DIAGNOSIS — Z96653 Presence of artificial knee joint, bilateral: Secondary | ICD-10-CM | POA: Insufficient documentation

## 2018-05-19 LAB — BASIC METABOLIC PANEL
Anion gap: 9 (ref 5–15)
BUN: 14 mg/dL (ref 8–23)
CO2: 26 mmol/L (ref 22–32)
Calcium: 9.1 mg/dL (ref 8.9–10.3)
Chloride: 103 mmol/L (ref 98–111)
Creatinine, Ser: 0.7 mg/dL (ref 0.44–1.00)
GFR calc Af Amer: >60 mL/min (ref >60)
GFR calc non Af Amer: >60 mL/min (ref >60)
Glucose, Bld: 99 mg/dL (ref 70–99)
Potassium: 3.9 mmol/L (ref 3.5–5.1)
Sodium: 138 mmol/L (ref 135–145)

## 2018-05-19 LAB — CBC
HCT: 32.2 % — ABNORMAL LOW (ref 36.0–46.0)
Hemoglobin: 10.3 g/dL — ABNORMAL LOW (ref 12.0–15.0)
MCH: 31.9 pg (ref 26.0–34.0)
MCHC: 32 g/dL (ref 30.0–36.0)
MCV: 99.7 fL (ref 80.0–100.0)
Platelets: 410 10*3/uL — ABNORMAL HIGH (ref 150–400)
RBC: 3.23 MIL/uL — ABNORMAL LOW (ref 3.87–5.11)
RDW: 13.2 % (ref 11.5–15.5)
WBC: 11.6 10*3/uL — ABNORMAL HIGH (ref 4.0–10.5)
nRBC: 0 % (ref 0.0–0.2)

## 2018-05-19 MED ORDER — HYDROCODONE-ACETAMINOPHEN 5-325 MG PO TABS: 1.0000 | ORAL_TABLET | ORAL | 0 refills | Status: DC | PRN

## 2018-05-20 LAB — TYPE AND SCREEN
ABO/RH(D): A POS
Antibody Screen: NEGATIVE
Unit division: 0
Unit division: 0

## 2018-05-20 LAB — BPAM RBC
BLOOD PRODUCT EXPIRATION DATE: 202003022359
Blood Product Expiration Date: 202003082359
Unit Type and Rh: 6200
Unit Type and Rh: 6200

## 2018-05-23 ENCOUNTER — Telehealth: Payer: Self-pay | Admitting: Orthopedic Surgery

## 2018-05-23 NOTE — Telephone Encounter (Signed)
Gloria Sanchez with [facility] wanted to let Dr. Romeo Apple know that they will be going out tomorrow 05/24/18 to see Gloria Sanchez for [facility].

## 2018-05-25 ENCOUNTER — Encounter: Payer: Self-pay | Admitting: Orthopedic Surgery

## 2018-05-25 ENCOUNTER — Ambulatory Visit (INDEPENDENT_AMBULATORY_CARE_PROVIDER_SITE_OTHER): Payer: Medicare Other | Admitting: Orthopedic Surgery

## 2018-05-25 VITALS — BP 115/65 | HR 70 | Ht 61.0 in | Wt 193.0 lb

## 2018-05-25 DIAGNOSIS — Z9889 Other specified postprocedural states: Secondary | ICD-10-CM

## 2018-05-25 DIAGNOSIS — R112 Nausea with vomiting, unspecified: Secondary | ICD-10-CM

## 2018-05-25 DIAGNOSIS — Z96652 Presence of left artificial knee joint: Secondary | ICD-10-CM

## 2018-05-25 MED ORDER — ONDANSETRON HCL 4 MG PO TABS: 4.0000 mg | ORAL_TABLET | Freq: Three times a day (TID) | ORAL | 0 refills | Status: DC | PRN

## 2018-05-25 NOTE — Telephone Encounter (Signed)
I called to give verbal

## 2018-05-25 NOTE — Progress Notes (Signed)
POSTOP VISIT  POD # 15  Chief Complaint  Patient presents with  . Post-op Follow-up    left total knee replacement 05/10/18    78 year old female 2 weeks after left total knee doing well except for complains of nausea and she developed a cough when she left the nursing home.  She is scheduled see primary care regarding the cough within the next day or 2  Physical therapy is going well she is using a walker she has greater than 100 degrees of knee flexion full extension  Her wound looks clean calf is soft and supple.   Encounter Diagnoses  Name Primary?  . S/P total knee replacement, left 05/10/2018 Yes  . Post-operative nausea and vomiting     RX: ZOFRAN   Postoperative plan (Work, [employer],  Meds ordered this encounter  Medications  . ondansetron (ZOFRAN) 4 MG tablet    Sig: Take 1 tablet (4 mg total) by mouth every 8 (eight) hours as needed for nausea or vomiting.    Dispense:  20 tablet    Refill:  0  ,FU)  3 WEEK S

## 2018-05-25 NOTE — Telephone Encounter (Signed)
Call from [facility] physical therapist Joey, requesting verbal orders for plan of care, 2 times per week for 1 week and 3 times per week for 1 week. Phone# [phone].

## 2018-05-26 ENCOUNTER — Telehealth: Payer: Self-pay | Admitting: Radiology

## 2018-05-26 DIAGNOSIS — J0101 Acute recurrent maxillary sinusitis: Secondary | ICD-10-CM | POA: Insufficient documentation

## 2018-05-26 DIAGNOSIS — J069 Acute upper respiratory infection, unspecified: Secondary | ICD-10-CM | POA: Insufficient documentation

## 2018-05-26 NOTE — Telephone Encounter (Signed)
Call from [facility] occupational therapist, [facility], requesting verbal orders for 1 additional visit; call routed to [facility]

## 2018-05-26 NOTE — Telephone Encounter (Signed)
Spoke to OT today, to give verbal okay for her to have another visit for ADL's

## 2018-06-03 ENCOUNTER — Telehealth: Payer: Self-pay | Admitting: Orthopedic Surgery

## 2018-06-03 DIAGNOSIS — Z96652 Presence of left artificial knee joint: Secondary | ICD-10-CM

## 2018-06-03 NOTE — Telephone Encounter (Signed)
Patient called to relay that she has just been discharged from home therapy; states therapist advised her to call for Dr Romeo Apple to order out-patient therapy. States would like to have it at [facility] [place] out-patient facility.

## 2018-06-06 NOTE — Telephone Encounter (Signed)
Faxed order called patient to advise. No answer

## 2018-06-11 ENCOUNTER — Other Ambulatory Visit (HOSPITAL_COMMUNITY): Payer: Self-pay | Admitting: Orthopedic Surgery

## 2018-06-11 ENCOUNTER — Other Ambulatory Visit: Payer: Self-pay | Admitting: Adult Health

## 2018-06-15 ENCOUNTER — Other Ambulatory Visit: Payer: Self-pay

## 2018-06-15 ENCOUNTER — Ambulatory Visit (INDEPENDENT_AMBULATORY_CARE_PROVIDER_SITE_OTHER): Payer: Medicare Other | Admitting: Orthopedic Surgery

## 2018-06-15 ENCOUNTER — Encounter: Payer: Self-pay | Admitting: Orthopedic Surgery

## 2018-06-15 VITALS — BP 139/84 | HR 86 | Ht 61.0 in | Wt 193.0 lb

## 2018-06-15 DIAGNOSIS — Z96652 Presence of left artificial knee joint: Secondary | ICD-10-CM

## 2018-06-15 MED ORDER — HYDROCODONE-ACETAMINOPHEN 5-325 MG PO TABS: 1.0000 | ORAL_TABLET | Freq: Four times a day (QID) | ORAL | 0 refills | Status: DC | PRN

## 2018-06-15 NOTE — Progress Notes (Signed)
Chief Complaint  Patient presents with  . Post-op Follow-up    05/10/18 left total knee replacement   . Medication Refill    needs RF of Hydrocodone     Postop left total knee doing well 0-1 20 range of motion walking with a walker having a lot of pain at night early at 4 in the morning  Her wound looks clean dry and intact her active range of motion is 0-1 20  Follow-up in 4 weeks  Meds ordered this encounter  Medications  . HYDROcodone-acetaminophen (NORCO/VICODIN) 5-325 MG tablet    Sig: Take 1 tablet by mouth every 6 (six) hours as needed for moderate pain.    Dispense:  28 tablet    Refill:  0

## 2018-07-01 ENCOUNTER — Other Ambulatory Visit: Payer: Self-pay | Admitting: Orthopedic Surgery

## 2018-07-01 DIAGNOSIS — Z96652 Presence of left artificial knee joint: Secondary | ICD-10-CM

## 2018-07-01 MED ORDER — HYDROCODONE-ACETAMINOPHEN 5-325 MG PO TABS: 1.0000 | ORAL_TABLET | Freq: Four times a day (QID) | ORAL | 0 refills | Status: DC | PRN

## 2018-07-01 NOTE — Telephone Encounter (Signed)
Patient called and requested a refill on Hydrocodone/Acetaminophen 5-325  Mgs.  Qty  28  Sig: Take 1 tablet by mouth every 6 (six) hours as needed for moderate pain.  [facility] [place]

## 2018-07-08 ENCOUNTER — Other Ambulatory Visit: Payer: Self-pay | Admitting: Adult Health

## 2018-07-13 ENCOUNTER — Other Ambulatory Visit: Payer: Self-pay

## 2018-07-13 ENCOUNTER — Ambulatory Visit (INDEPENDENT_AMBULATORY_CARE_PROVIDER_SITE_OTHER): Payer: Medicare Other | Admitting: Orthopedic Surgery

## 2018-07-13 ENCOUNTER — Encounter: Payer: Self-pay | Admitting: Orthopedic Surgery

## 2018-07-13 VITALS — Temp 98.3°F | Ht 61.0 in | Wt 193.0 lb

## 2018-07-13 DIAGNOSIS — Z96652 Presence of left artificial knee joint: Secondary | ICD-10-CM

## 2018-07-13 MED ORDER — HYDROCODONE-ACETAMINOPHEN 5-325 MG PO TABS: 1.0000 | ORAL_TABLET | Freq: Four times a day (QID) | ORAL | 0 refills | Status: DC | PRN

## 2018-07-13 MED ORDER — METHOCARBAMOL 500 MG PO TABS: 500.0000 mg | ORAL_TABLET | Freq: Four times a day (QID) | ORAL | 0 refills | Status: DC | PRN

## 2018-07-13 NOTE — Progress Notes (Signed)
POSTOP VISIT  POD # 64// 9 weeks   Chief Complaint  Patient presents with  . Routine Post Op    left knee replacement 05/10/18  . Medication Refill    Hydrocodone and Methocarbamol     33 female status post left knee replacement doing well complains of some heel pain and some lateral knee discomfort at 4:00 in the morning  She is using a cane now  Her range of motion is 0- 120 degrees   Encounter Diagnoses  Name Primary?  . S/P total knee replacement, left 05/10/18 Yes  . S/P total knee replacement, left 05/10/2018       Postoperative plan (Work, [employer],  Meds ordered this encounter  Medications  . methocarbamol (ROBAXIN) 500 MG tablet    Sig: Take 1 tablet (500 mg total) by mouth every 6 (six) hours as needed for muscle spasms.    Dispense:  120 tablet    Refill:  0  . HYDROcodone-acetaminophen (NORCO/VICODIN) 5-325 MG tablet    Sig: Take 1 tablet by mouth every 6 (six) hours as needed for moderate pain.    Dispense:  28 tablet    Refill:  0  ,FU)  Follow-up in 6 months

## 2018-07-30 ENCOUNTER — Other Ambulatory Visit: Payer: Self-pay | Admitting: Adult Health

## 2018-08-27 ENCOUNTER — Other Ambulatory Visit: Payer: Self-pay | Admitting: Adult Health

## 2018-09-17 ENCOUNTER — Other Ambulatory Visit: Payer: Self-pay | Admitting: Orthopedic Surgery

## 2018-10-08 ENCOUNTER — Other Ambulatory Visit: Payer: Self-pay | Admitting: Orthopedic Surgery

## 2018-10-23 ENCOUNTER — Other Ambulatory Visit: Payer: Self-pay | Admitting: Adult Health

## 2018-10-23 DIAGNOSIS — G8929 Other chronic pain: Secondary | ICD-10-CM

## 2018-10-23 DIAGNOSIS — M545 Low back pain, unspecified: Secondary | ICD-10-CM

## 2018-12-18 ENCOUNTER — Other Ambulatory Visit: Payer: Self-pay | Admitting: Orthopedic Surgery

## 2018-12-18 DIAGNOSIS — Z96652 Presence of left artificial knee joint: Secondary | ICD-10-CM

## 2019-01-16 ENCOUNTER — Other Ambulatory Visit: Payer: Self-pay

## 2019-01-16 ENCOUNTER — Ambulatory Visit: Payer: Medicare Other | Admitting: Orthopedic Surgery

## 2019-01-16 VITALS — BP 160/82 | HR 74 | Ht 61.0 in | Wt 198.0 lb

## 2019-01-16 DIAGNOSIS — M1712 Unilateral primary osteoarthritis, left knee: Secondary | ICD-10-CM | POA: Diagnosis not present

## 2019-01-16 DIAGNOSIS — Z96652 Presence of left artificial knee joint: Secondary | ICD-10-CM | POA: Diagnosis not present

## 2019-01-16 NOTE — Progress Notes (Signed)
Chief Complaint  Patient presents with  . Follow-up    Recheck on left knee replacement, DOS 05-10-18.    Recheck 6 months total knee doing well except she is having some back discomfort already seen primary care who put her on prednisone Dosepak currently on ibuprofen and Robaxin.  Offered therapy but declined as she does not have a ride  Her knee incision looks clean she is walking with a cane in her right hand she has full flexion of the knee it feels stable  Follow-up in February for annual x-ray

## 2019-02-27 ENCOUNTER — Telehealth: Payer: Self-pay | Admitting: Orthopedic Surgery

## 2019-02-27 NOTE — Telephone Encounter (Signed)
Patient called to inquire about seeing Dr Aline Brochure as quickly as possible for her back and leg pain - thinks may be sciatica. Aware all checked out well at time of her visit here in October for her total knee replacement follow up. Relayed that we do not have any immediate appointment available; therefore, primary care provider is recommendation. If primary care requests referral, patient will call back to let us know; aware Dr Aline Brochure will review.

## 2019-04-04 DIAGNOSIS — M9903 Segmental and somatic dysfunction of lumbar region: Secondary | ICD-10-CM | POA: Diagnosis not present

## 2019-04-04 DIAGNOSIS — M47816 Spondylosis without myelopathy or radiculopathy, lumbar region: Secondary | ICD-10-CM | POA: Diagnosis not present

## 2019-04-06 DIAGNOSIS — M9903 Segmental and somatic dysfunction of lumbar region: Secondary | ICD-10-CM | POA: Diagnosis not present

## 2019-04-06 DIAGNOSIS — M47816 Spondylosis without myelopathy or radiculopathy, lumbar region: Secondary | ICD-10-CM | POA: Diagnosis not present

## 2019-04-10 DIAGNOSIS — M47816 Spondylosis without myelopathy or radiculopathy, lumbar region: Secondary | ICD-10-CM | POA: Diagnosis not present

## 2019-04-10 DIAGNOSIS — M9903 Segmental and somatic dysfunction of lumbar region: Secondary | ICD-10-CM | POA: Diagnosis not present

## 2019-04-12 DIAGNOSIS — M9903 Segmental and somatic dysfunction of lumbar region: Secondary | ICD-10-CM | POA: Diagnosis not present

## 2019-04-12 DIAGNOSIS — M47816 Spondylosis without myelopathy or radiculopathy, lumbar region: Secondary | ICD-10-CM | POA: Diagnosis not present

## 2019-04-18 DIAGNOSIS — M9903 Segmental and somatic dysfunction of lumbar region: Secondary | ICD-10-CM | POA: Diagnosis not present

## 2019-04-18 DIAGNOSIS — M47816 Spondylosis without myelopathy or radiculopathy, lumbar region: Secondary | ICD-10-CM | POA: Diagnosis not present

## 2019-05-02 DIAGNOSIS — M47816 Spondylosis without myelopathy or radiculopathy, lumbar region: Secondary | ICD-10-CM | POA: Diagnosis not present

## 2019-05-02 DIAGNOSIS — M9903 Segmental and somatic dysfunction of lumbar region: Secondary | ICD-10-CM | POA: Diagnosis not present

## 2019-05-04 DIAGNOSIS — M9903 Segmental and somatic dysfunction of lumbar region: Secondary | ICD-10-CM | POA: Diagnosis not present

## 2019-05-04 DIAGNOSIS — M47816 Spondylosis without myelopathy or radiculopathy, lumbar region: Secondary | ICD-10-CM | POA: Diagnosis not present

## 2019-05-08 DIAGNOSIS — M47816 Spondylosis without myelopathy or radiculopathy, lumbar region: Secondary | ICD-10-CM | POA: Diagnosis not present

## 2019-05-08 DIAGNOSIS — M9903 Segmental and somatic dysfunction of lumbar region: Secondary | ICD-10-CM | POA: Diagnosis not present

## 2019-05-15 ENCOUNTER — Telehealth: Payer: Self-pay | Admitting: Orthopedic Surgery

## 2019-05-15 ENCOUNTER — Other Ambulatory Visit: Payer: Self-pay | Admitting: Orthopedic Surgery

## 2019-05-15 DIAGNOSIS — Z96652 Presence of left artificial knee joint: Secondary | ICD-10-CM

## 2019-05-15 DIAGNOSIS — M9903 Segmental and somatic dysfunction of lumbar region: Secondary | ICD-10-CM | POA: Diagnosis not present

## 2019-05-15 DIAGNOSIS — M47816 Spondylosis without myelopathy or radiculopathy, lumbar region: Secondary | ICD-10-CM | POA: Diagnosis not present

## 2019-05-15 MED ORDER — METHOCARBAMOL 500 MG PO TABS: 500.0000 mg | ORAL_TABLET | Freq: Four times a day (QID) | ORAL | 0 refills | Status: DC | PRN

## 2019-05-15 NOTE — Telephone Encounter (Signed)
Patient called to ask if she may have a refill on medication due to leg/knee hurting more in the cold weather; I also offered sooner date of appointment. methocarbamol (ROBAXIN) 500 MG tablet   [facility] [place], [place]

## 2019-05-22 DIAGNOSIS — M47816 Spondylosis without myelopathy or radiculopathy, lumbar region: Secondary | ICD-10-CM | POA: Diagnosis not present

## 2019-05-22 DIAGNOSIS — M9903 Segmental and somatic dysfunction of lumbar region: Secondary | ICD-10-CM | POA: Diagnosis not present

## 2019-05-30 DIAGNOSIS — M47816 Spondylosis without myelopathy or radiculopathy, lumbar region: Secondary | ICD-10-CM | POA: Diagnosis not present

## 2019-05-30 DIAGNOSIS — M9903 Segmental and somatic dysfunction of lumbar region: Secondary | ICD-10-CM | POA: Diagnosis not present

## 2019-05-31 DIAGNOSIS — E119 Type 2 diabetes mellitus without complications: Secondary | ICD-10-CM | POA: Diagnosis not present

## 2019-05-31 DIAGNOSIS — I10 Essential (primary) hypertension: Secondary | ICD-10-CM | POA: Diagnosis not present

## 2019-05-31 DIAGNOSIS — K2100 Gastro-esophageal reflux disease with esophagitis, without bleeding: Secondary | ICD-10-CM | POA: Diagnosis not present

## 2019-05-31 DIAGNOSIS — E7800 Pure hypercholesterolemia, unspecified: Secondary | ICD-10-CM | POA: Diagnosis not present

## 2019-05-31 DIAGNOSIS — E782 Mixed hyperlipidemia: Secondary | ICD-10-CM | POA: Diagnosis not present

## 2019-06-07 DIAGNOSIS — Z6835 Body mass index (BMI) 35.0-35.9, adult: Secondary | ICD-10-CM | POA: Diagnosis not present

## 2019-06-07 DIAGNOSIS — M1712 Unilateral primary osteoarthritis, left knee: Secondary | ICD-10-CM | POA: Diagnosis not present

## 2019-06-07 DIAGNOSIS — I10 Essential (primary) hypertension: Secondary | ICD-10-CM | POA: Diagnosis not present

## 2019-06-07 DIAGNOSIS — E782 Mixed hyperlipidemia: Secondary | ICD-10-CM | POA: Diagnosis not present

## 2019-06-07 DIAGNOSIS — G2589 Other specified extrapyramidal and movement disorders: Secondary | ICD-10-CM | POA: Diagnosis not present

## 2019-06-07 DIAGNOSIS — E119 Type 2 diabetes mellitus without complications: Secondary | ICD-10-CM | POA: Diagnosis not present

## 2019-06-07 DIAGNOSIS — N8182 Incompetence or weakening of pubocervical tissue: Secondary | ICD-10-CM | POA: Diagnosis not present

## 2019-06-14 DIAGNOSIS — M47816 Spondylosis without myelopathy or radiculopathy, lumbar region: Secondary | ICD-10-CM | POA: Diagnosis not present

## 2019-06-14 DIAGNOSIS — M9903 Segmental and somatic dysfunction of lumbar region: Secondary | ICD-10-CM | POA: Diagnosis not present

## 2019-06-19 DIAGNOSIS — M9903 Segmental and somatic dysfunction of lumbar region: Secondary | ICD-10-CM | POA: Diagnosis not present

## 2019-06-19 DIAGNOSIS — M47816 Spondylosis without myelopathy or radiculopathy, lumbar region: Secondary | ICD-10-CM | POA: Diagnosis not present

## 2019-06-26 DIAGNOSIS — M47816 Spondylosis without myelopathy or radiculopathy, lumbar region: Secondary | ICD-10-CM | POA: Diagnosis not present

## 2019-06-26 DIAGNOSIS — M9903 Segmental and somatic dysfunction of lumbar region: Secondary | ICD-10-CM | POA: Diagnosis not present

## 2019-07-03 DIAGNOSIS — M47816 Spondylosis without myelopathy or radiculopathy, lumbar region: Secondary | ICD-10-CM | POA: Diagnosis not present

## 2019-07-03 DIAGNOSIS — M9903 Segmental and somatic dysfunction of lumbar region: Secondary | ICD-10-CM | POA: Diagnosis not present

## 2019-07-10 DIAGNOSIS — M47816 Spondylosis without myelopathy or radiculopathy, lumbar region: Secondary | ICD-10-CM | POA: Diagnosis not present

## 2019-07-10 DIAGNOSIS — M9903 Segmental and somatic dysfunction of lumbar region: Secondary | ICD-10-CM | POA: Diagnosis not present

## 2019-07-17 ENCOUNTER — Other Ambulatory Visit: Payer: Self-pay

## 2019-07-17 ENCOUNTER — Ambulatory Visit: Payer: Medicare PPO

## 2019-07-17 ENCOUNTER — Encounter: Payer: Self-pay | Admitting: Orthopedic Surgery

## 2019-07-17 ENCOUNTER — Ambulatory Visit: Payer: Medicare PPO | Admitting: Orthopedic Surgery

## 2019-07-17 VITALS — Temp 97.7°F | Ht 61.0 in | Wt 194.0 lb

## 2019-07-17 DIAGNOSIS — M545 Low back pain, unspecified: Secondary | ICD-10-CM

## 2019-07-17 DIAGNOSIS — M1710 Unilateral primary osteoarthritis, unspecified knee: Secondary | ICD-10-CM

## 2019-07-17 DIAGNOSIS — M5441 Lumbago with sciatica, right side: Secondary | ICD-10-CM | POA: Diagnosis not present

## 2019-07-17 DIAGNOSIS — Z96652 Presence of left artificial knee joint: Secondary | ICD-10-CM

## 2019-07-17 DIAGNOSIS — G8929 Other chronic pain: Secondary | ICD-10-CM | POA: Diagnosis not present

## 2019-07-17 DIAGNOSIS — M1712 Unilateral primary osteoarthritis, left knee: Secondary | ICD-10-CM | POA: Diagnosis not present

## 2019-07-17 MED ORDER — GABAPENTIN 300 MG PO CAPS: 300.0000 mg | ORAL_CAPSULE | Freq: Three times a day (TID) | ORAL | 1 refills | Status: AC

## 2019-07-17 MED ORDER — METHOCARBAMOL 500 MG PO TABS: 500.0000 mg | ORAL_TABLET | Freq: Four times a day (QID) | ORAL | 0 refills | Status: DC | PRN

## 2019-07-17 MED ORDER — HYDROCODONE-ACETAMINOPHEN 5-325 MG PO TABS: 1.0000 | ORAL_TABLET | Freq: Four times a day (QID) | ORAL | 0 refills | Status: AC | PRN

## 2019-07-17 NOTE — Progress Notes (Signed)
Chief Complaint  Patient presents with  . Follow-up    Recheck on left knee, DOS 05-10-18.    Encounter Diagnoses  Name Primary?  . S/P total knee replacement, left 05/10/18 Yes  . Arthritis of knee   . Chronic right-sided low back pain with right-sided sciatica     1 year follow-up status post left total knee arthroplasty  Gloria Sanchez had a right total knee which is doing well no complaints  She had a left total knee last year which is also doing well no complaints  She is complaining of pain in her lower back which is chronic she is seeing a chiropractor she takes 300 mg of gabapentin at night but her pain is increased she is having radiation pain down the legs she is having increasing back pain and is having difficulty walking  Past Medical History:  Diagnosis Date  . Allergic rhinitis   . Arthritis   . Common bile duct dilation 05/22/2016  . Diverticulosis   . DM (diabetes mellitus) (HCC)   . Essential hypertension 05/22/2016  . GERD (gastroesophageal reflux disease)   . High cholesterol 05/22/2016  . Hyperlipidemia   . Hypertension   . Restless leg syndrome     No red flags such as fever weight loss bowel or bladder dysfunction  Temp 97.7 F (36.5 C)   Ht 5\' 1"  (1.549 m)   Wt 194 lb (88 kg)   BMI 36.66 kg/m   Physical Exam   She is oriented x3 her mood is depressed her affect is emotional she is having a lot of pain her appearance is normal she is ambulating with a cane but leaning to one side and walking very slowly  Her left knee flexion arc is 120 degrees she has no tenderness or swelling around the left knee joint stable in the coronal and sagittal planes with good strength skin is normal  She has some bilateral edema she has tenderness and pain in her lower back she does not have a straight leg raise sign  Recommend she increase her gabapentin to 300 mg 3 times a day, refill her Robaxin which she says helps and also refill her hydrocodone for 1  week  Continue chiropractor  We inquired about therapy but she preferred chiropractic treatment at this time  Follow-up 2 years x-ray left knee  Encounter Diagnoses  Name Primary?  . S/P total knee replacement, left 05/10/18 Yes  . Arthritis of knee   . Chronic right-sided low back pain with right-sided sciatica    Meds ordered this encounter  Medications  . gabapentin (NEURONTIN) 300 MG capsule    Sig: Take 1 capsule (300 mg total) by mouth 3 (three) times daily.    Dispense:  90 capsule    Refill:  1  . HYDROcodone-acetaminophen (NORCO/VICODIN) 5-325 MG tablet    Sig: Take 1 tablet by mouth every 6 (six) hours as needed for up to 5 days for moderate pain or severe pain.    Dispense:  20 tablet    Refill:  0  . methocarbamol (ROBAXIN) 500 MG tablet    Sig: Take 1 tablet (500 mg total) by mouth every 6 (six) hours as needed for muscle spasms.    Dispense:  120 tablet    Refill:  0

## 2019-07-24 DIAGNOSIS — M9903 Segmental and somatic dysfunction of lumbar region: Secondary | ICD-10-CM | POA: Diagnosis not present

## 2019-07-24 DIAGNOSIS — M47816 Spondylosis without myelopathy or radiculopathy, lumbar region: Secondary | ICD-10-CM | POA: Diagnosis not present

## 2019-07-31 DIAGNOSIS — M47816 Spondylosis without myelopathy or radiculopathy, lumbar region: Secondary | ICD-10-CM | POA: Diagnosis not present

## 2019-07-31 DIAGNOSIS — M9903 Segmental and somatic dysfunction of lumbar region: Secondary | ICD-10-CM | POA: Diagnosis not present

## 2019-08-07 DIAGNOSIS — M47816 Spondylosis without myelopathy or radiculopathy, lumbar region: Secondary | ICD-10-CM | POA: Diagnosis not present

## 2019-08-07 DIAGNOSIS — M9903 Segmental and somatic dysfunction of lumbar region: Secondary | ICD-10-CM | POA: Diagnosis not present

## 2019-08-14 DIAGNOSIS — M9903 Segmental and somatic dysfunction of lumbar region: Secondary | ICD-10-CM | POA: Diagnosis not present

## 2019-08-14 DIAGNOSIS — M47816 Spondylosis without myelopathy or radiculopathy, lumbar region: Secondary | ICD-10-CM | POA: Diagnosis not present

## 2019-08-21 DIAGNOSIS — M9903 Segmental and somatic dysfunction of lumbar region: Secondary | ICD-10-CM | POA: Diagnosis not present

## 2019-08-21 DIAGNOSIS — M47816 Spondylosis without myelopathy or radiculopathy, lumbar region: Secondary | ICD-10-CM | POA: Diagnosis not present

## 2019-10-03 DIAGNOSIS — K2100 Gastro-esophageal reflux disease with esophagitis, without bleeding: Secondary | ICD-10-CM | POA: Diagnosis not present

## 2019-10-03 DIAGNOSIS — E119 Type 2 diabetes mellitus without complications: Secondary | ICD-10-CM | POA: Diagnosis not present

## 2019-10-03 DIAGNOSIS — E7801 Familial hypercholesterolemia: Secondary | ICD-10-CM | POA: Diagnosis not present

## 2019-10-03 DIAGNOSIS — I10 Essential (primary) hypertension: Secondary | ICD-10-CM | POA: Diagnosis not present

## 2019-10-03 DIAGNOSIS — N183 Chronic kidney disease, stage 3 unspecified: Secondary | ICD-10-CM | POA: Diagnosis not present

## 2019-10-03 DIAGNOSIS — E782 Mixed hyperlipidemia: Secondary | ICD-10-CM | POA: Diagnosis not present

## 2019-10-03 DIAGNOSIS — E7800 Pure hypercholesterolemia, unspecified: Secondary | ICD-10-CM | POA: Diagnosis not present

## 2019-10-12 DIAGNOSIS — N8182 Incompetence or weakening of pubocervical tissue: Secondary | ICD-10-CM | POA: Diagnosis not present

## 2019-10-12 DIAGNOSIS — E782 Mixed hyperlipidemia: Secondary | ICD-10-CM | POA: Diagnosis not present

## 2019-10-12 DIAGNOSIS — Z6836 Body mass index (BMI) 36.0-36.9, adult: Secondary | ICD-10-CM | POA: Diagnosis not present

## 2019-10-12 DIAGNOSIS — M1712 Unilateral primary osteoarthritis, left knee: Secondary | ICD-10-CM | POA: Diagnosis not present

## 2019-10-12 DIAGNOSIS — I10 Essential (primary) hypertension: Secondary | ICD-10-CM | POA: Diagnosis not present

## 2019-10-12 DIAGNOSIS — Z23 Encounter for immunization: Secondary | ICD-10-CM | POA: Diagnosis not present

## 2019-10-12 DIAGNOSIS — E119 Type 2 diabetes mellitus without complications: Secondary | ICD-10-CM | POA: Diagnosis not present

## 2019-11-06 DIAGNOSIS — I517 Cardiomegaly: Secondary | ICD-10-CM | POA: Diagnosis not present

## 2019-11-06 DIAGNOSIS — R0789 Other chest pain: Secondary | ICD-10-CM | POA: Insufficient documentation

## 2019-11-06 DIAGNOSIS — E785 Hyperlipidemia, unspecified: Secondary | ICD-10-CM | POA: Diagnosis not present

## 2019-11-06 DIAGNOSIS — Z20822 Contact with and (suspected) exposure to covid-19: Secondary | ICD-10-CM | POA: Diagnosis not present

## 2019-11-06 DIAGNOSIS — K30 Functional dyspepsia: Secondary | ICD-10-CM | POA: Diagnosis not present

## 2019-11-06 DIAGNOSIS — E7800 Pure hypercholesterolemia, unspecified: Secondary | ICD-10-CM | POA: Diagnosis not present

## 2019-11-06 DIAGNOSIS — E119 Type 2 diabetes mellitus without complications: Secondary | ICD-10-CM | POA: Diagnosis not present

## 2019-11-06 DIAGNOSIS — R0602 Shortness of breath: Secondary | ICD-10-CM | POA: Diagnosis not present

## 2019-11-06 DIAGNOSIS — I10 Essential (primary) hypertension: Secondary | ICD-10-CM | POA: Diagnosis not present

## 2019-11-06 DIAGNOSIS — I209 Angina pectoris, unspecified: Secondary | ICD-10-CM | POA: Diagnosis not present

## 2019-11-06 DIAGNOSIS — R079 Chest pain, unspecified: Secondary | ICD-10-CM | POA: Diagnosis not present

## 2019-11-07 DIAGNOSIS — Z7982 Long term (current) use of aspirin: Secondary | ICD-10-CM | POA: Diagnosis not present

## 2019-11-07 DIAGNOSIS — E785 Hyperlipidemia, unspecified: Secondary | ICD-10-CM | POA: Diagnosis not present

## 2019-11-07 DIAGNOSIS — I208 Other forms of angina pectoris: Secondary | ICD-10-CM | POA: Diagnosis not present

## 2019-11-07 DIAGNOSIS — R0602 Shortness of breath: Secondary | ICD-10-CM | POA: Diagnosis not present

## 2019-11-07 DIAGNOSIS — I517 Cardiomegaly: Secondary | ICD-10-CM | POA: Diagnosis not present

## 2019-11-07 DIAGNOSIS — R079 Chest pain, unspecified: Secondary | ICD-10-CM | POA: Diagnosis not present

## 2019-11-07 DIAGNOSIS — I10 Essential (primary) hypertension: Secondary | ICD-10-CM | POA: Diagnosis not present

## 2019-11-07 DIAGNOSIS — E119 Type 2 diabetes mellitus without complications: Secondary | ICD-10-CM | POA: Diagnosis not present

## 2019-11-07 DIAGNOSIS — Z7984 Long term (current) use of oral hypoglycemic drugs: Secondary | ICD-10-CM | POA: Diagnosis not present

## 2019-11-07 DIAGNOSIS — R0789 Other chest pain: Secondary | ICD-10-CM | POA: Diagnosis not present

## 2019-11-13 DIAGNOSIS — Z6835 Body mass index (BMI) 35.0-35.9, adult: Secondary | ICD-10-CM | POA: Diagnosis not present

## 2019-11-13 DIAGNOSIS — F32A Depression, unspecified: Secondary | ICD-10-CM | POA: Insufficient documentation

## 2019-11-13 DIAGNOSIS — F329 Major depressive disorder, single episode, unspecified: Secondary | ICD-10-CM | POA: Diagnosis not present

## 2019-11-13 DIAGNOSIS — E782 Mixed hyperlipidemia: Secondary | ICD-10-CM | POA: Diagnosis not present

## 2019-11-13 DIAGNOSIS — E1169 Type 2 diabetes mellitus with other specified complication: Secondary | ICD-10-CM | POA: Diagnosis not present

## 2019-11-13 DIAGNOSIS — E785 Hyperlipidemia, unspecified: Secondary | ICD-10-CM | POA: Diagnosis not present

## 2019-11-13 DIAGNOSIS — N183 Chronic kidney disease, stage 3 unspecified: Secondary | ICD-10-CM | POA: Diagnosis not present

## 2019-11-13 DIAGNOSIS — R079 Chest pain, unspecified: Secondary | ICD-10-CM | POA: Diagnosis not present

## 2019-11-13 DIAGNOSIS — I10 Essential (primary) hypertension: Secondary | ICD-10-CM | POA: Diagnosis not present

## 2019-12-18 IMAGING — CR DG KNEE 1-2V PORT*L*
2 series · 2 of 2 positions shown · non-contrast
Comparison: Radiographs April 28, 2018.

CLINICAL DATA: Left total knee replacement.

EXAM:
PORTABLE LEFT KNEE - 1-2 VIEW

[lat]
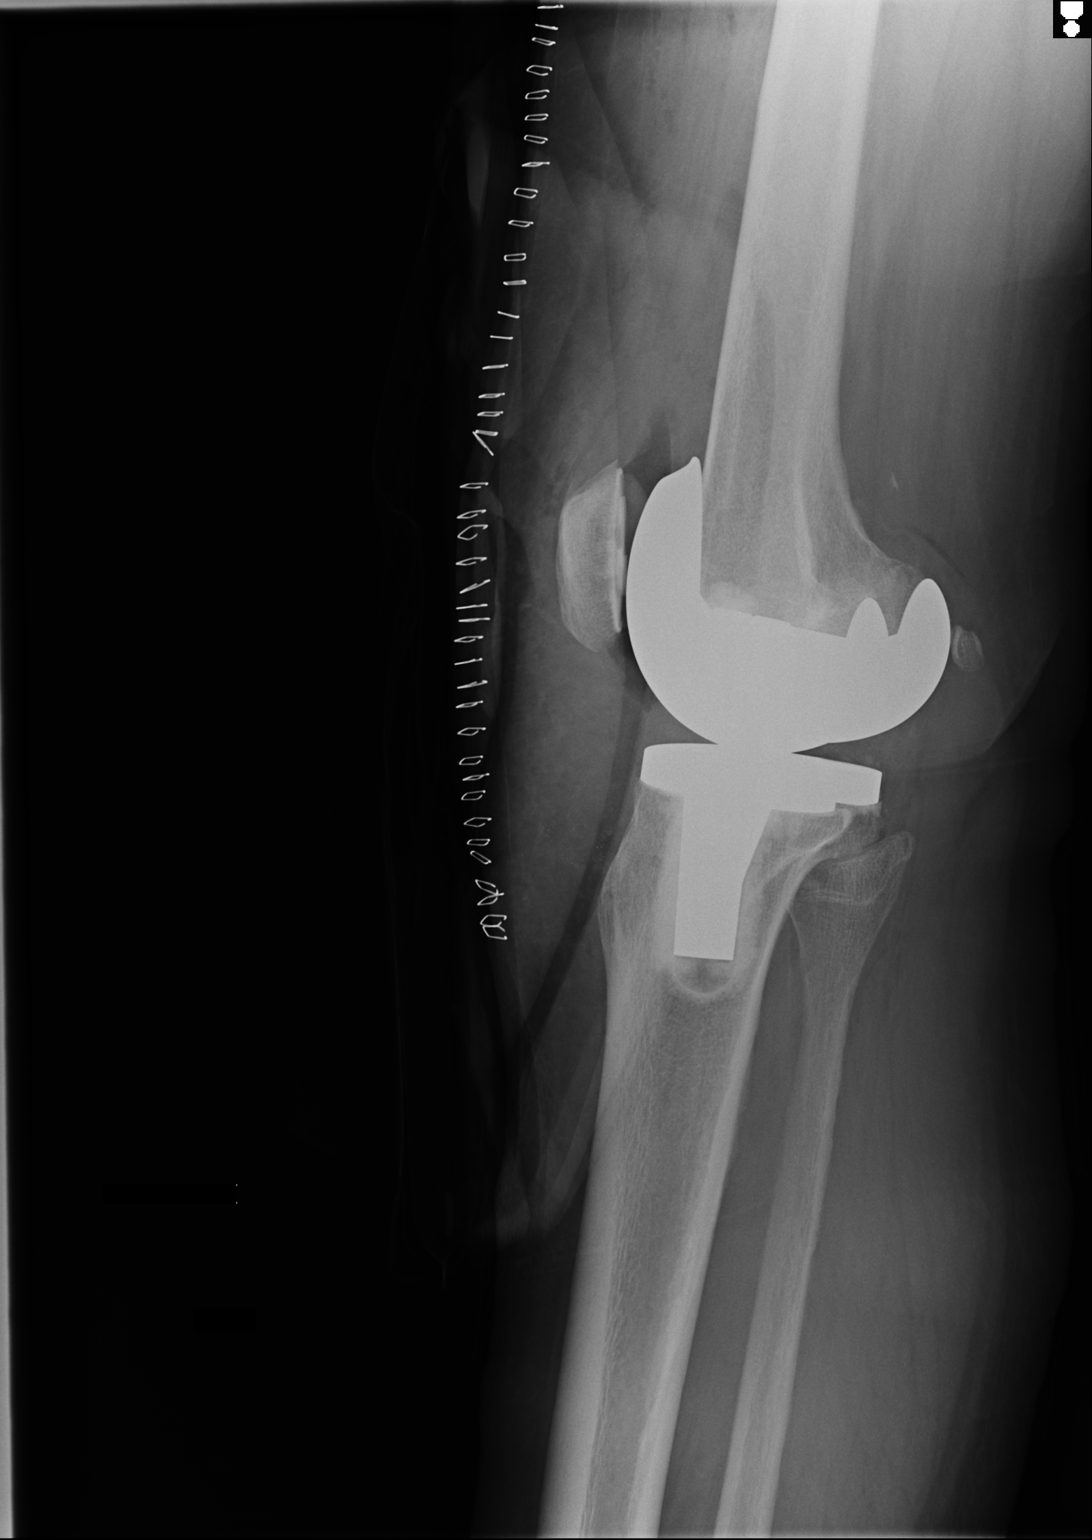

[oblique]
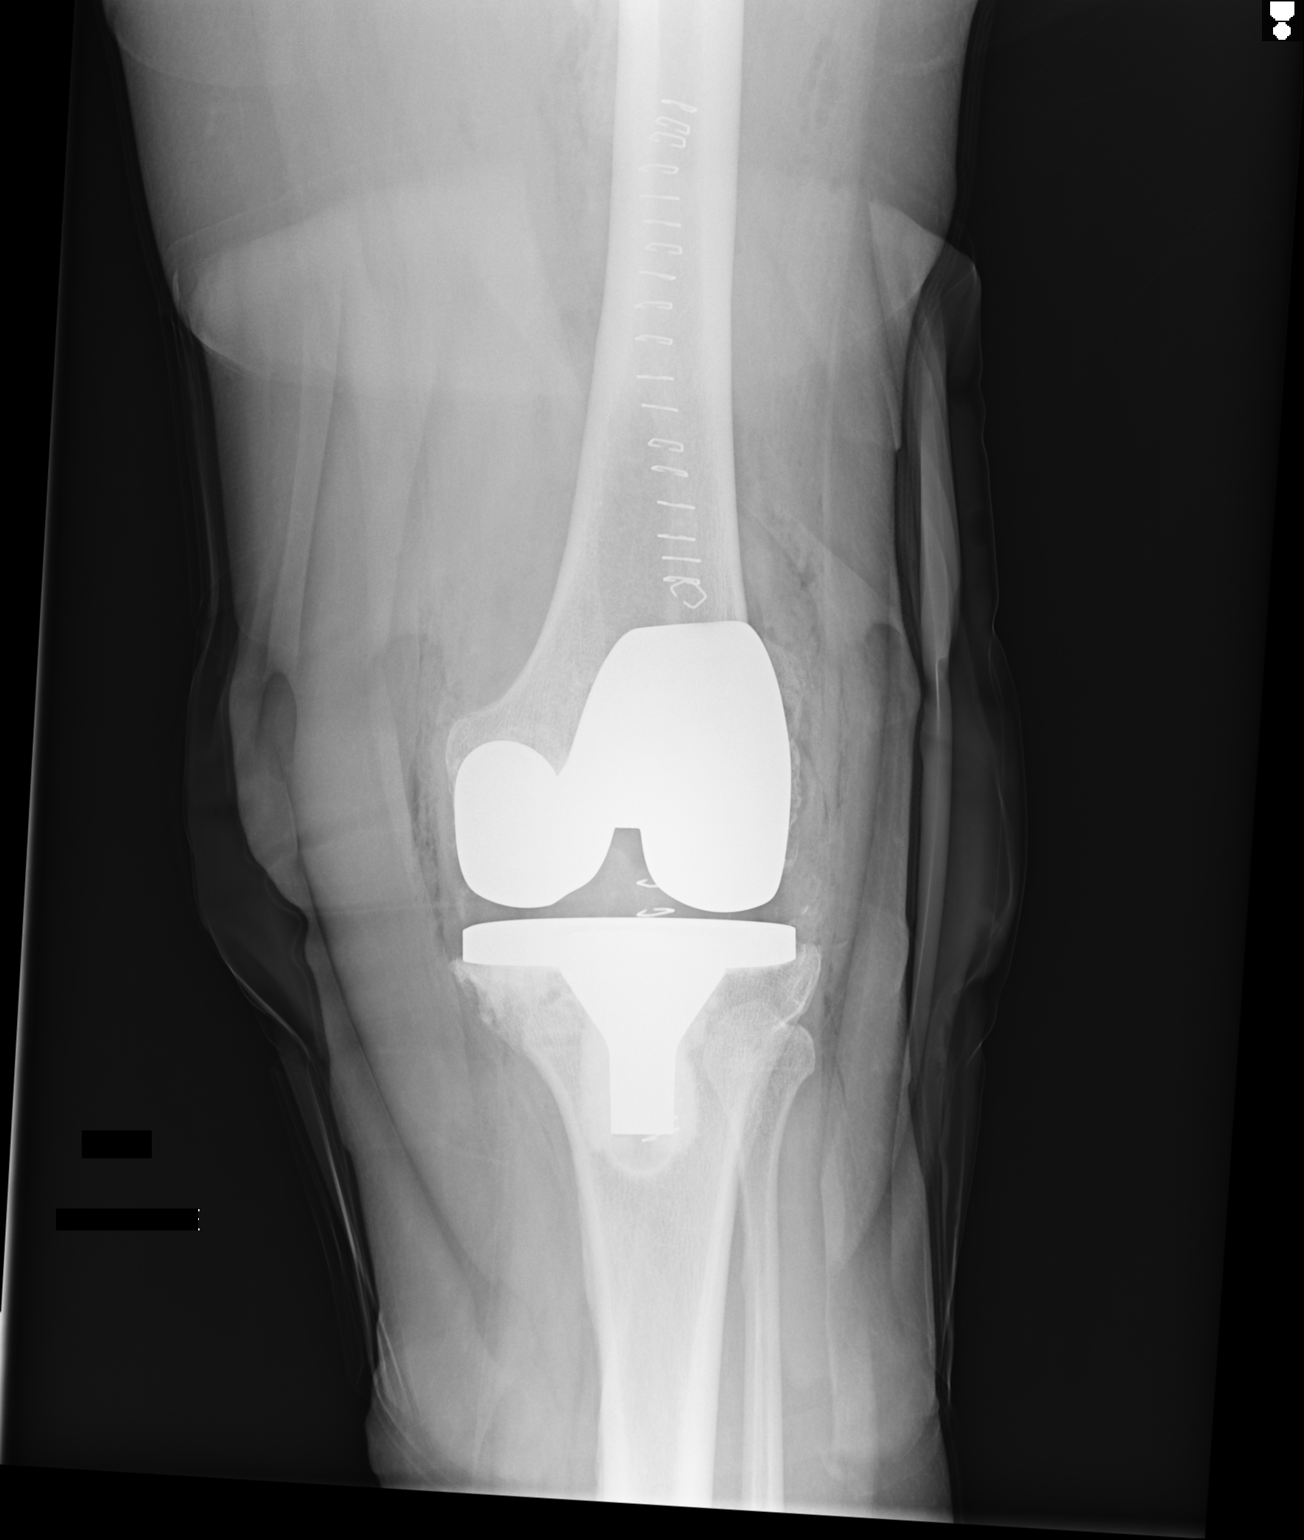

[2 of 2 positions shown; findings below may reference images not displayed]

FINDINGS: The femoral and tibial components appear to be well situated.
Expected postoperative changes are noted in the soft tissues
anteriorly. No fracture or dislocation is noted.
IMPRESSION: Status post left total knee arthroplasty.

## 2019-12-27 DIAGNOSIS — F329 Major depressive disorder, single episode, unspecified: Secondary | ICD-10-CM | POA: Diagnosis not present

## 2019-12-27 DIAGNOSIS — N183 Chronic kidney disease, stage 3 unspecified: Secondary | ICD-10-CM | POA: Diagnosis not present

## 2019-12-27 DIAGNOSIS — R079 Chest pain, unspecified: Secondary | ICD-10-CM | POA: Diagnosis not present

## 2019-12-27 DIAGNOSIS — E1169 Type 2 diabetes mellitus with other specified complication: Secondary | ICD-10-CM | POA: Diagnosis not present

## 2019-12-27 DIAGNOSIS — Z6835 Body mass index (BMI) 35.0-35.9, adult: Secondary | ICD-10-CM | POA: Diagnosis not present

## 2019-12-27 DIAGNOSIS — E782 Mixed hyperlipidemia: Secondary | ICD-10-CM | POA: Diagnosis not present

## 2019-12-27 DIAGNOSIS — Z23 Encounter for immunization: Secondary | ICD-10-CM | POA: Diagnosis not present

## 2019-12-27 DIAGNOSIS — I10 Essential (primary) hypertension: Secondary | ICD-10-CM | POA: Diagnosis not present

## 2019-12-28 ENCOUNTER — Encounter: Payer: Self-pay | Admitting: Cardiology

## 2019-12-28 ENCOUNTER — Ambulatory Visit (INDEPENDENT_AMBULATORY_CARE_PROVIDER_SITE_OTHER): Payer: Medicare PPO | Admitting: Cardiology

## 2019-12-28 VITALS — BP 134/78 | HR 77 | Ht 60.0 in | Wt 195.0 lb

## 2019-12-28 DIAGNOSIS — R0789 Other chest pain: Secondary | ICD-10-CM | POA: Diagnosis not present

## 2019-12-28 NOTE — Patient Instructions (Signed)
Your physician recommends that you schedule a follow-up appointment in: 4 MONTHS WITH DR BRANCH  Your physician recommends that you continue on your current medications as directed. Please refer to the Current Medication list given to you today.  Thank you for choosing [facility]!!    

## 2019-12-28 NOTE — Progress Notes (Signed)
Clinical Summary Gloria Sanchez is a 79 y.o.female seen today for follow up of the following medical problems.    1. Chest pain - admit 10/2019 with chest pain to [facility] - ruled out for MI with enzymes. Nuclear stress was negative  10/2019 echo LVEF >70%, grade I diastolic dysfunction 10/2019 Nuclear stress: no ischemia, low risk  - One episode of severe chest pain leading up to her admission. Pressing feeling,someone sitting on chest. Midchest, severe. Some SOB. Lasted a few mintues.  - since discharge, very faint symptoms infrequent. Some recent SOB/DOE. Some cough, wheezing at times. No tobacoc history but heavy secondhand exposure. No recent edema   Has had covid vaccine, got 2nd in February.    Past Medical History:  Diagnosis Date  . Allergic rhinitis   . Arthritis   . Common bile duct dilation 05/22/2016  . Diverticulosis   . DM (diabetes mellitus) (HCC)   . Essential hypertension 05/22/2016  . GERD (gastroesophageal reflux disease)   . High cholesterol 05/22/2016  . Hyperlipidemia   . Hypertension   . Restless leg syndrome      No Known Allergies   Current Outpatient Medications  Medication Sig Dispense Refill  . amLODipine (NORVASC) 5 MG tablet Take 1 tablet (5 mg total) by mouth daily. 30 tablet 0  . aspirin EC 325 MG EC tablet Take 1 tablet (325 mg total) by mouth daily with breakfast. 30 tablet 0  . calcium carbonate (TUMS - DOSED IN MG ELEMENTAL CALCIUM) 500 MG chewable tablet Chew 1 tablet by mouth 3 (three) times daily as needed for indigestion or heartburn.    . clonazePAM (KLONOPIN) 0.5 MG tablet Take 0.5 tablets (0.25 mg total) by mouth at bedtime as needed for anxiety. 5 tablet 0  . docusate sodium (COLACE) 100 MG capsule Take 1 capsule (100 mg total) by mouth 2 (two) times daily. 10 capsule 0  . gabapentin (NEURONTIN) 300 MG capsule Take 1 capsule (300 mg total) by mouth 3 (three) times daily. 90 capsule 1  . hydrochlorothiazide (HYDRODIURIL) 25  MG tablet Take 1 tablet (25 mg total) by mouth daily. 30 tablet 0  . ipratropium (ATROVENT) 0.02 % nebulizer solution Take 2.5 mLs (0.5 mg total) by nebulization every 6 (six) hours as needed for wheezing or shortness of breath. Inhale 1 vial every 6 hours as needed for wheezing 75 mL 0  . losartan (COZAAR) 100 MG tablet Take 1 tablet (100 mg total) by mouth daily. (Patient not taking: Reported on 07/17/2019) 30 tablet 0  . metFORMIN (GLUCOPHAGE-XR) 500 MG 24 hr tablet Take 1 tablet (500 mg total) by mouth at bedtime. 30 tablet 0  . methocarbamol (ROBAXIN) 500 MG tablet Take 1 tablet (500 mg total) by mouth every 6 (six) hours as needed for muscle spasms. 120 tablet 0  . NON FORMULARY Diet Type:  NAS    . ondansetron (ZOFRAN) 4 MG tablet Take 1 tablet (4 mg total) by mouth every 8 (eight) hours as needed for nausea or vomiting. 20 tablet 0  . pantoprazole (PROTONIX) 40 MG tablet Take 1 tablet (40 mg total) by mouth daily. 30 tablet 0  . potassium chloride SA (K-DUR,KLOR-CON) 20 MEQ tablet Take 1 tablet (20 mEq total) by mouth 3 (three) times daily. (Patient not taking: Reported on 07/17/2019) 90 tablet 0  . simvastatin (ZOCOR) 20 MG tablet Take 1 tablet (20 mg total) by mouth at bedtime. 30 tablet 0   No current facility-administered medications for  this visit.     Past Surgical History:  Procedure Laterality Date  . ANTERIOR AND POSTERIOR REPAIR N/A 12/29/2016   Procedure: ANTERIOR (CYSTOCELE) AND POSTERIOR REPAIR (RECTOCELE);  Surgeon: Tilda Burrow, MD;  Location: [facility];  Service: Gynecology;  Laterality: N/A;  . CHOLECYSTECTOMY    . complete hysterectomy    . ERCP W/ SPHICTEROTOMY  1974  . REPLACEMENT TOTAL KNEE    . TOTAL KNEE ARTHROPLASTY Left 05/10/2018   Procedure: TOTAL KNEE ARTHROPLASTY;  Surgeon: Vickki Hearing, MD;  Location: [facility];  Service: Orthopedics;  Laterality: Left;     No Known Allergies    Family History  Problem Relation Age of Onset  . Diabetes Sister     . Emphysema Father   . Dementia Mother   . Hypertension Mother   . COPD Brother   . Heart attack Brother   . Breast cancer Sister   . Diabetes Son      Social History Gloria Sanchez reports that she has never smoked. She has never used smokeless tobacco. Gloria Sanchez reports no history of alcohol use.   Review of Systems CONSTITUTIONAL: No weight loss, fever, chills, weakness or fatigue.  HEENT: Eyes: No visual loss, blurred vision, double vision or yellow sclerae.No hearing loss, sneezing, congestion, runny nose or sore throat.  SKIN: No rash or itching.  CARDIOVASCULAR: per hpi RESPIRATORY: per hpi GASTROINTESTINAL: No anorexia, nausea, vomiting or diarrhea. No abdominal pain or blood.  GENITOURINARY: No burning on urination, no polyuria NEUROLOGICAL: No headache, dizziness, syncope, paralysis, ataxia, numbness or tingling in the extremities. No change in bowel or bladder control.  MUSCULOSKELETAL: No muscle, back pain, joint pain or stiffness.  LYMPHATICS: No enlarged nodes. No history of splenectomy.  PSYCHIATRIC: No history of depression or anxiety.  ENDOCRINOLOGIC: No reports of sweating, cold or heat intolerance. No polyuria or polydipsia.  Marland Kitchen   Physical Examination Today's Vitals   12/28/19 1018  BP: 134/78  Pulse: 77  SpO2: 98%  Weight: 195 lb (88.5 kg)  Height: 5' (1.524 m)   Body mass index is 38.08 kg/m.  Gen: resting comfortably, no acute distress HEENT: no scleral icterus, pupils equal round and reactive, no palptable cervical adenopathy,  CV: RRR, 2/6 systolic murmu rusb, no jvd Resp: Clear to auscultation bilaterally GI: abdomen is soft, non-tender, non-distended, normal bowel sounds, no hepatosplenomegaly MSK: extremities are warm, no edema.  Skin: warm, no rash Neuro:  no focal deficits Psych: appropriate affect      Assessment and Plan  1. Chest pain - isolated episode of severe chest pain leading up to admission where she had a benign echo and  stress test - on my history today denies any significant ongoing symptoms - in absence of compelling evidence of possible ischemia  would not pursue cath at this time. If development of significant consistent exertional symptosm could consider at that time.   EKG SR, no ischemic changes    Antoine Poche, M.D.

## 2020-01-01 DIAGNOSIS — Z1231 Encounter for screening mammogram for malignant neoplasm of breast: Secondary | ICD-10-CM | POA: Diagnosis not present

## 2020-01-20 ENCOUNTER — Other Ambulatory Visit: Payer: Self-pay | Admitting: Orthopedic Surgery

## 2020-01-20 DIAGNOSIS — Z96652 Presence of left artificial knee joint: Secondary | ICD-10-CM

## 2020-01-25 ENCOUNTER — Ambulatory Visit: Payer: Medicare PPO | Admitting: Cardiology

## 2020-02-07 DIAGNOSIS — E1169 Type 2 diabetes mellitus with other specified complication: Secondary | ICD-10-CM | POA: Diagnosis not present

## 2020-02-07 DIAGNOSIS — K2100 Gastro-esophageal reflux disease with esophagitis, without bleeding: Secondary | ICD-10-CM | POA: Diagnosis not present

## 2020-02-07 DIAGNOSIS — E782 Mixed hyperlipidemia: Secondary | ICD-10-CM | POA: Diagnosis not present

## 2020-02-07 DIAGNOSIS — I10 Essential (primary) hypertension: Secondary | ICD-10-CM | POA: Diagnosis not present

## 2020-02-07 DIAGNOSIS — N183 Chronic kidney disease, stage 3 unspecified: Secondary | ICD-10-CM | POA: Diagnosis not present

## 2020-02-07 DIAGNOSIS — E7801 Familial hypercholesterolemia: Secondary | ICD-10-CM | POA: Diagnosis not present

## 2020-02-07 DIAGNOSIS — E119 Type 2 diabetes mellitus without complications: Secondary | ICD-10-CM | POA: Diagnosis not present

## 2020-02-12 DIAGNOSIS — Z6835 Body mass index (BMI) 35.0-35.9, adult: Secondary | ICD-10-CM | POA: Diagnosis not present

## 2020-02-12 DIAGNOSIS — I10 Essential (primary) hypertension: Secondary | ICD-10-CM | POA: Diagnosis not present

## 2020-02-12 DIAGNOSIS — R079 Chest pain, unspecified: Secondary | ICD-10-CM | POA: Diagnosis not present

## 2020-02-12 DIAGNOSIS — E782 Mixed hyperlipidemia: Secondary | ICD-10-CM | POA: Diagnosis not present

## 2020-02-12 DIAGNOSIS — E1169 Type 2 diabetes mellitus with other specified complication: Secondary | ICD-10-CM | POA: Diagnosis not present

## 2020-02-12 DIAGNOSIS — G2589 Other specified extrapyramidal and movement disorders: Secondary | ICD-10-CM | POA: Diagnosis not present

## 2020-02-12 DIAGNOSIS — E1140 Type 2 diabetes mellitus with diabetic neuropathy, unspecified: Secondary | ICD-10-CM | POA: Diagnosis not present

## 2020-02-12 DIAGNOSIS — N8182 Incompetence or weakening of pubocervical tissue: Secondary | ICD-10-CM | POA: Diagnosis not present

## 2020-02-19 ENCOUNTER — Ambulatory Visit: Payer: Medicare PPO | Admitting: Cardiology

## 2020-05-01 ENCOUNTER — Ambulatory Visit: Payer: Medicare PPO | Admitting: Cardiology

## 2020-05-01 ENCOUNTER — Other Ambulatory Visit: Payer: Self-pay | Admitting: Orthopedic Surgery

## 2020-05-01 ENCOUNTER — Encounter: Payer: Self-pay | Admitting: Cardiology

## 2020-05-01 VITALS — BP 130/74 | HR 95 | Ht 60.0 in | Wt 194.4 lb

## 2020-05-01 DIAGNOSIS — Z96652 Presence of left artificial knee joint: Secondary | ICD-10-CM

## 2020-05-01 DIAGNOSIS — R0789 Other chest pain: Secondary | ICD-10-CM | POA: Diagnosis not present

## 2020-05-01 NOTE — Patient Instructions (Signed)

## 2020-05-01 NOTE — Progress Notes (Signed)
Clinical Summary Gloria Sanchez is a 80 y.o.female seen today for follow up of the following medical problems.    1. Chest pain - admit 10/2019 with chest pain to [facility] - ruled out for MI with enzymes. Nuclear stress was negative  10/2019 echo LVEF >70%, grade I diastolic dysfunction 10/2019 Nuclear stress: no ischemia, low risk  - One episode of severe chest pain leading up to her admission. Pressing feeling,someone sitting on chest. Midchest, severe. Some SOB. Lasted a few mintues.  - since discharge, very faint symptoms infrequent. Some recent SOB/DOE. Some cough, wheezing at times. No tobacoc history but heavy secondhand exposure. No recent edema   - no recent chest pains. No SOB or DOE.          Past Medical History:  Diagnosis Date  . Allergic rhinitis   . Arthritis   . Common bile duct dilation 05/22/2016  . Diverticulosis   . DM (diabetes mellitus) (HCC)   . Essential hypertension 05/22/2016  . GERD (gastroesophageal reflux disease)   . High cholesterol 05/22/2016  . Hyperlipidemia   . Hypertension   . Restless leg syndrome      No Known Allergies   Current Outpatient Medications  Medication Sig Dispense Refill  . amLODipine (NORVASC) 5 MG tablet Take 1 tablet (5 mg total) by mouth daily. 30 tablet 0  . aspirin EC 325 MG EC tablet Take 1 tablet (325 mg total) by mouth daily with breakfast. 30 tablet 0  . calcium carbonate (TUMS - DOSED IN MG ELEMENTAL CALCIUM) 500 MG chewable tablet Chew 1 tablet by mouth 3 (three) times daily as needed for indigestion or heartburn.    . clonazePAM (KLONOPIN) 0.5 MG tablet Take 0.5 tablets (0.25 mg total) by mouth at bedtime as needed for anxiety. 5 tablet 0  . docusate sodium (COLACE) 100 MG capsule Take 1 capsule (100 mg total) by mouth 2 (two) times daily. 10 capsule 0  . gabapentin (NEURONTIN) 300 MG capsule Take 1 capsule (300 mg total) by mouth 3 (three) times daily. 90 capsule 1  . hydrochlorothiazide  (HYDRODIURIL) 25 MG tablet Take 1 tablet (25 mg total) by mouth daily. 30 tablet 0  . losartan (COZAAR) 100 MG tablet Take 1 tablet (100 mg total) by mouth daily. 30 tablet 0  . metFORMIN (GLUCOPHAGE-XR) 500 MG 24 hr tablet Take 1 tablet (500 mg total) by mouth at bedtime. 30 tablet 0  . methocarbamol (ROBAXIN) 500 MG tablet TAKE 1 TABLET (500 MG TOTAL) BY MOUTH EVERY 6 (SIX) HOURS AS NEEDED FOR MUSCLE SPASMS. 120 tablet 0  . NON FORMULARY Diet Type:  NAS    . ondansetron (ZOFRAN) 4 MG tablet Take 1 tablet (4 mg total) by mouth every 8 (eight) hours as needed for nausea or vomiting. 20 tablet 0  . pantoprazole (PROTONIX) 40 MG tablet Take 1 tablet (40 mg total) by mouth daily. 30 tablet 0  . potassium chloride SA (K-DUR,KLOR-CON) 20 MEQ tablet Take 1 tablet (20 mEq total) by mouth 3 (three) times daily. 90 tablet 0  . simvastatin (ZOCOR) 20 MG tablet Take 1 tablet (20 mg total) by mouth at bedtime. 30 tablet 0   No current facility-administered medications for this visit.     Past Surgical History:  Procedure Laterality Date  . ANTERIOR AND POSTERIOR REPAIR N/A 12/29/2016   Procedure: ANTERIOR (CYSTOCELE) AND POSTERIOR REPAIR (RECTOCELE);  Surgeon: Tilda Burrow, MD;  Location: [facility];  Service: Gynecology;  Laterality: N/A;  .  CHOLECYSTECTOMY    . complete hysterectomy    . ERCP W/ SPHICTEROTOMY  1974  . REPLACEMENT TOTAL KNEE    . TOTAL KNEE ARTHROPLASTY Left 05/10/2018   Procedure: TOTAL KNEE ARTHROPLASTY;  Surgeon: Vickki Hearing, MD;  Location: [facility];  Service: Orthopedics;  Laterality: Left;     No Known Allergies    Family History  Problem Relation Age of Onset  . Diabetes Sister   . Emphysema Father   . Dementia Mother   . Hypertension Mother   . COPD Brother   . Heart attack Brother   . Breast cancer Sister   . Diabetes Son      Social History Gloria Sanchez reports that she has never smoked. She has never used smokeless tobacco. Gloria Sanchez reports no history  of alcohol use.   Review of Systems CONSTITUTIONAL: No weight loss, fever, chills, weakness or fatigue.  HEENT: Eyes: No visual loss, blurred vision, double vision or yellow sclerae.No hearing loss, sneezing, congestion, runny nose or sore throat.  SKIN: No rash or itching.  CARDIOVASCULAR: per hpi RESPIRATORY: No shortness of breath, cough or sputum.  GASTROINTESTINAL: No anorexia, nausea, vomiting or diarrhea. No abdominal pain or blood.  GENITOURINARY: No burning on urination, no polyuria NEUROLOGICAL: No headache, dizziness, syncope, paralysis, ataxia, numbness or tingling in the extremities. No change in bowel or bladder control.  MUSCULOSKELETAL: No muscle, back pain, joint pain or stiffness.  LYMPHATICS: No enlarged nodes. No history of splenectomy.  PSYCHIATRIC: No history of depression or anxiety.  ENDOCRINOLOGIC: No reports of sweating, cold or heat intolerance. No polyuria or polydipsia.  Marland Kitchen   Physical Examination Today's Vitals   05/01/20 1314  BP: 130/74  Pulse: 95  SpO2: 97%  Weight: 194 lb 6.4 oz (88.2 kg)  Height: 5' (1.524 m)   Body mass index is 37.97 kg/m.  Gen: resting comfortably, no acute distress HEENT: no scleral icterus, pupils equal round and reactive, no palptable cervical adenopathy,  CV: RRR, no m/r/g, no jvd Resp: Clear to auscultation bilaterally GI: abdomen is soft, non-tender, non-distended, normal bowel sounds, no hepatosplenomegaly MSK: extremities are warm, no edema.  Skin: warm, no rash Neuro:  no focal deficits Psych: appropriate affect     Assessment and Plan  1. Chest pain - isolated episode of severe chest pain leading up to admission where she had a benign echo and stress test -no significant recurrence - no further cardiac workup at this time. She may f/u as needed.       Antoine Poche, M.D.

## 2020-06-07 DIAGNOSIS — E119 Type 2 diabetes mellitus without complications: Secondary | ICD-10-CM | POA: Diagnosis not present

## 2020-06-07 DIAGNOSIS — E782 Mixed hyperlipidemia: Secondary | ICD-10-CM | POA: Diagnosis not present

## 2020-06-07 DIAGNOSIS — Z0000 Encounter for general adult medical examination without abnormal findings: Secondary | ICD-10-CM | POA: Diagnosis not present

## 2020-06-07 DIAGNOSIS — K2100 Gastro-esophageal reflux disease with esophagitis, without bleeding: Secondary | ICD-10-CM | POA: Diagnosis not present

## 2020-06-07 DIAGNOSIS — E7849 Other hyperlipidemia: Secondary | ICD-10-CM | POA: Diagnosis not present

## 2020-06-12 DIAGNOSIS — Z23 Encounter for immunization: Secondary | ICD-10-CM | POA: Diagnosis not present

## 2020-06-12 DIAGNOSIS — Z0001 Encounter for general adult medical examination with abnormal findings: Secondary | ICD-10-CM | POA: Diagnosis not present

## 2020-06-12 DIAGNOSIS — N8182 Incompetence or weakening of pubocervical tissue: Secondary | ICD-10-CM | POA: Diagnosis not present

## 2020-06-12 DIAGNOSIS — I10 Essential (primary) hypertension: Secondary | ICD-10-CM | POA: Diagnosis not present

## 2020-06-12 DIAGNOSIS — G2589 Other specified extrapyramidal and movement disorders: Secondary | ICD-10-CM | POA: Diagnosis not present

## 2020-06-12 DIAGNOSIS — E782 Mixed hyperlipidemia: Secondary | ICD-10-CM | POA: Diagnosis not present

## 2020-06-12 DIAGNOSIS — R079 Chest pain, unspecified: Secondary | ICD-10-CM | POA: Diagnosis not present

## 2020-06-12 DIAGNOSIS — K219 Gastro-esophageal reflux disease without esophagitis: Secondary | ICD-10-CM | POA: Diagnosis not present

## 2020-08-13 DIAGNOSIS — Z20828 Contact with and (suspected) exposure to other viral communicable diseases: Secondary | ICD-10-CM | POA: Diagnosis not present

## 2020-08-13 DIAGNOSIS — J069 Acute upper respiratory infection, unspecified: Secondary | ICD-10-CM | POA: Diagnosis not present

## 2020-08-13 DIAGNOSIS — M545 Low back pain, unspecified: Secondary | ICD-10-CM | POA: Diagnosis not present

## 2020-08-13 DIAGNOSIS — G5600 Carpal tunnel syndrome, unspecified upper limb: Secondary | ICD-10-CM | POA: Diagnosis not present

## 2020-08-19 ENCOUNTER — Ambulatory Visit: Payer: Medicare PPO

## 2020-08-19 ENCOUNTER — Other Ambulatory Visit: Payer: Self-pay

## 2020-08-19 ENCOUNTER — Encounter: Payer: Self-pay | Admitting: Orthopedic Surgery

## 2020-08-19 ENCOUNTER — Ambulatory Visit: Payer: Medicare PPO | Admitting: Orthopedic Surgery

## 2020-08-19 VITALS — BP 177/94 | HR 85 | Ht 60.0 in | Wt 194.0 lb

## 2020-08-19 DIAGNOSIS — M5417 Radiculopathy, lumbosacral region: Secondary | ICD-10-CM | POA: Diagnosis not present

## 2020-08-19 DIAGNOSIS — M545 Low back pain, unspecified: Secondary | ICD-10-CM

## 2020-08-19 DIAGNOSIS — I2089 Other forms of angina pectoris: Secondary | ICD-10-CM | POA: Insufficient documentation

## 2020-08-19 DIAGNOSIS — I208 Other forms of angina pectoris: Secondary | ICD-10-CM | POA: Insufficient documentation

## 2020-08-19 NOTE — Patient Instructions (Addendum)
Continue steroids and order MRI for reflex change right leg  While we are working on your approval for MRI please go ahead and call to schedule your appointment with [facility] within at least one (1) week.   Central Scheduling [phone]

## 2020-08-19 NOTE — Progress Notes (Signed)
Chief Complaint  Patient presents with  . Knee Pain    Patient reports that she is having pain in the back upper part of leg and not sure if its muscle related,    80 year old female presents with bilateral leg pain history of lower back pain.  Primary care did give her some steroids which seem to make it somewhat better.  She says a couple weeks ago she could not walk without a lot of difficulty  Symptoms seem to involve both lower legs primarily posterior thighs and then radiates down into both feet this is associated with lower back pain  Review of Systems  Constitutional: Negative for chills, fever, malaise/fatigue and weight loss.  Gastrointestinal: Negative for constipation.       Denies loss bowel control   Genitourinary: Negative.        Denies urinary retention or los of bladder control      Past Medical History:  Diagnosis Date  . Allergic rhinitis   . Arthritis   . Common bile duct dilation 05/22/2016  . Diverticulosis   . DM (diabetes mellitus) (HCC)   . Essential hypertension 05/22/2016  . GERD (gastroesophageal reflux disease)   . High cholesterol 05/22/2016  . Hyperlipidemia   . Hypertension   . Restless leg syndrome    Xrays: scoliosis spondyloslisthesis  Physical Exam Constitutional:      General: She is not in acute distress.    Appearance: She is well-developed.     Comments: Well developed, well nourished Normal grooming and hygiene     Cardiovascular:     Comments: No peripheral edema Skin:    General: Skin is warm and dry.  Neurological:     Mental Status: She is alert and oriented to person, place, and time.     Sensory: No sensory deficit.     Coordination: Coordination normal.     Gait: Gait normal.     Deep Tendon Reflexes: Reflexes are normal and symmetric.  Psychiatric:        Mood and Affect: Mood normal.        Behavior: Behavior normal.        Thought Content: Thought content normal.        Judgment: Judgment normal.     Comments:  Affect normal    ' Right foot numb reflex change right knee   Tender lower back   Encounter Diagnoses  Name Primary?  . Lumbar pain Yes  . Lumbosacral radiculopathy at L5    Continue steroids and order MRI for reflex change right leg

## 2020-09-04 ENCOUNTER — Ambulatory Visit (HOSPITAL_COMMUNITY)
Admission: RE | Admit: 2020-09-04 | Discharge: 2020-09-04 | Disposition: A | Discharge status: Home / Self Care | Payer: Medicare PPO | Source: Ambulatory Visit | Attending: Orthopedic Surgery | Admitting: Orthopedic Surgery

## 2020-09-04 DIAGNOSIS — M545 Low back pain, unspecified: Secondary | ICD-10-CM

## 2020-09-09 ENCOUNTER — Ambulatory Visit: Payer: Medicare PPO | Admitting: Orthopedic Surgery

## 2020-09-19 ENCOUNTER — Encounter: Payer: Self-pay | Admitting: Orthopedic Surgery

## 2020-09-19 ENCOUNTER — Ambulatory Visit (INDEPENDENT_AMBULATORY_CARE_PROVIDER_SITE_OTHER): Payer: Medicare PPO | Admitting: Orthopedic Surgery

## 2020-09-19 ENCOUNTER — Other Ambulatory Visit: Payer: Self-pay

## 2020-09-19 VITALS — Ht 60.0 in | Wt 197.0 lb

## 2020-09-19 DIAGNOSIS — M5417 Radiculopathy, lumbosacral region: Secondary | ICD-10-CM

## 2020-09-19 DIAGNOSIS — M545 Low back pain, unspecified: Secondary | ICD-10-CM

## 2020-09-19 NOTE — Progress Notes (Signed)
Chief Complaint  Patient presents with   Back Pain    Here for MRI results.    Encounter Diagnoses  Name Primary?   Lumbar pain Yes   Lumbosacral radiculopathy at L5     Neurosurgery MRI the report is listed below  She has extensive disease from L1-S1 with more severe disease at L3-S1  My interpretation is that she has extensive lumbar disc disease with multiple areas of disc bulging ligamentum flavum buckling hypertrophy with facet arthritis and foraminal stenosis  She says she was having severe pain last week but the last 3 days has improved  At this point there is nothing else I can do I refer her to the appropriate specialist for definitive management  Recommend neurosurgical consult  Disc levels:   T12-L1: Sagittal sequences only. Circumferential disc bulge and endplate ridging. No evidence of foraminal or canal stenosis.   L1-L2: Circumferential disc bulge with endplate ridging. Minimal bilateral facet arthropathy. Moderate left and mild right foraminal stenosis. No canal stenosis. No significant interval progression from prior.   L2-L3: Scrotal disc bulge with endplate ridging. Minimal bilateral facet arthropathy. Mild bilateral foraminal stenosis. No canal stenosis. No significant interval progression from prior.   L3-L4: Circumferential disc bulge, eccentric to the right. Bilateral facet arthropathy and mild ligamentum flavum buckling. Impress upon the ventral thecal sac without significant canal stenosis. Minimal right subarticular recess stenosis. Mild right foraminal stenosis. No significant interval progression from prior.   L4-L5: Disc uncovering with diffuse disc bulge, eccentric to the right. Severe bilateral facet arthropathy and ligamentum flavum buckling. Moderate to severe canal stenosis with severe right and mild left foraminal stenosis. No significant interval progression from prior.   L5-S1: Disc uncovering with diffuse disc bulge including  small left foraminal protrusion. Ligamentum flavum buckling. Bilateral facet arthropathy. No significant foraminal or canal stenosis. No significant interval progression from prior.   IMPRESSION: 1. Multilevel lumbar spondylosis, not significantly progressed compared to prior MRI. 2. Moderate-to-severe canal stenosis with severe right and mild left foraminal stenosis at L4-L5. 3. Moderate left foraminal stenosis at L1-L2.     Electronically Signed   By: Duanne Guess D.O.   On: 09/04/2020 15:48

## 2020-09-19 NOTE — Addendum Note (Signed)
Addended byCaffie Damme on: 09/19/2020 03:47 PM   Modules accepted: Orders

## 2020-10-15 ENCOUNTER — Other Ambulatory Visit (HOSPITAL_COMMUNITY): Payer: Self-pay | Admitting: Neurosurgery

## 2020-10-15 DIAGNOSIS — M5412 Radiculopathy, cervical region: Secondary | ICD-10-CM

## 2020-10-15 DIAGNOSIS — Z6836 Body mass index (BMI) 36.0-36.9, adult: Secondary | ICD-10-CM | POA: Diagnosis not present

## 2020-10-15 DIAGNOSIS — M48062 Spinal stenosis, lumbar region with neurogenic claudication: Secondary | ICD-10-CM | POA: Diagnosis not present

## 2020-10-22 DIAGNOSIS — E782 Mixed hyperlipidemia: Secondary | ICD-10-CM | POA: Diagnosis not present

## 2020-10-22 DIAGNOSIS — I10 Essential (primary) hypertension: Secondary | ICD-10-CM | POA: Diagnosis not present

## 2020-10-22 DIAGNOSIS — N183 Chronic kidney disease, stage 3 unspecified: Secondary | ICD-10-CM | POA: Diagnosis not present

## 2020-10-22 DIAGNOSIS — E7800 Pure hypercholesterolemia, unspecified: Secondary | ICD-10-CM | POA: Diagnosis not present

## 2020-10-22 DIAGNOSIS — E7849 Other hyperlipidemia: Secondary | ICD-10-CM | POA: Diagnosis not present

## 2020-10-22 DIAGNOSIS — E1140 Type 2 diabetes mellitus with diabetic neuropathy, unspecified: Secondary | ICD-10-CM | POA: Diagnosis not present

## 2020-10-22 DIAGNOSIS — K2100 Gastro-esophageal reflux disease with esophagitis, without bleeding: Secondary | ICD-10-CM | POA: Diagnosis not present

## 2020-10-22 DIAGNOSIS — E7801 Familial hypercholesterolemia: Secondary | ICD-10-CM | POA: Diagnosis not present

## 2020-10-24 DIAGNOSIS — N8182 Incompetence or weakening of pubocervical tissue: Secondary | ICD-10-CM | POA: Diagnosis not present

## 2020-10-24 DIAGNOSIS — I10 Essential (primary) hypertension: Secondary | ICD-10-CM | POA: Diagnosis not present

## 2020-10-24 DIAGNOSIS — E1140 Type 2 diabetes mellitus with diabetic neuropathy, unspecified: Secondary | ICD-10-CM | POA: Diagnosis not present

## 2020-10-24 DIAGNOSIS — F329 Major depressive disorder, single episode, unspecified: Secondary | ICD-10-CM | POA: Diagnosis not present

## 2020-10-24 DIAGNOSIS — G2589 Other specified extrapyramidal and movement disorders: Secondary | ICD-10-CM | POA: Diagnosis not present

## 2020-10-24 DIAGNOSIS — R079 Chest pain, unspecified: Secondary | ICD-10-CM | POA: Diagnosis not present

## 2020-10-24 DIAGNOSIS — E7849 Other hyperlipidemia: Secondary | ICD-10-CM | POA: Diagnosis not present

## 2020-10-24 DIAGNOSIS — Z23 Encounter for immunization: Secondary | ICD-10-CM | POA: Diagnosis not present

## 2020-10-29 ENCOUNTER — Ambulatory Visit (HOSPITAL_COMMUNITY)
Admission: RE | Admit: 2020-10-29 | Discharge: 2020-10-29 | Disposition: A | Discharge status: Home / Self Care | Payer: Medicare PPO | Source: Ambulatory Visit | Attending: Neurosurgery | Admitting: Neurosurgery

## 2020-10-29 ENCOUNTER — Other Ambulatory Visit: Payer: Self-pay

## 2020-10-29 DIAGNOSIS — M5412 Radiculopathy, cervical region: Secondary | ICD-10-CM | POA: Insufficient documentation

## 2020-10-29 DIAGNOSIS — M542 Cervicalgia: Secondary | ICD-10-CM | POA: Diagnosis not present

## 2020-10-30 ENCOUNTER — Ambulatory Visit: Payer: Medicare PPO | Admitting: Cardiology

## 2020-10-30 DIAGNOSIS — M48062 Spinal stenosis, lumbar region with neurogenic claudication: Secondary | ICD-10-CM | POA: Diagnosis not present

## 2020-11-06 DIAGNOSIS — G8929 Other chronic pain: Secondary | ICD-10-CM | POA: Diagnosis not present

## 2020-11-06 DIAGNOSIS — E1142 Type 2 diabetes mellitus with diabetic polyneuropathy: Secondary | ICD-10-CM | POA: Diagnosis not present

## 2020-11-06 DIAGNOSIS — F419 Anxiety disorder, unspecified: Secondary | ICD-10-CM | POA: Diagnosis not present

## 2020-11-06 DIAGNOSIS — I129 Hypertensive chronic kidney disease with stage 1 through stage 4 chronic kidney disease, or unspecified chronic kidney disease: Secondary | ICD-10-CM | POA: Diagnosis not present

## 2020-11-06 DIAGNOSIS — E1122 Type 2 diabetes mellitus with diabetic chronic kidney disease: Secondary | ICD-10-CM | POA: Diagnosis not present

## 2020-11-06 DIAGNOSIS — E669 Obesity, unspecified: Secondary | ICD-10-CM | POA: Diagnosis not present

## 2020-11-06 DIAGNOSIS — F329 Major depressive disorder, single episode, unspecified: Secondary | ICD-10-CM | POA: Diagnosis not present

## 2020-11-06 DIAGNOSIS — E785 Hyperlipidemia, unspecified: Secondary | ICD-10-CM | POA: Diagnosis not present

## 2020-11-06 DIAGNOSIS — K08409 Partial loss of teeth, unspecified cause, unspecified class: Secondary | ICD-10-CM | POA: Diagnosis not present

## 2020-11-18 NOTE — Progress Notes (Signed)
Cardiology Office Note  Date: 11/19/2020   ID: Gloria Sanchez Gloria Sanchez 1941-02-15, MRN [id number]  PCP:  Estanislado Pandy, MD  Cardiologist:  Dina Rich, MD Electrophysiologist:  None   Chief Complaint: 82-month follow-up  History of Present Illness: Gloria Sanchez is a 80 y.o. female with a history of DM2, diverticulosis, HTN, HLD, GERD.  She was last seen by Dr. Wyline Mood on 05/01/2020 for chest pain.  She had had an admission in August 2021 with chest pain to [facility] but ruled out for MI with negative nuclear stress test and negative cardiac enzymes.  Echocardiogram demonstrating EF greater than 70%, G1 DD.  Nuclear stress test no ischemia and considered low risk.  She had 1 episode of severe chest pain leading to an admission.  During clinic visit she had no recent chest pains, shortness of breath, or DOE.  No further cardiac work-up was warranted per Dr. Wyline Mood.  He mentioned she may follow-up as needed.  She is here today for 87-month follow-up.  She is accompanied by her son.  She states she has been having issues with shortness of breath/DOE.  She states this has been occurring for quite some time but appears to be getting worse over the last few months.  Also complaining of chest pressure/chest tightness describing it as if something is sitting on her chest.  He states it can occur at rest or with exertion.  She denies any radiation or associated symptoms.  She does have a history of GERD.  Blood pressure is well controlled today at 128/72.  She denies any PND, orthopnea,.  She states she has gained some weight.  Last weight recorded was 197 on 09/19/2020.  Weight today is 199.  She denies any lower extremity edema.  Denies any palpitations or arrhythmias.  Denies any bleeding.  Past Medical History:  Diagnosis Date   Allergic rhinitis    Arthritis    Common bile duct dilation 05/22/2016   Diverticulosis    DM (diabetes mellitus) (HCC)    Essential hypertension 05/22/2016    GERD (gastroesophageal reflux disease)    High cholesterol 05/22/2016   Hyperlipidemia    Hypertension    Restless leg syndrome     Past Surgical History:  Procedure Laterality Date   ANTERIOR AND POSTERIOR REPAIR N/A 12/29/2016   Procedure: ANTERIOR (CYSTOCELE) AND POSTERIOR REPAIR (RECTOCELE);  Surgeon: Tilda Burrow, MD;  Location: [facility];  Service: Gynecology;  Laterality: N/A;   CHOLECYSTECTOMY     complete hysterectomy     ERCP W/ SPHICTEROTOMY  1974   REPLACEMENT TOTAL KNEE     TOTAL KNEE ARTHROPLASTY Left 05/10/2018   Procedure: TOTAL KNEE ARTHROPLASTY;  Surgeon: Vickki Hearing, MD;  Location: [facility];  Service: Orthopedics;  Laterality: Left;    Current Outpatient Medications  Medication Sig Dispense Refill   amLODipine (NORVASC) 5 MG tablet Take 1 tablet (5 mg total) by mouth daily. 30 tablet 0   aspirin EC 81 MG tablet Take 81 mg by mouth daily. Swallow whole.     calcium carbonate (TUMS - DOSED IN MG ELEMENTAL CALCIUM) 500 MG chewable tablet Chew 1 tablet by mouth 3 (three) times daily as needed for indigestion or heartburn.     clonazePAM (KLONOPIN) 0.5 MG tablet Take 0.5 tablets (0.25 mg total) by mouth at bedtime as needed for anxiety. 5 tablet 0   docusate sodium (COLACE) 100 MG capsule Take 1 capsule (100 mg total) by mouth 2 (  two) times daily. 10 capsule 0   gabapentin (NEURONTIN) 300 MG capsule Take 1 capsule (300 mg total) by mouth 3 (three) times daily. 90 capsule 1   hydrochlorothiazide (HYDRODIURIL) 25 MG tablet Take 1 tablet (25 mg total) by mouth daily. 30 tablet 0   metFORMIN (GLUCOPHAGE-XR) 500 MG 24 hr tablet Take 1 tablet (500 mg total) by mouth at bedtime. 30 tablet 0   methocarbamol (ROBAXIN) 500 MG tablet TAKE 1 TABLET BY MOUTH EVERY 6 HOURS AS NEEDED FOR MUSCLE SPASMS. 120 tablet 0   nitroGLYCERIN (NITROSTAT) 0.4 MG SL tablet Place 1 tablet under the tongue every 5 (five) minutes x 3 doses as needed.     NON FORMULARY Diet Type:  NAS      ondansetron (ZOFRAN) 4 MG tablet Take 1 tablet (4 mg total) by mouth every 8 (eight) hours as needed for nausea or vomiting. 20 tablet 0   pantoprazole (PROTONIX) 40 MG tablet Take 1 tablet (40 mg total) by mouth daily. 30 tablet 0   sertraline (ZOLOFT) 50 MG tablet Take 50 mg by mouth daily.     simvastatin (ZOCOR) 20 MG tablet Take 1 tablet (20 mg total) by mouth at bedtime. 30 tablet 0   valsartan (DIOVAN) 320 MG tablet Take 320 mg by mouth daily.     No current facility-administered medications for this visit.   Allergies:  Patient has no known allergies.   Social History: The patient  reports that she has never smoked. She has never used smokeless tobacco. She reports that she does not drink alcohol and does not use drugs.   Family History: The patient's family history includes Breast cancer in her sister; COPD in her brother; Dementia in her mother; Diabetes in her sister and son; Emphysema in her father; Heart attack in her brother; Hypertension in her mother.   ROS:  Please see the history of present illness. Otherwise, complete review of systems is positive for none.  All other systems are reviewed and negative.   Physical Exam: VS:  BP 128/72   Pulse 76   Ht 5\' 2"  (1.575 m)   Wt 199 lb 9.6 oz (90.5 kg)   SpO2 93%   BMI 36.51 kg/m , BMI Body mass index is 36.51 kg/m.  Wt Readings from Last 3 Encounters:  11/19/20 199 lb 9.6 oz (90.5 kg)  09/19/20 197 lb (89.4 kg)  08/19/20 194 lb (88 kg)    General: Obese patient appears comfortable at rest. Neck: Supple, no elevated JVP or carotid bruits, no thyromegaly. Lungs: Clear to auscultation, nonlabored breathing at rest. Cardiac: Regular rate and rhythm, no S3 or significant systolic murmur, no pericardial rub. Extremities: No pitting edema, distal pulses 2+. Skin: Warm and dry. Musculoskeletal: No kyphosis. Neuropsychiatric: Alert and oriented x3, affect grossly appropriate.  ECG: 11/19/2020 EKG normal sinus rhythm with  sinus arrhythmia rate of 72.  Recent Labwork: No results found for requested labs within last 8760 hours.  No results found for: CHOL, TRIG, HDL, CHOLHDL, VLDL, LDLCALC, LDLDIRECT  Other Studies Reviewed Today:   Echocardiogram 11/07/2019 Summary    1. Technically difficult study.    2. The left ventricle is normal in size with moderately increased wall  thickness.    3. The left ventricular systolic function is hyperdynamic, LVEF is visually  estimated at >70%.    4. There is grade I diastolic dysfunction (impaired relaxation).    5. The aortic valve is trileaflet with mildly thickened leaflets with normal  excursion.  6. The left atrium is moderately dilated in size.    7. The right ventricle is normal in size, with normal systolic function.    NST 11/07/2019 Impression  1. No reversible ischemia or infarction.   2. Normal left ventricular wall motion.   3. Left ventricular ejection fraction 63%   4. Non invasive risk stratification*: Low   *2012 Appropriate Use Criteria for Coronary Revascularization  Focused Update: J Am Coll Cardiol. 2012;59(9):857-881.  http://content.dementiazones.com.aspx?articleid=[email or web address]     Assessment and Plan:  1. Other chest pain   2. Essential hypertension   3. Mixed hyperlipidemia    1. Other chest pain Complaining of chest tightness/chest pressure occurring with and without exertion.  She states at times it feels like something is laying heart in her chest.  She denies any radiation to neck, arm, back, jaw.  Denies any associated nausea, vomiting, or diaphoresis.  EKG today normal sinus rhythm with sinus arrhythmia rate of 72.  No ST or T wave changes noted.  Continue aspirin 81 mg daily, sublingual nitroglycerin as needed, please get a Lexiscan stress test.  2. Essential hypertension Blood pressure well controlled today at 128/72.  Continue amlodipine 5 mg daily, HCTZ 25 mg daily.  Valsartan 320 mg daily.    3. Mixed  hyperlipidemia Continue simvastatin 20 mg daily.  Patient states she recently had lab work at Dr. Dian Situ office in August and all of her labs were within normal limits.  4.  SOB/DOE Complaining of SOB/DOE.  She states it appears to have been progressing over the last few months.  Please get an echocardiogram.  Medication Adjustments/Labs and Tests Ordered: Current medicines are reviewed at length with the patient today.  Concerns regarding medicines are outlined above.   Disposition: Follow-up with Dr. Wyline Mood or APP 3 months  Signed, Rennis Harding, NP 11/19/2020 10:23 AM    [facility] [address] [place], [place], [place] [address] Phone: [phone]; Fax: [phone]

## 2020-11-19 ENCOUNTER — Encounter: Payer: Self-pay | Admitting: *Deleted

## 2020-11-19 ENCOUNTER — Encounter: Payer: Self-pay | Admitting: Family Medicine

## 2020-11-19 ENCOUNTER — Ambulatory Visit: Payer: Medicare PPO | Admitting: Family Medicine

## 2020-11-19 VITALS — BP 128/72 | HR 76 | Ht 62.0 in | Wt 199.6 lb

## 2020-11-19 DIAGNOSIS — I10 Essential (primary) hypertension: Secondary | ICD-10-CM

## 2020-11-19 DIAGNOSIS — R0789 Other chest pain: Secondary | ICD-10-CM

## 2020-11-19 DIAGNOSIS — R0602 Shortness of breath: Secondary | ICD-10-CM | POA: Diagnosis not present

## 2020-11-19 DIAGNOSIS — E782 Mixed hyperlipidemia: Secondary | ICD-10-CM

## 2020-11-19 NOTE — Patient Instructions (Addendum)
Medication Instructions:  Continue all current medications.  Labwork: none  Testing/Procedures:  Your physician has requested that you have an echocardiogram. Echocardiography is a painless test that uses sound waves to create images of your heart. It provides your doctor with information about the size and shape of your heart and how well your heart's chambers and valves are working. This procedure takes approximately one hour. There are no restrictions for this procedure.  Your physician has requested that you have a lexiscan myoview. For further information please visit www.cardiosmart.org. Please follow instruction sheet, as given.  Office will contact with results via phone or letter.    Follow-Up: 3 months   Any Other Special Instructions Will Be Listed Below (If Applicable).  If you need a refill on your cardiac medications before your next appointment, please call your pharmacy.  

## 2020-11-26 DIAGNOSIS — I10 Essential (primary) hypertension: Secondary | ICD-10-CM | POA: Diagnosis not present

## 2020-11-26 DIAGNOSIS — M5412 Radiculopathy, cervical region: Secondary | ICD-10-CM | POA: Diagnosis not present

## 2020-11-26 DIAGNOSIS — Z6837 Body mass index (BMI) 37.0-37.9, adult: Secondary | ICD-10-CM | POA: Diagnosis not present

## 2020-12-04 DIAGNOSIS — M5412 Radiculopathy, cervical region: Secondary | ICD-10-CM | POA: Diagnosis not present

## 2020-12-13 ENCOUNTER — Encounter (HOSPITAL_COMMUNITY)
Admission: RE | Admit: 2020-12-13 | Discharge: 2020-12-13 | Disposition: A | Discharge status: Home / Self Care | Payer: Medicare PPO | Source: Ambulatory Visit | Attending: Family Medicine | Admitting: Family Medicine

## 2020-12-13 ENCOUNTER — Ambulatory Visit (HOSPITAL_COMMUNITY)
Admission: RE | Admit: 2020-12-13 | Discharge: 2020-12-13 | Disposition: A | Discharge status: Home / Self Care | Payer: Medicare PPO | Source: Ambulatory Visit | Attending: Family Medicine | Admitting: Family Medicine

## 2020-12-13 ENCOUNTER — Other Ambulatory Visit: Payer: Self-pay

## 2020-12-13 ENCOUNTER — Encounter (HOSPITAL_COMMUNITY): Payer: Self-pay

## 2020-12-13 DIAGNOSIS — R0602 Shortness of breath: Secondary | ICD-10-CM

## 2020-12-13 DIAGNOSIS — R0789 Other chest pain: Secondary | ICD-10-CM | POA: Insufficient documentation

## 2020-12-13 LAB — ECHOCARDIOGRAM COMPLETE
AR max vel: 2.3 cm2
AV Area VTI: 2.12 cm2
AV Area mean vel: 2.48 cm2
AV Mean grad: 3 mmHg
AV Peak grad: 7 mmHg
Ao pk vel: 1.32 m/s
Area-P 1/2: 2.53 cm2
MV VTI: 2.07 cm2
S' Lateral: 2.7 cm

## 2020-12-13 LAB — NM MYOCAR MULTI W/SPECT W/WALL MOTION / EF
LV dias vol: 65 mL (ref 46–106)
LV sys vol: 24 mL
Nuc Stress EF: 63 %
Peak HR: 96 {beats}/min
RATE: 0.5
Rest HR: 72 {beats}/min
Rest Nuclear Isotope Dose: 11 mCi
SDS: 1
SRS: 2
SSS: 3
ST Depression (mm): 0 mm
Stress Nuclear Isotope Dose: 31 mCi

## 2020-12-13 MED ORDER — TECHNETIUM TC 99M TETROFOSMIN IV KIT
10.0000 | PACK | Freq: Once | INTRAVENOUS | Status: AC | PRN
  Administered 2020-12-13: 11 via INTRAVENOUS

## 2020-12-13 MED ORDER — SODIUM CHLORIDE FLUSH 0.9 % IV SOLN
INTRAVENOUS | Status: AC
  Administered 2020-12-13: 10 mL via INTRAVENOUS
  Filled 2020-12-13: qty 10

## 2020-12-13 MED ORDER — TECHNETIUM TC 99M TETROFOSMIN IV KIT
30.0000 | PACK | Freq: Once | INTRAVENOUS | Status: AC | PRN
  Administered 2020-12-13: 31 via INTRAVENOUS

## 2020-12-13 MED ORDER — REGADENOSON 0.4 MG/5ML IV SOLN
INTRAVENOUS | Status: AC
  Administered 2020-12-13: 0.4 mg via INTRAVENOUS
  Filled 2020-12-13: qty 5

## 2020-12-17 ENCOUNTER — Telehealth: Payer: Self-pay | Admitting: *Deleted

## 2020-12-17 NOTE — Telephone Encounter (Signed)
Lesle Chris, LPN  2/95/1884  3:30 PM EDT Back to Top    Notified, copy to pcp.

## 2020-12-17 NOTE — Telephone Encounter (Signed)
STRESS TEST -    Stress test was considered low risk per the interpreting physician.   Netta Neat, NP   ECHO -    Echocardiogram shows good pumping function of the heart. There is significantly increased size of the muscle of the main pumping chamber of the heart. This can contribute to increased shortness of breath. Best management is the keep blood pressure at or below 130/80 and manage all other risk factors.  There is a very mild leak in the mital valve. This is common aging.   ANDREW Benjamine Mola, NP

## 2021-02-13 DIAGNOSIS — Z6837 Body mass index (BMI) 37.0-37.9, adult: Secondary | ICD-10-CM | POA: Diagnosis not present

## 2021-02-13 DIAGNOSIS — J309 Allergic rhinitis, unspecified: Secondary | ICD-10-CM | POA: Diagnosis not present

## 2021-02-13 DIAGNOSIS — L03119 Cellulitis of unspecified part of limb: Secondary | ICD-10-CM | POA: Diagnosis not present

## 2021-02-13 DIAGNOSIS — Z23 Encounter for immunization: Secondary | ICD-10-CM | POA: Diagnosis not present

## 2021-02-24 DIAGNOSIS — E1140 Type 2 diabetes mellitus with diabetic neuropathy, unspecified: Secondary | ICD-10-CM | POA: Diagnosis not present

## 2021-02-24 DIAGNOSIS — E7800 Pure hypercholesterolemia, unspecified: Secondary | ICD-10-CM | POA: Diagnosis not present

## 2021-02-24 DIAGNOSIS — E7849 Other hyperlipidemia: Secondary | ICD-10-CM | POA: Diagnosis not present

## 2021-02-24 DIAGNOSIS — E1169 Type 2 diabetes mellitus with other specified complication: Secondary | ICD-10-CM | POA: Diagnosis not present

## 2021-02-24 DIAGNOSIS — K2100 Gastro-esophageal reflux disease with esophagitis, without bleeding: Secondary | ICD-10-CM | POA: Diagnosis not present

## 2021-02-24 DIAGNOSIS — E782 Mixed hyperlipidemia: Secondary | ICD-10-CM | POA: Diagnosis not present

## 2021-02-24 DIAGNOSIS — E7801 Familial hypercholesterolemia: Secondary | ICD-10-CM | POA: Diagnosis not present

## 2021-02-24 DIAGNOSIS — N183 Chronic kidney disease, stage 3 unspecified: Secondary | ICD-10-CM | POA: Diagnosis not present

## 2021-02-25 ENCOUNTER — Encounter: Payer: Self-pay | Admitting: Cardiology

## 2021-02-25 ENCOUNTER — Ambulatory Visit: Payer: Medicare PPO | Admitting: Cardiology

## 2021-02-25 VITALS — BP 128/70 | HR 72 | Ht 60.0 in | Wt 200.0 lb

## 2021-02-25 DIAGNOSIS — R0602 Shortness of breath: Secondary | ICD-10-CM

## 2021-02-25 DIAGNOSIS — R0789 Other chest pain: Secondary | ICD-10-CM

## 2021-02-25 NOTE — Progress Notes (Signed)
Clinical Summary Gloria Sanchez is a 80 y.o.female seen today for follow up of the following medical problems.      1. Chest pain/SOB - admit 10/2019 with chest pain to [facility] - ruled out for MI with enzymes. Nuclear stress was negative   10/2019 echo LVEF >70%, grade I diastolic dysfunction 10/2019 Nuclear stress: no ischemia, low risk  11/2020 nuclear stress: no clear ischemia, likely breast attenuation 11/2020 echo: LVEF 60-65%, no WMAs, severe asymmetric septal hypertrophy,      - SOB is ongoing. +coughing, wheezing. 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      Past Medical History:  Diagnosis Date   Allergic rhinitis    Arthritis    Common bile duct dilation 05/22/2016   Diverticulosis    DM (diabetes mellitus) (HCC)    Essential hypertension 05/22/2016   GERD (gastroesophageal reflux disease)    High cholesterol 05/22/2016   Hyperlipidemia    Hypertension    Restless leg syndrome      No Known Allergies   Current Outpatient Medications  Medication Sig Dispense Refill   amLODipine (NORVASC) 5 MG tablet Take 1 tablet (5 mg total) by mouth daily. 30 tablet 0   aspirin EC 81 MG tablet Take 81 mg by mouth daily. Swallow whole.     calcium carbonate (TUMS - DOSED IN MG ELEMENTAL CALCIUM) 500 MG chewable tablet Chew 1 tablet by mouth 3 (three) times daily as needed for indigestion or heartburn.     clonazePAM (KLONOPIN) 0.5 MG tablet Take 0.5 tablets (0.25 mg total) by mouth at bedtime as needed for anxiety. 5 tablet 0   docusate sodium (COLACE) 100 MG capsule Take 1 capsule (100 mg total) by mouth 2 (two) times daily. 10 capsule 0   gabapentin (NEURONTIN) 300 MG capsule Take 1 capsule (300 mg total) by mouth 3 (three) times daily. 90 capsule 1   hydrochlorothiazide (HYDRODIURIL) 25 MG tablet Take 1 tablet (25 mg total) by mouth daily. 30 tablet 0   metFORMIN (GLUCOPHAGE-XR) 500 MG 24 hr tablet  Take 1 tablet (500 mg total) by mouth at bedtime. 30 tablet 0   methocarbamol (ROBAXIN) 500 MG tablet TAKE 1 TABLET BY MOUTH EVERY 6 HOURS AS NEEDED FOR MUSCLE SPASMS. 120 tablet 0   nitroGLYCERIN (NITROSTAT) 0.4 MG SL tablet Place 1 tablet under the tongue every 5 (five) minutes x 3 doses as needed.     NON FORMULARY Diet Type:  NAS     ondansetron (ZOFRAN) 4 MG tablet Take 1 tablet (4 mg total) by mouth every 8 (eight) hours as needed for nausea or vomiting. 20 tablet 0   pantoprazole (PROTONIX) 40 MG tablet Take 1 tablet (40 mg total) by mouth daily. 30 tablet 0   sertraline (ZOLOFT) 50 MG tablet Take 50 mg by mouth daily.     simvastatin (ZOCOR) 20 MG tablet Take 1 tablet (20 mg total) by mouth at bedtime. 30 tablet 0   valsartan (DIOVAN) 320 MG tablet Take 320 mg by mouth daily.     No current facility-administered medications for this visit.     Past Surgical History:  Procedure Laterality Date   ANTERIOR AND POSTERIOR REPAIR N/A 12/29/2016   Procedure: ANTERIOR (CYSTOCELE) AND POSTERIOR REPAIR (RECTOCELE);  Surgeon: Tilda Burrow, MD;  Location: [facility];  Service: Gynecology;  Laterality: N/A;   CHOLECYSTECTOMY     complete hysterectomy  ERCP W/ SPHICTEROTOMY  1974   REPLACEMENT TOTAL KNEE     TOTAL KNEE ARTHROPLASTY Left 05/10/2018   Procedure: TOTAL KNEE ARTHROPLASTY;  Surgeon: Vickki Hearing, MD;  Location: [facility];  Service: Orthopedics;  Laterality: Left;     No Known Allergies    Family History  Problem Relation Age of Onset   Diabetes Sister    Emphysema Father    Dementia Mother    Hypertension Mother    COPD Brother    Heart attack Brother    Breast cancer Sister    Diabetes Son      Social History Gloria Sanchez reports that she has never smoked. She has never used smokeless tobacco. Gloria Sanchez reports no history of alcohol use.   Review of Systems CONSTITUTIONAL: No weight loss, fever, chills, weakness or fatigue.  HEENT: Eyes: No  visual loss, blurred vision, double vision or yellow sclerae.No hearing loss, sneezing, congestion, runny nose or sore throat.  SKIN: No rash or itching.  CARDIOVASCULAR: per hpi RESPIRATORY: per hpi GASTROINTESTINAL: No anorexia, nausea, vomiting or diarrhea. No abdominal pain or blood.  GENITOURINARY: No burning on urination, no polyuria NEUROLOGICAL: No headache, dizziness, syncope, paralysis, ataxia, numbness or tingling in the extremities. No change in bowel or bladder control.  MUSCULOSKELETAL: No muscle, back pain, joint pain or stiffness.  LYMPHATICS: No enlarged nodes. No history of splenectomy.  PSYCHIATRIC: No history of depression or anxiety.  ENDOCRINOLOGIC: No reports of sweating, cold or heat intolerance. No polyuria or polydipsia.  Marland Kitchen   Physical Examination Today's Vitals   02/25/21 1329  BP: 128/70  Pulse: 72  SpO2: 96%  Weight: 200 lb (90.7 kg)  Height: 5' (1.524 m)   Body mass index is 39.06 kg/m.  Gen: resting comfortably, no acute distress HEENT: no scleral icterus, pupils equal round and reactive, no palptable cervical adenopathy,  CV: RRR, no m/r/g no jvd Resp: Clear to auscultation bilaterally GI: abdomen is soft, non-tender, non-distended, normal bowel sounds, no hepatosplenomegaly MSK: extremities are warm, no edema.  Skin: warm, no rash Neuro:  no focal deficits Psych: appropriate affect      Assessment and Plan  1. Chest pain/SOB - ongoing SOB, overall benign cardiac workup over the last 2 years - will check PFTs to see if any undelrying pulmonary issue is the cause.       Antoine Poche, M.D.

## 2021-02-25 NOTE — Patient Instructions (Signed)
Medication Instructions:  Continue all current medications.  Labwork: none  Testing/Procedures:  Your physician has recommended that you have a pulmonary function test. Pulmonary Function Tests are a group of tests that measure how well air moves in and out of your lungs.  Office will contact with results via phone or letter.    Follow-Up: Your physician wants you to follow up in: 6 months.  You will receive a reminder letter in the mail one-two months in advance.  If you don't receive a letter, please call our office to schedule the follow up appointment   Any Other Special Instructions Will Be Listed Below (If Applicable).  If you need a refill on your cardiac medications before your next appointment, please call your pharmacy.  

## 2021-02-28 DIAGNOSIS — I10 Essential (primary) hypertension: Secondary | ICD-10-CM | POA: Diagnosis not present

## 2021-02-28 DIAGNOSIS — K2100 Gastro-esophageal reflux disease with esophagitis, without bleeding: Secondary | ICD-10-CM | POA: Diagnosis not present

## 2021-02-28 DIAGNOSIS — R079 Chest pain, unspecified: Secondary | ICD-10-CM | POA: Diagnosis not present

## 2021-02-28 DIAGNOSIS — G2589 Other specified extrapyramidal and movement disorders: Secondary | ICD-10-CM | POA: Diagnosis not present

## 2021-02-28 DIAGNOSIS — E1140 Type 2 diabetes mellitus with diabetic neuropathy, unspecified: Secondary | ICD-10-CM | POA: Diagnosis not present

## 2021-02-28 DIAGNOSIS — F329 Major depressive disorder, single episode, unspecified: Secondary | ICD-10-CM | POA: Diagnosis not present

## 2021-02-28 DIAGNOSIS — N8182 Incompetence or weakening of pubocervical tissue: Secondary | ICD-10-CM | POA: Diagnosis not present

## 2021-02-28 DIAGNOSIS — E7849 Other hyperlipidemia: Secondary | ICD-10-CM | POA: Diagnosis not present

## 2021-04-04 ENCOUNTER — Other Ambulatory Visit (HOSPITAL_COMMUNITY)
Admission: RE | Admit: 2021-04-04 | Discharge: 2021-04-04 | Disposition: A | Discharge status: Home / Self Care | Payer: Medicare PPO | Source: Ambulatory Visit | Attending: Cardiology | Admitting: Cardiology

## 2021-04-04 DIAGNOSIS — E1142 Type 2 diabetes mellitus with diabetic polyneuropathy: Secondary | ICD-10-CM | POA: Diagnosis not present

## 2021-04-04 DIAGNOSIS — G2581 Restless legs syndrome: Secondary | ICD-10-CM | POA: Diagnosis not present

## 2021-04-04 DIAGNOSIS — G8929 Other chronic pain: Secondary | ICD-10-CM | POA: Diagnosis not present

## 2021-04-04 DIAGNOSIS — F419 Anxiety disorder, unspecified: Secondary | ICD-10-CM | POA: Diagnosis not present

## 2021-04-04 DIAGNOSIS — F3342 Major depressive disorder, recurrent, in full remission: Secondary | ICD-10-CM | POA: Diagnosis not present

## 2021-04-04 DIAGNOSIS — K219 Gastro-esophageal reflux disease without esophagitis: Secondary | ICD-10-CM | POA: Diagnosis not present

## 2021-04-04 DIAGNOSIS — I10 Essential (primary) hypertension: Secondary | ICD-10-CM | POA: Diagnosis not present

## 2021-04-04 DIAGNOSIS — Z01818 Encounter for other preprocedural examination: Secondary | ICD-10-CM

## 2021-04-04 DIAGNOSIS — E785 Hyperlipidemia, unspecified: Secondary | ICD-10-CM | POA: Diagnosis not present

## 2021-04-07 ENCOUNTER — Other Ambulatory Visit (HOSPITAL_COMMUNITY)
Admission: RE | Admit: 2021-04-07 | Discharge: 2021-04-07 | Disposition: A | Discharge status: Home / Self Care | Payer: Medicare PPO | Source: Ambulatory Visit | Attending: Cardiology | Admitting: Cardiology

## 2021-04-07 DIAGNOSIS — Z20822 Contact with and (suspected) exposure to covid-19: Secondary | ICD-10-CM | POA: Insufficient documentation

## 2021-04-07 DIAGNOSIS — Z01812 Encounter for preprocedural laboratory examination: Secondary | ICD-10-CM | POA: Diagnosis not present

## 2021-04-07 DIAGNOSIS — Z01818 Encounter for other preprocedural examination: Secondary | ICD-10-CM

## 2021-04-08 ENCOUNTER — Other Ambulatory Visit: Payer: Self-pay

## 2021-04-08 ENCOUNTER — Ambulatory Visit (HOSPITAL_COMMUNITY)
Admission: RE | Admit: 2021-04-08 | Discharge: 2021-04-08 | Disposition: A | Discharge status: Home / Self Care | Payer: Medicare PPO | Source: Ambulatory Visit | Attending: Cardiology | Admitting: Cardiology

## 2021-04-08 ENCOUNTER — Telehealth: Payer: Self-pay | Admitting: Cardiology

## 2021-04-08 DIAGNOSIS — R0602 Shortness of breath: Secondary | ICD-10-CM | POA: Diagnosis not present

## 2021-04-08 LAB — PULMONARY FUNCTION TEST
DL/VA % pred: 16 %
DL/VA: 0.71 ml/min/mmHg/L
DLCO unc % pred: -7 %
DLCO unc: -1.3 ml/min/mmHg
FEF 25-75 Post: 1.86 L/sec
FEF 25-75 Pre: 1.8 L/sec
FEF2575-%Change-Post: 2 %
FEF2575-%Pred-Post: 166 %
FEF2575-%Pred-Pre: 161 %
FEV1-%Change-Post: 19 %
FEV1-%Pred-Post: 110 %
FEV1-%Pred-Pre: 92 %
FEV1-Post: 1.41 L
FEV1-Pre: 1.18 L
FEV1FVC-%Change-Post: 1 %
FEV1FVC-%Pred-Pre: 117 %
FEV6-%Change-Post: 17 %
FEV6-%Pred-Post: 98 %
FEV6-%Pred-Pre: 83 %
FEV6-Post: 1.55 L
FEV6-Pre: 1.32 L
FEV6FVC-%Pred-Post: 105 %
FEV6FVC-%Pred-Pre: 105 %
FVC-%Change-Post: 17 %
FVC-%Pred-Post: 93 %
FVC-%Pred-Pre: 79 %
FVC-Post: 1.55 L
FVC-Pre: 1.32 L
Post FEV1/FVC ratio: 91 %
Post FEV6/FVC ratio: 100 %
Pre FEV1/FVC ratio: 89 %
Pre FEV6/FVC Ratio: 100 %
RV % pred: 118 %
RV: 2.54 L
TLC % pred: 86 %
TLC: 3.85 L

## 2021-04-08 LAB — SARS CORONAVIRUS 2 (TAT 6-24 HRS): SARS Coronavirus 2: NEGATIVE

## 2021-04-08 MED ORDER — ALBUTEROL SULFATE (2.5 MG/3ML) 0.083% IN NEBU
2.5000 mg | INHALATION_SOLUTION | Freq: Once | RESPIRATORY_TRACT | Status: AC
  Administered 2021-04-08: 2.5 mg via RESPIRATORY_TRACT

## 2021-04-08 NOTE — Telephone Encounter (Signed)
Pt had PFT scheduled for today. Calling in regards to remind pt time and place of test.

## 2021-04-08 NOTE — Telephone Encounter (Signed)
Patient states she was returning a call but I dont see notes on her chart. Please advise

## 2021-04-16 ENCOUNTER — Telehealth: Payer: Self-pay | Admitting: *Deleted

## 2021-04-16 NOTE — Telephone Encounter (Signed)
-----   Message from Antoine Poche, MD sent at 04/09/2021  3:49 PM EST ----- Fairly normal breathing tests, no significant abnormal findings  Dominga Ferry MD

## 2021-04-16 NOTE — Telephone Encounter (Signed)
Lesle Chris, LPN  3/57/0177  3:12 PM EST Back to Top    Notified, copy to pcp.

## 2021-04-23 DIAGNOSIS — Z20828 Contact with and (suspected) exposure to other viral communicable diseases: Secondary | ICD-10-CM | POA: Diagnosis not present

## 2021-06-06 DIAGNOSIS — Z1231 Encounter for screening mammogram for malignant neoplasm of breast: Secondary | ICD-10-CM | POA: Diagnosis not present

## 2021-06-27 DIAGNOSIS — R7309 Other abnormal glucose: Secondary | ICD-10-CM | POA: Diagnosis not present

## 2021-06-27 DIAGNOSIS — E7849 Other hyperlipidemia: Secondary | ICD-10-CM | POA: Diagnosis not present

## 2021-06-27 DIAGNOSIS — K2100 Gastro-esophageal reflux disease with esophagitis, without bleeding: Secondary | ICD-10-CM | POA: Diagnosis not present

## 2021-06-27 DIAGNOSIS — E7801 Familial hypercholesterolemia: Secondary | ICD-10-CM | POA: Diagnosis not present

## 2021-06-27 DIAGNOSIS — E1140 Type 2 diabetes mellitus with diabetic neuropathy, unspecified: Secondary | ICD-10-CM | POA: Diagnosis not present

## 2021-06-27 DIAGNOSIS — N183 Chronic kidney disease, stage 3 unspecified: Secondary | ICD-10-CM | POA: Diagnosis not present

## 2021-06-27 DIAGNOSIS — E1169 Type 2 diabetes mellitus with other specified complication: Secondary | ICD-10-CM | POA: Diagnosis not present

## 2021-06-27 DIAGNOSIS — E7800 Pure hypercholesterolemia, unspecified: Secondary | ICD-10-CM | POA: Diagnosis not present

## 2021-06-27 DIAGNOSIS — E782 Mixed hyperlipidemia: Secondary | ICD-10-CM | POA: Diagnosis not present

## 2021-07-02 DIAGNOSIS — I10 Essential (primary) hypertension: Secondary | ICD-10-CM | POA: Diagnosis not present

## 2021-07-02 DIAGNOSIS — E7849 Other hyperlipidemia: Secondary | ICD-10-CM | POA: Diagnosis not present

## 2021-07-02 DIAGNOSIS — R079 Chest pain, unspecified: Secondary | ICD-10-CM | POA: Diagnosis not present

## 2021-07-02 DIAGNOSIS — R432 Parageusia: Secondary | ICD-10-CM | POA: Diagnosis not present

## 2021-07-02 DIAGNOSIS — Z1389 Encounter for screening for other disorder: Secondary | ICD-10-CM | POA: Diagnosis not present

## 2021-07-02 DIAGNOSIS — Z1331 Encounter for screening for depression: Secondary | ICD-10-CM | POA: Diagnosis not present

## 2021-07-02 DIAGNOSIS — E1140 Type 2 diabetes mellitus with diabetic neuropathy, unspecified: Secondary | ICD-10-CM | POA: Diagnosis not present

## 2021-07-02 DIAGNOSIS — N8182 Incompetence or weakening of pubocervical tissue: Secondary | ICD-10-CM | POA: Diagnosis not present

## 2021-07-16 ENCOUNTER — Ambulatory Visit: Payer: Medicare PPO | Admitting: Orthopedic Surgery

## 2021-07-31 ENCOUNTER — Ambulatory Visit: Payer: Medicare PPO | Admitting: Orthopedic Surgery

## 2021-07-31 ENCOUNTER — Ambulatory Visit (INDEPENDENT_AMBULATORY_CARE_PROVIDER_SITE_OTHER): Payer: Medicare PPO

## 2021-07-31 DIAGNOSIS — Z96652 Presence of left artificial knee joint: Secondary | ICD-10-CM

## 2021-07-31 DIAGNOSIS — M1710 Unilateral primary osteoarthritis, unspecified knee: Secondary | ICD-10-CM

## 2021-07-31 NOTE — Progress Notes (Signed)
FOLLOW UP  ? ?Encounter Diagnosis  ?Name Primary?  ? Arthritis of knee Yes  ? ? ? ?Chief Complaint  ?Patient presents with  ? Follow-up  ?  Recheck on left knee, DOS 05-10-18  ? ? ? ?Gloria Sanchez had a left total knee with a [employer] fixed-bearing [employer] 3 years ago she is doing well she is walking with a cane but she lives with both of her knees the other 1 was done in 2005 with a [employer] knee ? ?No complaints with the knees x-ray left knee looks good knee looks stable right knee is also visualized no signs of wear or loosening ? ?Follow-up in 2 years ?

## 2021-07-31 NOTE — Patient Instructions (Signed)
Follow up in 2 years/ xray bilat knee ?

## 2021-08-29 ENCOUNTER — Ambulatory Visit: Payer: Medicare PPO | Admitting: Cardiology

## 2021-10-30 DIAGNOSIS — N183 Chronic kidney disease, stage 3 unspecified: Secondary | ICD-10-CM | POA: Diagnosis not present

## 2021-10-30 DIAGNOSIS — E7849 Other hyperlipidemia: Secondary | ICD-10-CM | POA: Diagnosis not present

## 2021-10-30 DIAGNOSIS — I10 Essential (primary) hypertension: Secondary | ICD-10-CM | POA: Diagnosis not present

## 2021-10-30 DIAGNOSIS — K2100 Gastro-esophageal reflux disease with esophagitis, without bleeding: Secondary | ICD-10-CM | POA: Diagnosis not present

## 2021-10-30 DIAGNOSIS — E119 Type 2 diabetes mellitus without complications: Secondary | ICD-10-CM | POA: Diagnosis not present

## 2021-11-06 DIAGNOSIS — R079 Chest pain, unspecified: Secondary | ICD-10-CM | POA: Diagnosis not present

## 2021-11-06 DIAGNOSIS — F329 Major depressive disorder, single episode, unspecified: Secondary | ICD-10-CM | POA: Diagnosis not present

## 2021-11-06 DIAGNOSIS — E1140 Type 2 diabetes mellitus with diabetic neuropathy, unspecified: Secondary | ICD-10-CM | POA: Diagnosis not present

## 2021-11-06 DIAGNOSIS — G5601 Carpal tunnel syndrome, right upper limb: Secondary | ICD-10-CM | POA: Diagnosis not present

## 2021-11-06 DIAGNOSIS — E7849 Other hyperlipidemia: Secondary | ICD-10-CM | POA: Diagnosis not present

## 2021-11-06 DIAGNOSIS — K2100 Gastro-esophageal reflux disease with esophagitis, without bleeding: Secondary | ICD-10-CM | POA: Diagnosis not present

## 2021-11-06 DIAGNOSIS — G2589 Other specified extrapyramidal and movement disorders: Secondary | ICD-10-CM | POA: Diagnosis not present

## 2021-11-06 DIAGNOSIS — I10 Essential (primary) hypertension: Secondary | ICD-10-CM | POA: Diagnosis not present

## 2021-11-06 DIAGNOSIS — N8182 Incompetence or weakening of pubocervical tissue: Secondary | ICD-10-CM | POA: Diagnosis not present

## 2021-11-11 ENCOUNTER — Ambulatory Visit: Payer: Medicare PPO | Admitting: Cardiology

## 2021-12-18 DIAGNOSIS — Z6832 Body mass index (BMI) 32.0-32.9, adult: Secondary | ICD-10-CM | POA: Diagnosis not present

## 2021-12-18 DIAGNOSIS — Z23 Encounter for immunization: Secondary | ICD-10-CM | POA: Diagnosis not present

## 2022-01-15 DIAGNOSIS — Z23 Encounter for immunization: Secondary | ICD-10-CM | POA: Diagnosis not present

## 2022-03-03 DIAGNOSIS — K2100 Gastro-esophageal reflux disease with esophagitis, without bleeding: Secondary | ICD-10-CM | POA: Diagnosis not present

## 2022-03-03 DIAGNOSIS — I10 Essential (primary) hypertension: Secondary | ICD-10-CM | POA: Diagnosis not present

## 2022-03-03 DIAGNOSIS — E7801 Familial hypercholesterolemia: Secondary | ICD-10-CM | POA: Diagnosis not present

## 2022-03-03 DIAGNOSIS — E119 Type 2 diabetes mellitus without complications: Secondary | ICD-10-CM | POA: Diagnosis not present

## 2022-03-03 DIAGNOSIS — N183 Chronic kidney disease, stage 3 unspecified: Secondary | ICD-10-CM | POA: Diagnosis not present

## 2022-03-03 DIAGNOSIS — E7849 Other hyperlipidemia: Secondary | ICD-10-CM | POA: Diagnosis not present

## 2022-03-09 DIAGNOSIS — Z0001 Encounter for general adult medical examination with abnormal findings: Secondary | ICD-10-CM | POA: Diagnosis not present

## 2022-03-09 DIAGNOSIS — G5601 Carpal tunnel syndrome, right upper limb: Secondary | ICD-10-CM | POA: Diagnosis not present

## 2022-03-09 DIAGNOSIS — E7849 Other hyperlipidemia: Secondary | ICD-10-CM | POA: Diagnosis not present

## 2022-03-09 DIAGNOSIS — R079 Chest pain, unspecified: Secondary | ICD-10-CM | POA: Diagnosis not present

## 2022-03-09 DIAGNOSIS — N8182 Incompetence or weakening of pubocervical tissue: Secondary | ICD-10-CM | POA: Diagnosis not present

## 2022-03-09 DIAGNOSIS — F329 Major depressive disorder, single episode, unspecified: Secondary | ICD-10-CM | POA: Diagnosis not present

## 2022-03-09 DIAGNOSIS — E1140 Type 2 diabetes mellitus with diabetic neuropathy, unspecified: Secondary | ICD-10-CM | POA: Diagnosis not present

## 2022-03-09 DIAGNOSIS — M1712 Unilateral primary osteoarthritis, left knee: Secondary | ICD-10-CM | POA: Diagnosis not present

## 2022-03-09 DIAGNOSIS — I10 Essential (primary) hypertension: Secondary | ICD-10-CM | POA: Diagnosis not present

## 2022-03-16 ENCOUNTER — Ambulatory Visit: Payer: Medicare PPO | Admitting: Orthopedic Surgery

## 2022-03-16 ENCOUNTER — Encounter: Payer: Self-pay | Admitting: Orthopedic Surgery

## 2022-03-16 DIAGNOSIS — M5417 Radiculopathy, lumbosacral region: Secondary | ICD-10-CM

## 2022-03-16 DIAGNOSIS — M545 Low back pain, unspecified: Secondary | ICD-10-CM

## 2022-03-16 MED ORDER — TIZANIDINE HCL 4 MG PO TABS: 4.0000 mg | ORAL_TABLET | Freq: Four times a day (QID) | ORAL | 0 refills | Status: AC | PRN

## 2022-03-16 MED ORDER — IBUPROFEN 400 MG PO TABS: 400.0000 mg | ORAL_TABLET | Freq: Three times a day (TID) | ORAL | 0 refills | Status: DC | PRN

## 2022-03-16 NOTE — Progress Notes (Signed)
FOLLOW UP   Encounter Diagnoses  Name Primary?   Lumbosacral radiculopathy at L5 Yes   Lumbar pain      Chief Complaint  Patient presents with   Back Pain    Getting worse with back pain     Hpi: 81 yo female h/o lumbar disc disease treated with epidural injections comes in with recurrent radicular pain in the right leg and lower back.  There are no bowel or bladder dysfunction symptoms no evidence of cauda equina  No recent weight loss  Patient exam shows that she is ambulatory with a cane she has a slow gait decreased cadence does not seem to be slumped over too much  She has tenderness over his lower back with normal sensation in the right leg normal strength in the right foot negative straight leg raise  Encounter Diagnoses  Name Primary?   Lumbosacral radiculopathy at L5 Yes   Lumbar pain     Recommend epidural injection and medication adjustment  Meds ordered this encounter  Medications   tiZANidine (ZANAFLEX) 4 MG tablet    Sig: Take 1 tablet (4 mg total) by mouth every 6 (six) hours as needed for muscle spasms.    Dispense:  30 tablet    Refill:  0   ibuprofen (ADVIL) 400 MG tablet    Sig: Take 1 tablet (400 mg total) by mouth every 8 (eight) hours as needed.    Dispense:  90 tablet    Refill:  0   Continue Tylenol and gabapentin  Return if no improvement

## 2022-03-16 NOTE — Patient Instructions (Addendum)
We are referring you to [facility] from [facility] address is [address] [place] The phone number is [phone]  The office will call you with an appointment Dr. Alvester Morin  Take tylenol 500 mg every 6 hrs   Take gabapentin  Take tizanidine every 6 hrs   Take ibuprofen 400 mg every 8 hrs

## 2022-03-27 ENCOUNTER — Ambulatory Visit: Payer: Medicare PPO | Attending: Cardiology | Admitting: Cardiology

## 2022-03-27 ENCOUNTER — Encounter: Payer: Self-pay | Admitting: Cardiology

## 2022-03-27 VITALS — BP 130/74 | HR 80 | Ht 62.0 in | Wt 200.6 lb

## 2022-03-27 DIAGNOSIS — R0602 Shortness of breath: Secondary | ICD-10-CM | POA: Diagnosis not present

## 2022-03-27 DIAGNOSIS — I10 Essential (primary) hypertension: Secondary | ICD-10-CM

## 2022-03-27 DIAGNOSIS — R0789 Other chest pain: Secondary | ICD-10-CM

## 2022-03-27 NOTE — Patient Instructions (Addendum)
Medication Instructions:  Your physician has recommended you make the following change in your medication:  Stop nitroglycerin Continue other medications the same  Labwork: none  Testing/Procedures: none  Follow-Up: Your physician recommends that you schedule a follow-up appointment in: as needed  Any Other Special Instructions Will Be Listed Below (If Applicable).  If you need a refill on your cardiac medications before your next appointment, please call your pharmacy.

## 2022-03-27 NOTE — Progress Notes (Signed)
Clinical Summary Gloria Sanchez is a 81 y.o.female seen today for follow up of the following medical problems.      1. Chest pain/SOB - admit 10/2019 with chest pain to [facility] - ruled out for MI with enzymes. Nuclear stress was negative   10/2019 echo LVEF >70%, grade I diastolic dysfunction 10/2019 [facility] Nuclear stress: no ischemia, low risk   11/2020 nuclear stress: no clear ischemia, likely breast attenuation 11/2020 echo: LVEF 60-65%, no WMAs, severe asymmetric septal hypertrophy,    Jan 2023 PFTs: benign    - SOB is ongoing. +coughing, wheezing. 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  - sedentary lifestyle  Pressing like feeling upper chest. Often with laying down. Lasts just a few seconds, 2-3 times per week.   2. HTN -compliant with meds Past Medical History:  Diagnosis Date   Allergic rhinitis    Arthritis    Common bile duct dilation 05/22/2016   Diverticulosis    DM (diabetes mellitus) (HCC)    Essential hypertension 05/22/2016   GERD (gastroesophageal reflux disease)    High cholesterol 05/22/2016   Hyperlipidemia    Hypertension    Restless leg syndrome      No Known Allergies   Current Outpatient Medications  Medication Sig Dispense Refill   amLODipine (NORVASC) 5 MG tablet Take 1 tablet (5 mg total) by mouth daily. 30 tablet 0   aspirin EC 81 MG tablet Take 81 mg by mouth daily. Swallow whole.     calcium carbonate (TUMS - DOSED IN MG ELEMENTAL CALCIUM) 500 MG chewable tablet Chew 1 tablet by mouth 3 (three) times daily as needed for indigestion or heartburn.     clonazePAM (KLONOPIN) 0.5 MG tablet Take 0.5 tablets (0.25 mg total) by mouth at bedtime as needed for anxiety. 5 tablet 0   docusate sodium (COLACE) 100 MG capsule Take 1 capsule (100 mg total) by mouth 2 (two) times daily. 10 capsule 0   gabapentin (NEURONTIN) 300 MG capsule Take 1 capsule (300 mg total) by mouth 3  (three) times daily. 90 capsule 1   hydrochlorothiazide (HYDRODIURIL) 25 MG tablet Take 1 tablet (25 mg total) by mouth daily. 30 tablet 0   ibuprofen (ADVIL) 400 MG tablet Take 1 tablet (400 mg total) by mouth every 8 (eight) hours as needed. 90 tablet 0   metFORMIN (GLUCOPHAGE-XR) 500 MG 24 hr tablet Take 1 tablet (500 mg total) by mouth at bedtime. 30 tablet 0   nitroGLYCERIN (NITROSTAT) 0.4 MG SL tablet Place 1 tablet under the tongue every 5 (five) minutes x 3 doses as needed.     NON FORMULARY Diet Type:  NAS     ondansetron (ZOFRAN) 4 MG tablet Take 1 tablet (4 mg total) by mouth every 8 (eight) hours as needed for nausea or vomiting. 20 tablet 0   pantoprazole (PROTONIX) 40 MG tablet Take 1 tablet (40 mg total) by mouth daily. 30 tablet 0   sertraline (ZOLOFT) 50 MG tablet Take 50 mg by mouth daily.     simvastatin (ZOCOR) 20 MG tablet Take 1 tablet (20 mg total) by mouth at bedtime. 30 tablet 0   tiZANidine (ZANAFLEX) 4 MG tablet Take 1 tablet (4 mg total) by mouth every 6 (six) hours as needed for muscle spasms. 30 tablet 0   valsartan (DIOVAN) 320 MG tablet Take 320 mg by mouth daily.  No current facility-administered medications for this visit.     Past Surgical History:  Procedure Laterality Date   ANTERIOR AND POSTERIOR REPAIR N/A 12/29/2016   Procedure: ANTERIOR (CYSTOCELE) AND POSTERIOR REPAIR (RECTOCELE);  Surgeon: Tilda Burrow, MD;  Location: [facility];  Service: Gynecology;  Laterality: N/A;   CHOLECYSTECTOMY     complete hysterectomy     ERCP W/ SPHICTEROTOMY  1974   REPLACEMENT TOTAL KNEE     TOTAL KNEE ARTHROPLASTY Left 05/10/2018   Procedure: TOTAL KNEE ARTHROPLASTY;  Surgeon: Vickki Hearing, MD;  Location: [facility];  Service: Orthopedics;  Laterality: Left;     No Known Allergies    Family History  Problem Relation Age of Onset   Diabetes Sister    Emphysema Father    Dementia Mother    Hypertension Mother    COPD Brother    Heart attack Brother     Breast cancer Sister    Diabetes Son      Social History Gloria Sanchez reports that she has never smoked. She has never used smokeless tobacco. Gloria Sanchez reports no history of alcohol use.   Review of Systems CONSTITUTIONAL: No weight loss, fever, chills, weakness or fatigue.  HEENT: Eyes: No visual loss, blurred vision, double vision or yellow sclerae.No hearing loss, sneezing, congestion, runny nose or sore throat.  SKIN: No rash or itching.  CARDIOVASCULAR: per hpi RESPIRATORY: per hpi GASTROINTESTINAL: No anorexia, nausea, vomiting or diarrhea. No abdominal pain or blood.  GENITOURINARY: No burning on urination, no polyuria NEUROLOGICAL: No headache, dizziness, syncope, paralysis, ataxia, numbness or tingling in the extremities. No change in bowel or bladder control.  MUSCULOSKELETAL: No muscle, back pain, joint pain or stiffness.  LYMPHATICS: No enlarged nodes. No history of splenectomy.  PSYCHIATRIC: No history of depression or anxiety.  ENDOCRINOLOGIC: No reports of sweating, cold or heat intolerance. No polyuria or polydipsia.  Marland Kitchen   Physical Examination Today's Vitals   03/27/22 1259  BP: 130/74  Pulse: 80  SpO2: 95%  Weight: 200 lb 9.6 oz (91 kg)  Height: 5\' 2"  (1.575 m)   Body mass index is 36.69 kg/m.  Gen: resting comfortably, no acute distress HEENT: no scleral icterus, pupils equal round and reactive, no palptable cervical adenopathy,  CV: RRR, no m/r/g, no jvd Resp: Clear to auscultation bilaterally GI: abdomen is soft, non-tender, non-distended, normal bowel sounds, no hepatosplenomegaly MSK: extremities are warm, no edema.  Skin: warm, no rash Neuro:  no focal deficits Psych: appropriate affect      Assessment and Plan  1. Chest pain/SOB - recent aytpical chest pains - extensive cardiac workup 2021 and 2022 was benign, PFTs were benign - I think symptoms are due to sedentary lifestyle, obesity, deconditioning - no additioanl  testing planned at this time, if change in symptoms could reconsider.  -EKG shows SR, no ischemic changes  2. HTN - at goal, continue current meds   F/u just as needed      2023, M.D.

## 2022-04-01 ENCOUNTER — Ambulatory Visit: Payer: Self-pay

## 2022-04-01 ENCOUNTER — Ambulatory Visit: Payer: Medicare PPO | Admitting: Physical Medicine and Rehabilitation

## 2022-04-01 VITALS — BP 155/80 | HR 73

## 2022-04-01 DIAGNOSIS — M5416 Radiculopathy, lumbar region: Secondary | ICD-10-CM | POA: Diagnosis not present

## 2022-04-01 MED ORDER — METHYLPREDNISOLONE ACETATE 80 MG/ML IJ SUSP
80.0000 mg | Freq: Once | INTRAMUSCULAR | Status: AC
  Administered 2022-04-01: 80 mg

## 2022-04-01 NOTE — Progress Notes (Signed)
Functional Pain Scale - descriptive words and definitions  Distracting (5)    Aware of pain/able to complete some ADL's but limited by pain/sleep is affected and active distractions are only slightly useful. Moderate range order  Average Pain 10   +Driver, -BT, -Dye Allergies.  Lower back pain that goes across the back and radiates down both legs. Walking and standing makes pain worse

## 2022-04-01 NOTE — Patient Instructions (Signed)

## 2022-04-05 NOTE — Progress Notes (Signed)
Gloria Sanchez [place] - 82 y.o. female MRN [id number]  Date of birth: Sep 08, 1940  Office Visit Note: Visit Date: 04/01/2022 PCP: Manon Hilding, MD Referred by: Sasser, Silvestre Moment, MD  Subjective: Chief Complaint  Patient presents with   Lower Back - Pain   HPI:  Gloria Sanchez is a 82 y.o. female who comes in today at the request of Dr. Arther Abbott for planned Right L5-S1 Lumbar Interlaminar epidural steroid injection with fluoroscopic guidance.  The patient has failed conservative care including home exercise, medications, time and activity modification.  This injection will be diagnostic and hopefully therapeutic.  Please see requesting physician notes for further details and justification.   ROS Otherwise per HPI.  Assessment & Plan: Visit Diagnoses:    ICD-10-CM   1. Lumbar radiculopathy  M54.16 XR C-ARM NO REPORT    Epidural Steroid injection    methylPREDNISolone acetate (DEPO-MEDROL) injection 80 mg      Plan: No additional findings.   Meds & Orders:  Meds ordered this encounter  Medications   methylPREDNISolone acetate (DEPO-MEDROL) injection 80 mg    Orders Placed This Encounter  Procedures   XR C-ARM NO REPORT   Epidural Steroid injection    Follow-up: Return for visit to requesting provider as needed.   Procedures: No procedures performed  Lumbar Epidural Steroid Injection - Interlaminar Approach with Fluoroscopic Guidance  Patient: [place]      Date of Birth: Aug 04, 1940 MRN: [id number] PCP: Manon Hilding, MD      Visit Date: 04/01/2022   Universal Protocol:     Consent Given By: the patient  Position: PRONE  Additional Comments: Vital signs were monitored before and after the procedure. Patient was prepped and draped in the usual sterile fashion. The correct patient, procedure, and site was verified.   Injection Procedure Details:   Procedure diagnoses: Lumbar radiculopathy [M54.16]   Meds Administered:  Meds ordered this  encounter  Medications   methylPREDNISolone acetate (DEPO-MEDROL) injection 80 mg     Laterality: Right  Location/Site:  L5-S1  Needle: 4.5 in., 20 ga. Tuohy  Needle Placement: Paramedian epidural  Findings:   -Comments: Excellent flow of contrast into the epidural space.  Procedure Details: Using a paramedian approach from the side mentioned above, the region overlying the inferior lamina was localized under fluoroscopic visualization and the soft tissues overlying this structure were infiltrated with 4 ml. of 1% Lidocaine without Epinephrine. The Tuohy needle was inserted into the epidural space using a paramedian approach.   The epidural space was localized using loss of resistance along with counter oblique bi-planar fluoroscopic views.  After negative aspirate for air, blood, and CSF, a 2 ml. volume of Isovue-250 was injected into the epidural space and the flow of contrast was observed. Radiographs were obtained for documentation purposes.    The injectate was administered into the level noted above.   Additional Comments:  The patient tolerated the procedure well Dressing: 2 x 2 sterile gauze and Band-Aid    Post-procedure details: Patient was observed during the procedure. Post-procedure instructions were reviewed.  Patient left the clinic in stable condition.   Clinical History: EXAM: MRI LUMBAR SPINE WITHOUT CONTRAST   TECHNIQUE: Multiplanar, multisequence MR imaging of the lumbar spine was performed. No intravenous contrast was administered.   COMPARISON:  X-ray 08/19/2020, MRI 11/22/2015   FINDINGS: Segmentation:  Standard.   Alignment: 5 mm grade 1 anterolisthesis L4 on L5. Slight retrolisthesis T12-L1 and L1-L2.  Vertebrae: No fracture, evidence of discitis, or bone lesion. Multilevel discogenic endplate marrow changes.   Conus medullaris and cauda equina: Conus extends to the L1 level. Conus and cauda equina appear normal.   Paraspinal and  other soft tissues: Negative.   Disc levels:   T12-L1: Sagittal sequences only. Circumferential disc bulge and endplate ridging. No evidence of foraminal or canal stenosis.   L1-L2: Circumferential disc bulge with endplate ridging. Minimal bilateral facet arthropathy. Moderate left and mild right foraminal stenosis. No canal stenosis. No significant interval progression from prior.   L2-L3: Scrotal disc bulge with endplate ridging. Minimal bilateral facet arthropathy. Mild bilateral foraminal stenosis. No canal stenosis. No significant interval progression from prior.   L3-L4: Circumferential disc bulge, eccentric to the right. Bilateral facet arthropathy and mild ligamentum flavum buckling. Impress upon the ventral thecal sac without significant canal stenosis. Minimal right subarticular recess stenosis. Mild right foraminal stenosis. No significant interval progression from prior.   L4-L5: Disc uncovering with diffuse disc bulge, eccentric to the right. Severe bilateral facet arthropathy and ligamentum flavum buckling. Moderate to severe canal stenosis with severe right and mild left foraminal stenosis. No significant interval progression from prior.   L5-S1: Disc uncovering with diffuse disc bulge including small left foraminal protrusion. Ligamentum flavum buckling. Bilateral facet arthropathy. No significant foraminal or canal stenosis. No significant interval progression from prior.   IMPRESSION: 1. Multilevel lumbar spondylosis, not significantly progressed compared to prior MRI. 2. Moderate-to-severe canal stenosis with severe right and mild left foraminal stenosis at L4-L5. 3. Moderate left foraminal stenosis at L1-L2.     Electronically Signed   By: Duanne Guess D.O.   On: 09/04/2020 15:48     Objective:  VS:  HT:    WT:   BMI:     BP:(!) 155/80  HR:73bpm  TEMP: ( )  RESP:  Physical Exam Vitals and nursing note reviewed.  Constitutional:       General: She is not in acute distress.    Appearance: Normal appearance. She is obese. She is not ill-appearing.  HENT:     Head: Normocephalic and atraumatic.     Right Ear: External ear normal.     Left Ear: External ear normal.  Eyes:     Extraocular Movements: Extraocular movements intact.  Cardiovascular:     Rate and Rhythm: Normal rate.     Pulses: Normal pulses.  Pulmonary:     Effort: Pulmonary effort is normal. No respiratory distress.  Abdominal:     General: There is no distension.     Palpations: Abdomen is soft.  Musculoskeletal:        General: Tenderness present.     Cervical back: Neck supple.     Right lower leg: No edema.     Left lower leg: No edema.     Comments: Patient has good distal strength with no pain over the greater trochanters.  No clonus or focal weakness.  Skin:    Findings: No erythema, lesion or rash.  Neurological:     General: No focal deficit present.     Mental Status: She is alert and oriented to person, place, and time.     Sensory: No sensory deficit.     Motor: No weakness or abnormal muscle tone.     Coordination: Coordination normal.  Psychiatric:        Mood and Affect: Mood normal.        Behavior: Behavior normal.      Imaging: No  results found.

## 2022-04-05 NOTE — Procedures (Signed)
Lumbar Epidural Steroid Injection - Interlaminar Approach with Fluoroscopic Guidance  Patient: Gloria Sanchez      Date of Birth: Feb 11, 1941 MRN: [id number] PCP: Manon Hilding, MD      Visit Date: 04/01/2022   Universal Protocol:     Consent Given By: the patient  Position: PRONE  Additional Comments: Vital signs were monitored before and after the procedure. Patient was prepped and draped in the usual sterile fashion. The correct patient, procedure, and site was verified.   Injection Procedure Details:   Procedure diagnoses: Lumbar radiculopathy [M54.16]   Meds Administered:  Meds ordered this encounter  Medications   methylPREDNISolone acetate (DEPO-MEDROL) injection 80 mg     Laterality: Right  Location/Site:  L5-S1  Needle: 4.5 in., 20 ga. Tuohy  Needle Placement: Paramedian epidural  Findings:   -Comments: Excellent flow of contrast into the epidural space.  Procedure Details: Using a paramedian approach from the side mentioned above, the region overlying the inferior lamina was localized under fluoroscopic visualization and the soft tissues overlying this structure were infiltrated with 4 ml. of 1% Lidocaine without Epinephrine. The Tuohy needle was inserted into the epidural space using a paramedian approach.   The epidural space was localized using loss of resistance along with counter oblique bi-planar fluoroscopic views.  After negative aspirate for air, blood, and CSF, a 2 ml. volume of Isovue-250 was injected into the epidural space and the flow of contrast was observed. Radiographs were obtained for documentation purposes.    The injectate was administered into the level noted above.   Additional Comments:  The patient tolerated the procedure well Dressing: 2 x 2 sterile gauze and Band-Aid    Post-procedure details: Patient was observed during the procedure. Post-procedure instructions were reviewed.  Patient left the clinic in stable  condition.

## 2022-05-28 ENCOUNTER — Encounter: Payer: Self-pay | Admitting: Radiology

## 2022-06-08 IMAGING — MR MR CERVICAL SPINE W/O CM
5 series · 35 of 48 positions shown · non-contrast
Comparison: No pertinent prior exams available for comparison.

CLINICAL DATA: Cervical radiculopathy at C7 FUD.0F ([id number]-CM).
Additional history provided by scanning technologist: Patient
reports neck pain with right hand numbness for 3 months.

EXAM:
MRI CERVICAL SPINE WITHOUT CONTRAST
TECHNIQUE: Multiplanar, multisequence MR imaging of the cervical spine was
performed. No intravenous contrast was administered.

[Series 5: T2 · sagittal · 3.0mm · 0.69mm/px · 6 of 15 slices shown (1 of 2)]
[im 1/15]
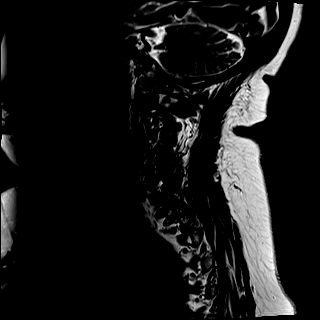
[im 3/15]
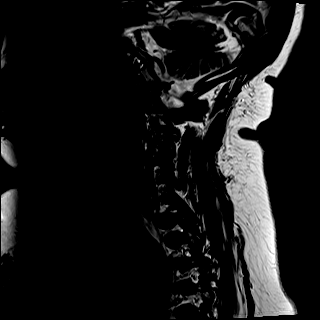
[im 6/15]
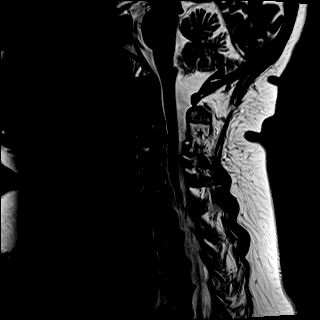
[im 9/15]
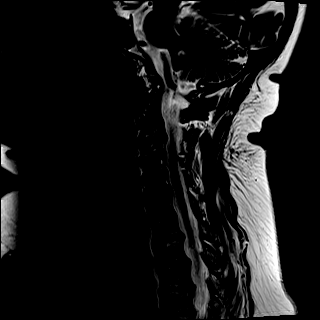
[im 12/15]
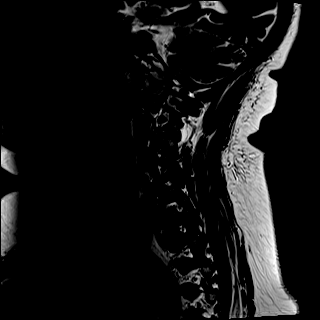
[im 15/15]
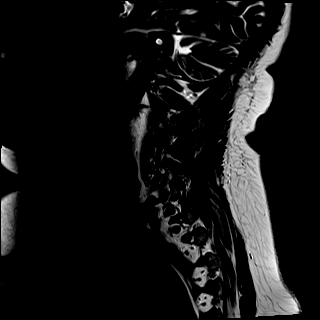

[Series 6: T1 · sagittal · 3.0mm · 0.86mm/px · 7 of 15 slices shown]
[im 1/15]
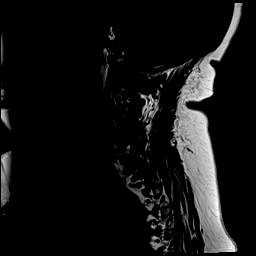
[im 3/15]
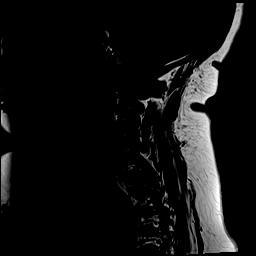
[im 5/15]
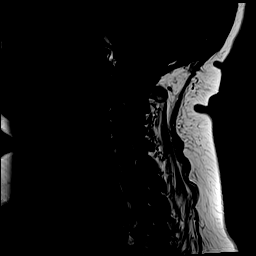
[im 8/15]
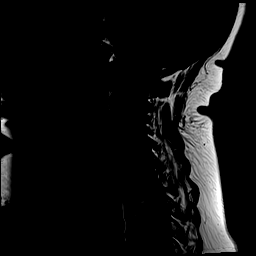
[im 10/15]
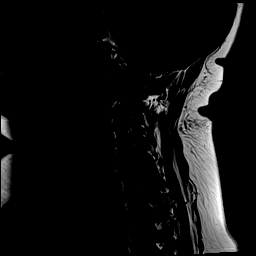
[im 12/15]
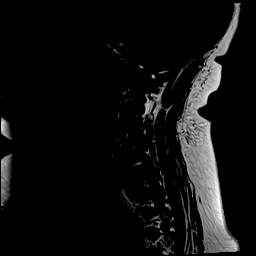
[im 15/15]
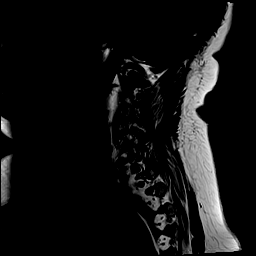

[Series 7: STIR · sagittal · 3.0mm · 0.69mm/px · 7 of 15 slices shown]
[im 1/15]
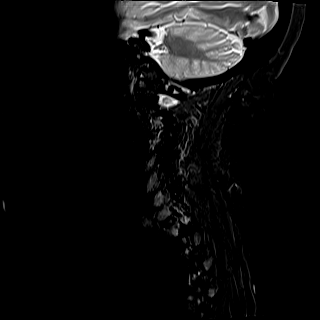
[im 3/15]
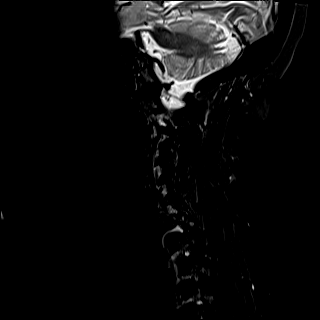
[im 5/15]
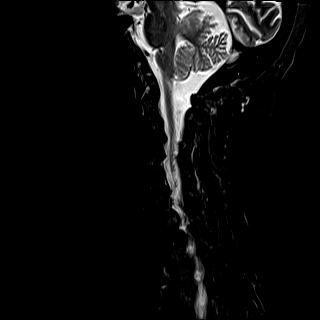
[im 8/15]
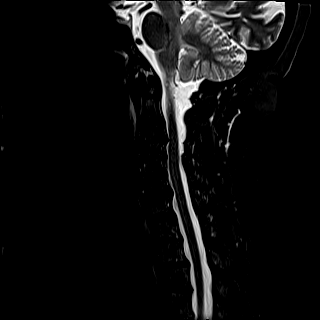
[im 10/15]
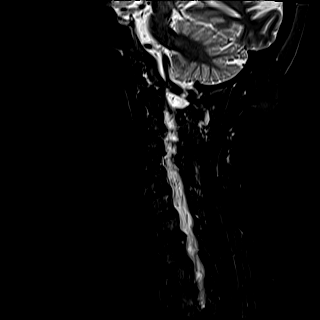
[im 12/15]
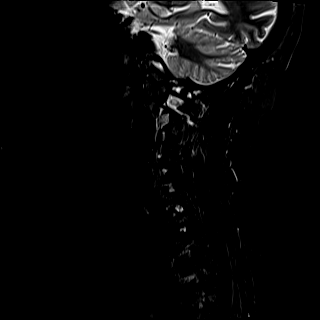
[im 15/15]
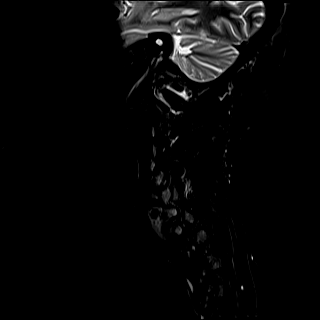

[Series 8: T2 · axial · 3.0mm · 0.70mm/px · z∈[-130,-31]mm · 8 of 32 slices shown (2 of 2)]
[im 1/32]
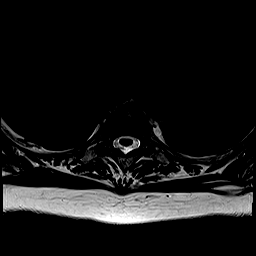
[im 5/32]
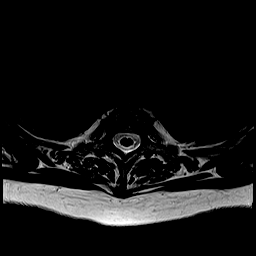
[im 10/32]
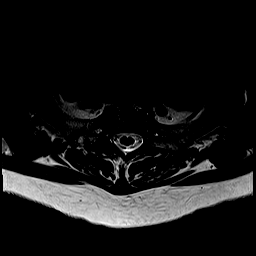
[im 15/32]
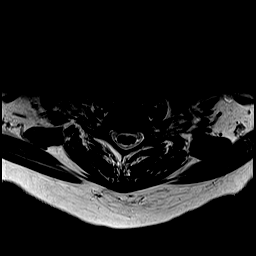
[im 17/32]
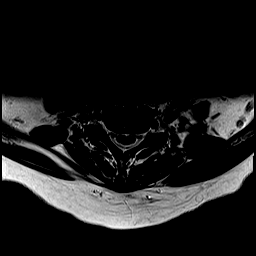
[im 22/32]
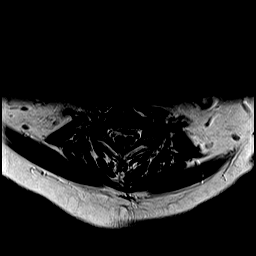
[im 27/32]
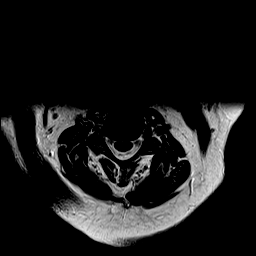
[im 32/32]
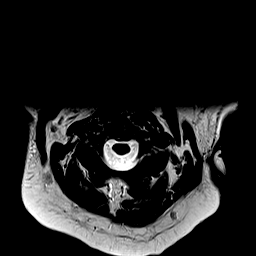

[Series 9: GRE · axial · 3.0mm · 0.35mm/px · z∈[-130,-47]mm · 7 of 32 slices shown]
[im 1/32]
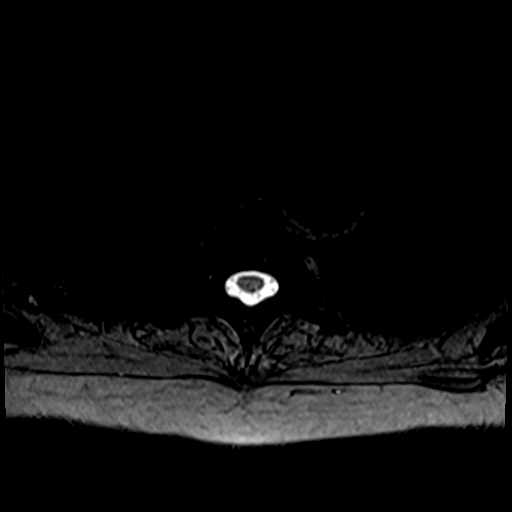
[im 5/32]
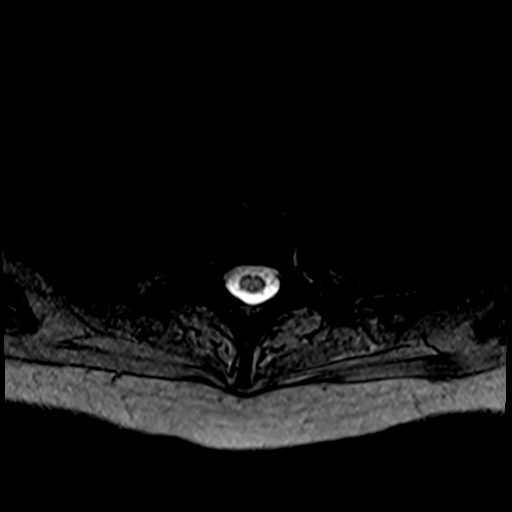
[im 10/32]
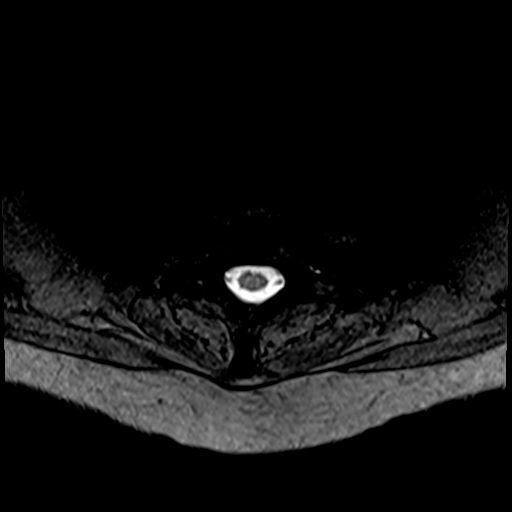
[im 15/32]
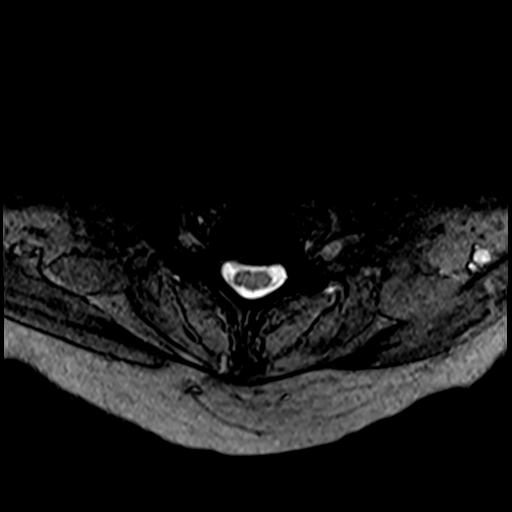
[im 17/32]
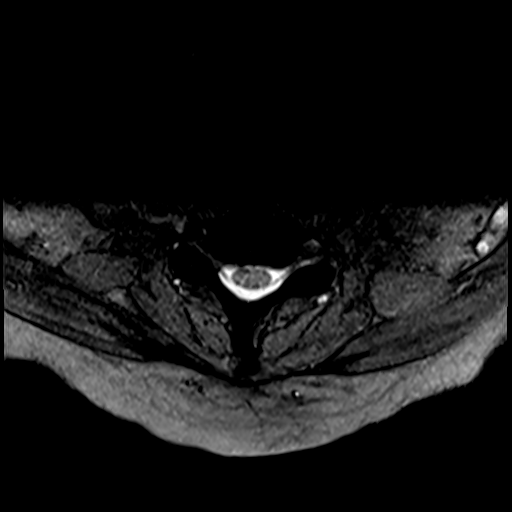
[im 22/32]
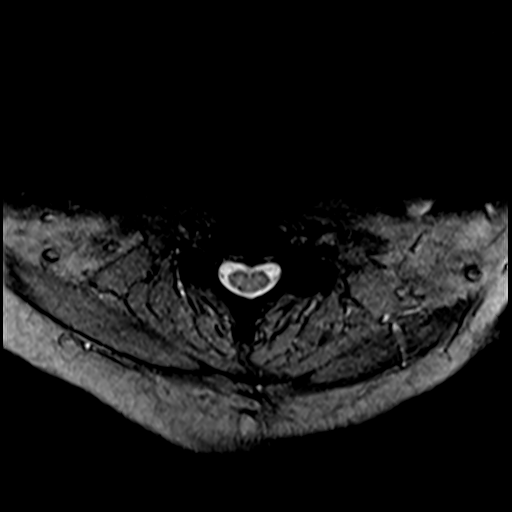
[im 27/32]
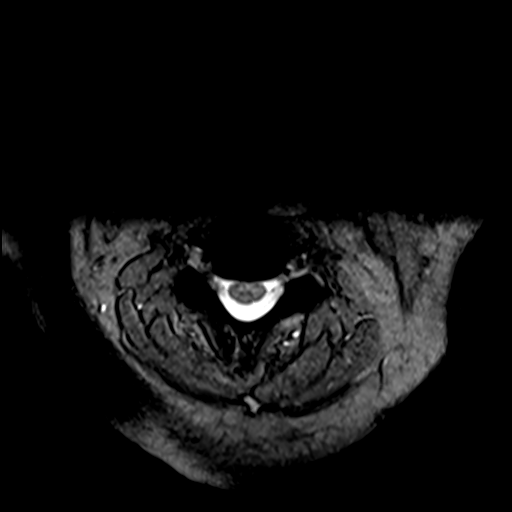

[35 of 48 positions shown; findings below may reference images not displayed]

FINDINGS: Alignment: Straightening of the expected cervical lordosis. 2 mm
C2-C3 grade 1 anterolisthesis. 2 mm C7-T1 grade 1 anterolisthesis. 2
mm T2-T3 grade 1 anterolisthesis.

Vertebrae: Vertebral body height is maintained. Multilevel
degenerative endplate irregularity. Trace degenerative endplate
edema throughout much of the cervical spine. Trace degenerative
endplate edema is also present at T1-T2. Edema within the right
C7-T1 articular pillars, likely degenerative and related to facet
arthrosis. Elsewhere, no significant marrow edema or focal
suspicious osseous lesion is identified.

Cord: No spinal cord signal abnormality is identified.

Posterior Fossa, vertebral arteries, paraspinal tissues: No
abnormality identified within included portions of the posterior
fossa. Flow voids preserved within the imaged cervical vertebral
arteries. Paraspinal soft tissues unremarkable.

Disc levels:

Multilevel disc degeneration. Most notably, there is advanced disc
degeneration at C3-C4, C4-C5 and T1-T2.

C2-C3: 2 mm grade 1 anterolisthesis. Slight disc uncovering. Minimal
facet arthrosis. No significant spinal canal or foraminal stenosis.

C3-C4: Posterior disc osteophyte complex with bilateral disc
osteophyte ridge/uncinate hypertrophy. Mild partial effacement of
ventral thecal sac with contact upon the ventral spinal cord. Mild
right neural foraminal narrowing.

C4-C5: Posterior disc osteophyte complex with bilateral disc
osteophyte ridge/uncinate hypertrophy. Minimal relative spinal canal
narrowing without spinal cord mass effect. Bilateral neural
foraminal narrowing (moderate to moderately severe right, moderate
left).

C5-C6: Posterior disc osteophyte complex with bilateral disc
osteophyte ridge/uncinate hypertrophy. Mild facet arthrosis. Minimal
partial effacement of the ventral thecal sac without spinal cord
mass effect. No significant foraminal stenosis.

C6-C7: Posterior disc osteophyte complex with bilateral disc
osteophyte ridge/uncinate hypertrophy. Mild partial effacement of
the ventral thecal sac without spinal cord mass effect. Mild right
neural foraminal narrowing.

C7-T1: 2 mm grade 1 anterolisthesis. Disc uncovering. Facet
arthrosis (greater on the right). No significant spinal canal
stenosis. Moderate right neural foraminal narrowing.

T1-T2: Posterior disc osteophyte complex with bilateral disc
osteophyte ridge. Facet arthrosis. Mild partial effacement of the
ventral thecal sac without spinal cord mass effect. Moderate right
neural foraminal narrowing

T2-T3: 2 mm grade 1 anterolisthesis. Disc uncovering and slight disc
bulge. No significant spinal canal or foraminal stenosis.

T3-T4: Shallow disc bulge with endplate spurring. Mild facet
arthrosis. Minimal partial effacement of ventral thecal sac without
spinal cord mass effect. No significant foraminal stenosis.
IMPRESSION: Cervical and upper thoracic spondylosis, as outlined. No more than
mild spinal canal stenosis. Multilevel foraminal stenosis, as
detailed and greatest on the right at C4-C5 (moderate to moderately
advanced), on the right at C7-T1 (moderate) and on the right at
T1-T2 (moderate).

Multilevel disc degeneration, greatest at C3-C4, C4-C5 and T1-T2
(advanced at these levels).

Straightening of the expected cervical lordosis. Mild multilevel
grade 1 spondylolisthesis.

Trace multilevel degenerative endplate edema.

Edema is also present within the right C7-T1 articular pillars,
likely degenerative and related to facet arthrosis.

## 2022-06-11 DIAGNOSIS — Z1231 Encounter for screening mammogram for malignant neoplasm of breast: Secondary | ICD-10-CM | POA: Diagnosis not present

## 2022-07-02 DIAGNOSIS — K2100 Gastro-esophageal reflux disease with esophagitis, without bleeding: Secondary | ICD-10-CM | POA: Diagnosis not present

## 2022-07-02 DIAGNOSIS — E782 Mixed hyperlipidemia: Secondary | ICD-10-CM | POA: Diagnosis not present

## 2022-07-02 DIAGNOSIS — E7800 Pure hypercholesterolemia, unspecified: Secondary | ICD-10-CM | POA: Diagnosis not present

## 2022-07-02 DIAGNOSIS — E7849 Other hyperlipidemia: Secondary | ICD-10-CM | POA: Diagnosis not present

## 2022-07-02 DIAGNOSIS — E1140 Type 2 diabetes mellitus with diabetic neuropathy, unspecified: Secondary | ICD-10-CM | POA: Diagnosis not present

## 2022-07-02 DIAGNOSIS — N183 Chronic kidney disease, stage 3 unspecified: Secondary | ICD-10-CM | POA: Diagnosis not present

## 2022-07-02 DIAGNOSIS — E7801 Familial hypercholesterolemia: Secondary | ICD-10-CM | POA: Diagnosis not present

## 2022-07-02 DIAGNOSIS — E1169 Type 2 diabetes mellitus with other specified complication: Secondary | ICD-10-CM | POA: Diagnosis not present

## 2022-07-15 DIAGNOSIS — E1140 Type 2 diabetes mellitus with diabetic neuropathy, unspecified: Secondary | ICD-10-CM | POA: Diagnosis not present

## 2022-07-15 DIAGNOSIS — K2100 Gastro-esophageal reflux disease with esophagitis, without bleeding: Secondary | ICD-10-CM | POA: Diagnosis not present

## 2022-07-15 DIAGNOSIS — I10 Essential (primary) hypertension: Secondary | ICD-10-CM | POA: Diagnosis not present

## 2022-07-15 DIAGNOSIS — G2589 Other specified extrapyramidal and movement disorders: Secondary | ICD-10-CM | POA: Diagnosis not present

## 2022-07-15 DIAGNOSIS — R079 Chest pain, unspecified: Secondary | ICD-10-CM | POA: Diagnosis not present

## 2022-07-15 DIAGNOSIS — F329 Major depressive disorder, single episode, unspecified: Secondary | ICD-10-CM | POA: Diagnosis not present

## 2022-07-15 DIAGNOSIS — N8182 Incompetence or weakening of pubocervical tissue: Secondary | ICD-10-CM | POA: Diagnosis not present

## 2022-07-15 DIAGNOSIS — E7849 Other hyperlipidemia: Secondary | ICD-10-CM | POA: Diagnosis not present

## 2022-07-15 DIAGNOSIS — M1712 Unilateral primary osteoarthritis, left knee: Secondary | ICD-10-CM | POA: Diagnosis not present

## 2022-08-23 ENCOUNTER — Other Ambulatory Visit: Payer: Self-pay | Admitting: Orthopedic Surgery

## 2022-08-23 DIAGNOSIS — M545 Low back pain, unspecified: Secondary | ICD-10-CM

## 2022-08-23 DIAGNOSIS — M5417 Radiculopathy, lumbosacral region: Secondary | ICD-10-CM

## 2022-11-03 DIAGNOSIS — N183 Chronic kidney disease, stage 3 unspecified: Secondary | ICD-10-CM | POA: Diagnosis not present

## 2022-11-03 DIAGNOSIS — E7801 Familial hypercholesterolemia: Secondary | ICD-10-CM | POA: Diagnosis not present

## 2022-11-03 DIAGNOSIS — E7849 Other hyperlipidemia: Secondary | ICD-10-CM | POA: Diagnosis not present

## 2022-11-03 DIAGNOSIS — E1140 Type 2 diabetes mellitus with diabetic neuropathy, unspecified: Secondary | ICD-10-CM | POA: Diagnosis not present

## 2022-11-07 ENCOUNTER — Encounter (HOSPITAL_COMMUNITY): Payer: Self-pay | Admitting: Emergency Medicine

## 2022-11-07 ENCOUNTER — Other Ambulatory Visit: Payer: Self-pay

## 2022-11-07 ENCOUNTER — Emergency Department (HOSPITAL_COMMUNITY)
Admission: EM | Admit: 2022-11-07 | Discharge: 2022-11-08 | Disposition: A | Discharge status: Home / Self Care | Payer: Medicare PPO | Attending: Emergency Medicine | Admitting: Emergency Medicine

## 2022-11-07 ENCOUNTER — Emergency Department (HOSPITAL_COMMUNITY): Payer: Medicare PPO

## 2022-11-07 DIAGNOSIS — R079 Chest pain, unspecified: Secondary | ICD-10-CM | POA: Insufficient documentation

## 2022-11-07 DIAGNOSIS — I10 Essential (primary) hypertension: Secondary | ICD-10-CM | POA: Insufficient documentation

## 2022-11-07 DIAGNOSIS — Z79899 Other long term (current) drug therapy: Secondary | ICD-10-CM | POA: Insufficient documentation

## 2022-11-07 DIAGNOSIS — Z7982 Long term (current) use of aspirin: Secondary | ICD-10-CM | POA: Insufficient documentation

## 2022-11-07 DIAGNOSIS — R0789 Other chest pain: Secondary | ICD-10-CM | POA: Diagnosis not present

## 2022-11-07 LAB — CBC
HCT: 40.3 % (ref 36.0–46.0)
Hemoglobin: 12.9 g/dL (ref 12.0–15.0)
MCH: 31.3 pg (ref 26.0–34.0)
MCHC: 32 g/dL (ref 30.0–36.0)
MCV: 97.8 fL (ref 80.0–100.0)
Platelets: 262 10*3/uL (ref 150–400)
RBC: 4.12 MIL/uL (ref 3.87–5.11)
RDW: 13.2 % (ref 11.5–15.5)
WBC: 11 10*3/uL — ABNORMAL HIGH (ref 4.0–10.5)
nRBC: 0 % (ref 0.0–0.2)

## 2022-11-07 LAB — BASIC METABOLIC PANEL
Anion gap: 12 (ref 5–15)
BUN: 17 mg/dL (ref 8–23)
CO2: 27 mmol/L (ref 22–32)
Calcium: 9.3 mg/dL (ref 8.9–10.3)
Chloride: 100 mmol/L (ref 98–111)
Creatinine, Ser: 0.91 mg/dL (ref 0.44–1.00)
GFR, Estimated: >60 mL/min (ref >60)
Glucose, Bld: 183 mg/dL — ABNORMAL HIGH (ref 70–99)
Potassium: 3.6 mmol/L (ref 3.5–5.1)
Sodium: 139 mmol/L (ref 135–145)

## 2022-11-07 LAB — TROPONIN I (HIGH SENSITIVITY): Troponin I (High Sensitivity): 3 ng/L (ref <18)

## 2022-11-07 LAB — D-DIMER, QUANTITATIVE: D-Dimer, Quant: 0.75 ug/mL-FEU — ABNORMAL HIGH (ref 0.00–0.50)

## 2022-11-07 NOTE — Discharge Instructions (Addendum)
Thankfully all of your testing has been normal and showed no signs of blood clots pneumonia lung problems or heart problems.  I do want you to follow-up with your family doctor and your cardiologist, this is very important, I want you to come back to the ER for any severe worsening symptoms.

## 2022-11-07 NOTE — ED Triage Notes (Signed)
Pt complains of diarrhea all day today and after lying down to go to bed felt a sudden chest pressure on left side with bilateral hand numbness. Pt also complains of SOB with exertion.

## 2022-11-07 NOTE — ED Provider Notes (Signed)
[place] EMERGENCY DEPARTMENT AT [facility] Provider Note   CSN: [id number] Arrival date & time: 11/07/22  2120     History  Chief Complaint  Patient presents with   Chest Pain    Gloria Sanchez is a 82 y.o. female.   Chest Pain  Patient is a 82 year old female, she has a history of hypertension on amlodipine, she is on hydrochlorothiazide, she also takes valsartan.  She has no prior cardiac history in fact she had a stress test and an echocardiogram that was performed approximately 2 years ago which was unremarkable, she follows with Dr. Wyline Mood as a routine visit.  She recalls that after having knee surgery in the past she had an episode of shortness of breath and has never been the same since and continues to have shortness of breath almost chronically.  She has not been having any fevers or chills but noticed that while she was laying in bed today when she tried to move around in the bed she had pain in her chest which was significant when she tried to move around in the bed and change position and would relieve when she would hold perfectly still.  She did not have any exertional symptoms no swelling of her legs, she has not had coughs or fevers and has no history of pulmonary embolism or heart failure.    Home Medications Prior to Admission medications   Medication Sig Start Date End Date Taking? Authorizing Provider  amLODipine (NORVASC) 5 MG tablet Take 1 tablet (5 mg total) by mouth daily. 05/18/18   Sharee Holster, NP  aspirin EC 81 MG tablet Take 81 mg by mouth daily. Swallow whole.    [provider]  calcium carbonate (TUMS - DOSED IN MG ELEMENTAL CALCIUM) 500 MG chewable tablet Chew 1 tablet by mouth 3 (three) times daily as needed for indigestion or heartburn.    [provider]  clonazePAM (KLONOPIN) 0.5 MG tablet Take 0.5 tablets (0.25 mg total) by mouth at bedtime as needed for anxiety. 05/18/18   Sharee Holster, NP  docusate sodium  (COLACE) 100 MG capsule Take 1 capsule (100 mg total) by mouth 2 (two) times daily. 05/12/18   Vickki Hearing, MD  gabapentin (NEURONTIN) 300 MG capsule Take 1 capsule (300 mg total) by mouth 3 (three) times daily. 07/17/19   Vickki Hearing, MD  hydrochlorothiazide (HYDRODIURIL) 25 MG tablet Take 1 tablet (25 mg total) by mouth daily. 05/18/18   Sharee Holster, NP  ibuprofen (ADVIL) 400 MG tablet TAKE 1 TABLET (400 MG TOTAL) BY MOUTH EVERY 8 HOURS AS NEEDED 08/25/22   Vickki Hearing, MD  metFORMIN (GLUCOPHAGE-XR) 500 MG 24 hr tablet Take 1 tablet (500 mg total) by mouth at bedtime. 05/18/18   Sharee Holster, NP  NON FORMULARY Diet Type:  NAS 05/13/18   [provider]  ondansetron (ZOFRAN) 4 MG tablet Take 1 tablet (4 mg total) by mouth every 8 (eight) hours as needed for nausea or vomiting. 05/25/18   Vickki Hearing, MD  pantoprazole (PROTONIX) 40 MG tablet Take 1 tablet (40 mg total) by mouth daily. 05/18/18   Sharee Holster, NP  sertraline (ZOLOFT) 50 MG tablet Take 50 mg by mouth daily. 03/05/20   [provider]  simvastatin (ZOCOR) 20 MG tablet Take 1 tablet (20 mg total) by mouth at bedtime. 05/18/18   Sharee Holster, NP  tiZANidine (ZANAFLEX) 4 MG tablet Take 1 tablet (  4 mg total) by mouth every 6 (six) hours as needed for muscle spasms. 03/16/22   Vickki Hearing, MD  valsartan (DIOVAN) 320 MG tablet Take 320 mg by mouth daily. 03/30/20   [provider]      Allergies    Patient has no known allergies.    Review of Systems   Review of Systems  Cardiovascular:  Positive for chest pain.  All other systems reviewed and are negative.   Physical Exam Updated Vital Signs BP 139/66   Pulse 83   Temp 98.3 F (36.8 C) (Oral)   Resp 16   Ht 1.575 m (5\' 2" )   Wt 86.2 kg   SpO2 93%   BMI 34.75 kg/m  Physical Exam Vitals and nursing note reviewed.  Constitutional:      General: She is not in acute distress.    Appearance: She is  well-developed.  HENT:     Head: Normocephalic and atraumatic.     Mouth/Throat:     Pharynx: No oropharyngeal exudate.  Eyes:     General: No scleral icterus.       Right eye: No discharge.        Left eye: No discharge.     Conjunctiva/sclera: Conjunctivae normal.     Pupils: Pupils are equal, round, and reactive to light.  Neck:     Thyroid: No thyromegaly.     Vascular: No JVD.  Cardiovascular:     Rate and Rhythm: Normal rate and regular rhythm.     Heart sounds: Normal heart sounds. No murmur heard.    No friction rub. No gallop.  Pulmonary:     Effort: Pulmonary effort is normal. No respiratory distress.     Breath sounds: Normal breath sounds. No wheezing or rales.  Abdominal:     General: Bowel sounds are normal. There is no distension.     Palpations: Abdomen is soft. There is no mass.     Tenderness: There is no abdominal tenderness.  Musculoskeletal:        General: No tenderness. Normal range of motion.     Cervical back: Normal range of motion and neck supple.  Lymphadenopathy:     Cervical: No cervical adenopathy.  Skin:    General: Skin is warm and dry.     Findings: No erythema or rash.  Neurological:     Mental Status: She is alert.     Coordination: Coordination normal.  Psychiatric:        Behavior: Behavior normal.     ED Results / Procedures / Treatments   Labs (all labs ordered are listed, but only abnormal results are displayed) Labs Reviewed  BASIC METABOLIC PANEL - Abnormal; Notable for the following components:      Result Value   Glucose, Bld 183 (*)    All other components within normal limits  CBC - Abnormal; Notable for the following components:   WBC 11.0 (*)    All other components within normal limits  D-DIMER, QUANTITATIVE - Abnormal; Notable for the following components:   D-Dimer, Quant 0.75 (*)    All other components within normal limits  TROPONIN I (HIGH SENSITIVITY)    EKG EKG Interpretation Date/Time:  Saturday  November 07 2022 21:36:44 EDT Ventricular Rate:  94 PR Interval:  216 QRS Duration:  93 QT Interval:  381 QTC Calculation: 477 R Axis:   -36  Text Interpretation: Sinus rhythm Prolonged PR interval Left axis deviation Abnormal R-wave progression, late transition Confirmed  by Eber Hong ([id number]) on 11/07/2022 10:32:43 PM  Radiology DG Chest 2 View  Result Date: 11/07/2022 CLINICAL DATA:  Chest pain EXAM: CHEST - 2 VIEW COMPARISON:  None Available. FINDINGS: Cardiac shadow is within normal limits. Lungs are well aerated bilaterally. No focal infiltrate or effusion is seen. Degenerative changes of the thoracic spine are noted. IMPRESSION: No active cardiopulmonary disease. Electronically Signed   By: Alcide Clever M.D.   On: 11/07/2022 22:11    Procedures Procedures    Medications Ordered in ED Medications - No data to display  ED Course/ Medical Decision Making/ A&P                                 Medical Decision Making Amount and/or Complexity of Data Reviewed Labs: ordered. Radiology: ordered.    This patient presents to the ED for concern of chest pain, this involves an extensive number of treatment options, and is a complaint that carries with it a high risk of complications and morbidity.  The differential diagnosis includes possibly just musculoskeletal chest pain given that she got worse with any movement, the pain in her chest is now completely resolved and she is just left with some residual shortness of breath.   Co morbidities that complicate the patient evaluation  The patient has no history of pulmonary disease, she does have a history of hypertension, she has had a evaluation for coronary disease which was unremarkable in the last 2 years but is concerned that she may have a heart problem given the chest pain this evening.   Additional history obtained:  Additional history obtained from medical record External records from outside source obtained and reviewed  including test and echocardiogram from September 2022   Lab Tests:  I Ordered, and personally interpreted labs.  The pertinent results include: See [facility] metabolic panel and troponin all unremarkable and show no signs of elevation D-dimer was added and based on age criteria the patient has a negative D-dimer test   Imaging Studies ordered:  I ordered imaging studies including chest x-ray I independently visualized and interpreted imaging which showed no acute findings I agree with the radiologist interpretation   Cardiac Monitoring: / EKG:  The patient was maintained on a cardiac monitor.  I personally viewed and interpreted the cardiac monitored which showed an underlying rhythm of: Normal sinus rhythm    Problem List / ED Course / Critical interventions / Medication management  Ultimately this patient had no hypoxia, no tachycardia, no fever, normal blood pressure and a negative workup including x-rays and labs.  The cause of her chest pain seems very unlikely to be cardiac in nature and in fact she has been chest pain-free the majority of her time here.  She is not short of breath at this time, she states that it does come on and off over time and she has had this starting after having knee surgery several years ago.  She has no edema of her legs, no asymmetry of the legs, no history of pulmonary embolism, no signs of pneumonia or pneumothorax on x-ray. The patient was updated on the results and is agreeable to the plan, she will follow-up closely with primary care doctor and cardiology as needed. I have reviewed the patients home medicines and have made adjustments as needed   Social Determinants of Health:  None   Test / Admission - Considered:  Considered admission but the patient  appears extremely well has normal vital signs and was given all of her results, agreeable to return should symptoms worsen         Final Clinical Impression(s) / ED Diagnoses Final  diagnoses:  Chest pain, unspecified type    Rx / DC Orders ED Discharge Orders     None         Eber Hong, MD 11/07/22 2359

## 2022-11-07 NOTE — ED Notes (Signed)
Patient currently in X-ray

## 2022-11-08 NOTE — ED Notes (Signed)
Discharge instructions provided by edp were discussed with pt. Pt verbalized understanding with no additional questions at this time. Pt to go home with family at bedside.

## 2022-11-10 DIAGNOSIS — G2589 Other specified extrapyramidal and movement disorders: Secondary | ICD-10-CM | POA: Diagnosis not present

## 2022-11-10 DIAGNOSIS — I10 Essential (primary) hypertension: Secondary | ICD-10-CM | POA: Diagnosis not present

## 2022-11-10 DIAGNOSIS — E7849 Other hyperlipidemia: Secondary | ICD-10-CM | POA: Diagnosis not present

## 2022-11-10 DIAGNOSIS — N8182 Incompetence or weakening of pubocervical tissue: Secondary | ICD-10-CM | POA: Diagnosis not present

## 2022-11-10 DIAGNOSIS — F329 Major depressive disorder, single episode, unspecified: Secondary | ICD-10-CM | POA: Diagnosis not present

## 2022-11-10 DIAGNOSIS — M545 Low back pain, unspecified: Secondary | ICD-10-CM | POA: Diagnosis not present

## 2022-11-10 DIAGNOSIS — G5601 Carpal tunnel syndrome, right upper limb: Secondary | ICD-10-CM | POA: Diagnosis not present

## 2022-11-10 DIAGNOSIS — R079 Chest pain, unspecified: Secondary | ICD-10-CM | POA: Diagnosis not present

## 2022-11-10 DIAGNOSIS — M1712 Unilateral primary osteoarthritis, left knee: Secondary | ICD-10-CM | POA: Diagnosis not present

## 2022-12-18 DIAGNOSIS — Z23 Encounter for immunization: Secondary | ICD-10-CM | POA: Diagnosis not present

## 2022-12-28 DIAGNOSIS — R059 Cough, unspecified: Secondary | ICD-10-CM | POA: Diagnosis not present

## 2022-12-28 DIAGNOSIS — Z20828 Contact with and (suspected) exposure to other viral communicable diseases: Secondary | ICD-10-CM | POA: Diagnosis not present

## 2023-01-12 ENCOUNTER — Other Ambulatory Visit: Payer: Self-pay

## 2023-01-12 ENCOUNTER — Encounter (HOSPITAL_COMMUNITY): Payer: Self-pay

## 2023-01-12 ENCOUNTER — Emergency Department (HOSPITAL_COMMUNITY): Payer: Medicare PPO

## 2023-01-12 ENCOUNTER — Emergency Department (HOSPITAL_COMMUNITY)
Admission: EM | Admit: 2023-01-12 | Discharge: 2023-01-12 | Disposition: A | Discharge status: Home / Self Care | Payer: Medicare PPO | Attending: Emergency Medicine | Admitting: Emergency Medicine

## 2023-01-12 DIAGNOSIS — I771 Stricture of artery: Secondary | ICD-10-CM | POA: Diagnosis not present

## 2023-01-12 DIAGNOSIS — I517 Cardiomegaly: Secondary | ICD-10-CM | POA: Diagnosis not present

## 2023-01-12 DIAGNOSIS — Z1152 Encounter for screening for COVID-19: Secondary | ICD-10-CM | POA: Diagnosis not present

## 2023-01-12 DIAGNOSIS — E119 Type 2 diabetes mellitus without complications: Secondary | ICD-10-CM | POA: Insufficient documentation

## 2023-01-12 DIAGNOSIS — R0602 Shortness of breath: Secondary | ICD-10-CM | POA: Diagnosis not present

## 2023-01-12 DIAGNOSIS — R531 Weakness: Secondary | ICD-10-CM | POA: Insufficient documentation

## 2023-01-12 DIAGNOSIS — Z7984 Long term (current) use of oral hypoglycemic drugs: Secondary | ICD-10-CM | POA: Diagnosis not present

## 2023-01-12 DIAGNOSIS — I10 Essential (primary) hypertension: Secondary | ICD-10-CM | POA: Diagnosis not present

## 2023-01-12 DIAGNOSIS — Z7982 Long term (current) use of aspirin: Secondary | ICD-10-CM | POA: Diagnosis not present

## 2023-01-12 DIAGNOSIS — I700 Atherosclerosis of aorta: Secondary | ICD-10-CM | POA: Diagnosis not present

## 2023-01-12 DIAGNOSIS — Z79899 Other long term (current) drug therapy: Secondary | ICD-10-CM | POA: Diagnosis not present

## 2023-01-12 LAB — COMPREHENSIVE METABOLIC PANEL
ALT: 19 U/L (ref 0–44)
AST: 26 U/L (ref 15–41)
Albumin: 4.1 g/dL (ref 3.5–5.0)
Alkaline Phosphatase: 57 U/L (ref 38–126)
Anion gap: 9 (ref 5–15)
BUN: 16 mg/dL (ref 8–23)
CO2: 29 mmol/L (ref 22–32)
Calcium: 9.2 mg/dL (ref 8.9–10.3)
Chloride: 102 mmol/L (ref 98–111)
Creatinine, Ser: 0.72 mg/dL (ref 0.44–1.00)
GFR, Estimated: >60 mL/min (ref >60)
Glucose, Bld: 90 mg/dL (ref 70–99)
Potassium: 4.2 mmol/L (ref 3.5–5.1)
Sodium: 140 mmol/L (ref 135–145)
Total Bilirubin: 0.6 mg/dL (ref 0.3–1.2)
Total Protein: 7.3 g/dL (ref 6.5–8.1)

## 2023-01-12 LAB — CBC WITH DIFFERENTIAL/PLATELET
Abs Immature Granulocytes: 0.01 10*3/uL (ref 0.00–0.07)
Basophils Absolute: 0 10*3/uL (ref 0.0–0.1)
Basophils Relative: 0 %
Eosinophils Absolute: 0.2 10*3/uL (ref 0.0–0.5)
Eosinophils Relative: 2 %
HCT: 39.8 % (ref 36.0–46.0)
Hemoglobin: 13 g/dL (ref 12.0–15.0)
Immature Granulocytes: 0 %
Lymphocytes Relative: 38 %
Lymphs Abs: 3.6 10*3/uL (ref 0.7–4.0)
MCH: 32.1 pg (ref 26.0–34.0)
MCHC: 32.7 g/dL (ref 30.0–36.0)
MCV: 98.3 fL (ref 80.0–100.0)
Monocytes Absolute: 0.5 10*3/uL (ref 0.1–1.0)
Monocytes Relative: 6 %
Neutro Abs: 5.1 10*3/uL (ref 1.7–7.7)
Neutrophils Relative %: 54 %
Platelets: 293 10*3/uL (ref 150–400)
RBC: 4.05 MIL/uL (ref 3.87–5.11)
RDW: 13.4 % (ref 11.5–15.5)
WBC: 9.4 10*3/uL (ref 4.0–10.5)
nRBC: 0 % (ref 0.0–0.2)

## 2023-01-12 LAB — RESP PANEL BY RT-PCR (RSV, FLU A&B, COVID)  RVPGX2
Influenza A by PCR: NEGATIVE
Influenza B by PCR: NEGATIVE
Resp Syncytial Virus by PCR: NEGATIVE
SARS Coronavirus 2 by RT PCR: NEGATIVE

## 2023-01-12 LAB — TROPONIN I (HIGH SENSITIVITY)
Troponin I (High Sensitivity): 3 ng/L (ref <18)
Troponin I (High Sensitivity): 3 ng/L (ref <18)

## 2023-01-12 LAB — BRAIN NATRIURETIC PEPTIDE: B Natriuretic Peptide: 35 pg/mL (ref 0.0–100.0)

## 2023-01-12 MED ORDER — AEROCHAMBER Z-STAT PLUS/MEDIUM MISC
Status: AC
  Administered 2023-01-12: 1
  Filled 2023-01-12: qty 1

## 2023-01-12 MED ORDER — AEROCHAMBER Z-STAT PLUS/MEDIUM MISC: 1.0000 | Freq: Once | Status: AC

## 2023-01-12 MED ORDER — ALBUTEROL SULFATE HFA 108 (90 BASE) MCG/ACT IN AERS
2.0000 | INHALATION_SPRAY | Freq: Once | RESPIRATORY_TRACT | Status: AC
  Administered 2023-01-12: 2 via RESPIRATORY_TRACT
  Filled 2023-01-12: qty 6.7

## 2023-01-12 MED ORDER — ALBUTEROL SULFATE HFA 108 (90 BASE) MCG/ACT IN AERS: 2.0000 | INHALATION_SPRAY | RESPIRATORY_TRACT | Status: DC | PRN

## 2023-01-12 NOTE — ED Provider Notes (Signed)
[place] [facility]ENC[facility] DEPARTMENT AT [facility] Provider Note   CSN: [id number] Arrival date & time: 01/12/23  1526     History  Chief Complaint  Patient presents with   Shortness of Breath    Gloria Sanchez is a 82 y.o. female.  HPI 82 year old female with a history of diabetes, hypertension, hyperlipidemia presents with shortness of breath and weakness.  She received a COVID and flu vaccine on September 20.  Since then she has had shortness of breath and generalized fatigue.  Has not gotten much better through the past few weeks.  She is also had a cough during this time.  Has not had any chest pain, focal weakness, leg swelling or recent surgery.  This afternoon at around 1 or 2 PM she was eating soup and acutely felt more short of breath and her son noted that she was short of breath over the phone.  Thus she was brought to the hospital.  Right now she is feeling a lot better.  Home Medications Prior to Admission medications   Medication Sig Start Date End Date Taking? Authorizing Provider  acetaminophen (TYLENOL) 500 MG tablet Take 500 mg by mouth every 6 (six) hours as needed for mild pain (pain score 1-3).   Yes [provider]  amLODipine (NORVASC) 5 MG tablet Take 1 tablet (5 mg total) by mouth daily. 05/18/18  Yes Sharee Holster, NP  aspirin EC 81 MG tablet Take 81 mg by mouth daily. Swallow whole.   Yes [provider]  benzonatate (TESSALON) 200 MG capsule Take 200 mg by mouth 3 (three) times daily as needed. 12/28/22  Yes [provider]  calcium carbonate (TUMS - DOSED IN MG ELEMENTAL CALCIUM) 500 MG chewable tablet Chew 1 tablet by mouth 3 (three) times daily as needed for indigestion or heartburn.   Yes [provider]  clonazePAM (KLONOPIN) 0.5 MG tablet Take 0.5 tablets (0.25 mg total) by mouth at bedtime as needed for anxiety. 05/18/18  Yes Sharee Holster, NP  gabapentin (NEURONTIN) 300 MG capsule Take 1 capsule (300  mg total) by mouth 3 (three) times daily. 07/17/19  Yes Vickki Hearing, MD  hydrochlorothiazide (HYDRODIURIL) 25 MG tablet Take 1 tablet (25 mg total) by mouth daily. Patient taking differently: Take 12.5 mg by mouth daily. 05/18/18  Yes Sharee Holster, NP  metFORMIN (GLUCOPHAGE-XR) 500 MG 24 hr tablet Take 1 tablet (500 mg total) by mouth at bedtime. 05/18/18  Yes Sharee Holster, NP  nitroGLYCERIN (NITROSTAT) 0.4 MG SL tablet Place 0.4 mg under the tongue every 5 (five) minutes as needed for chest pain.   Yes [provider]  pantoprazole (PROTONIX) 40 MG tablet Take 1 tablet (40 mg total) by mouth daily. 05/18/18  Yes Sharee Holster, NP  polyethylene glycol (MIRALAX / GLYCOLAX) 17 g packet Take 17 g by mouth every other day.   Yes [provider]  simvastatin (ZOCOR) 20 MG tablet Take 1 tablet (20 mg total) by mouth at bedtime. 05/18/18  Yes Sharee Holster, NP  tiZANidine (ZANAFLEX) 4 MG tablet Take 1 tablet (4 mg total) by mouth every 6 (six) hours as needed for muscle spasms. 03/16/22  Yes Vickki Hearing, MD  valsartan (DIOVAN) 320 MG tablet Take 320 mg by mouth daily. 03/30/20  Yes [provider]  NON FORMULARY Diet Type:  NAS 05/13/18   [provider]  sertraline (ZOLOFT) 50 MG tablet Take 50 mg by mouth  daily. Patient not taking: Reported on 01/12/2023 03/05/20   [provider]      Allergies    Patient has no known allergies.    Review of Systems   Review of Systems  Constitutional:  Positive for fatigue.  Respiratory:  Positive for cough and shortness of breath.   Cardiovascular:  Negative for chest pain.  Gastrointestinal:  Negative for abdominal pain.  Neurological:  Negative for weakness and numbness.    Physical Exam Updated Vital Signs BP 139/82   Pulse 81   Temp 99.5 F (37.5 C) (Oral)   Resp (!) 24   Ht 5\' 2"  (1.575 m)   Wt 83.9 kg   SpO2 94%   BMI 33.84 kg/m  Physical Exam Vitals and nursing note  reviewed.  Constitutional:      Appearance: She is well-developed.  HENT:     Head: Normocephalic and atraumatic.  Cardiovascular:     Rate and Rhythm: Normal rate and regular rhythm.     Heart sounds: Normal heart sounds.  Pulmonary:     Effort: Pulmonary effort is normal.     Breath sounds: Normal breath sounds.     Comments: Mild coarse breath sounds at bases Abdominal:     Palpations: Abdomen is soft.     Tenderness: There is no abdominal tenderness.  Musculoskeletal:     Right lower leg: No tenderness. No edema.     Left lower leg: No tenderness. No edema.  Skin:    General: Skin is warm and dry.  Neurological:     Mental Status: She is alert.     Comments: 5/5 strength in all 4 extremities.     ED Results / Procedures / Treatments   Labs (all labs ordered are listed, but only abnormal results are displayed) Labs Reviewed  RESP PANEL BY RT-PCR (RSV, FLU A&B, COVID)  [id number]  BRAIN NATRIURETIC PEPTIDE  CBC WITH DIFFERENTIAL/PLATELET  COMPREHENSIVE METABOLIC PANEL  TROPONIN I (HIGH SENSITIVITY)  TROPONIN I (HIGH SENSITIVITY)    EKG EKG Interpretation Date/Time:  Tuesday January 12 2023 15:43:19 EDT Ventricular Rate:  89 PR Interval:  214 QRS Duration:  94 QT Interval:  378 QTC Calculation: 459 R Axis:   -25  Text Interpretation: Sinus rhythm with 1st degree A-V block no acute ST/T changes similar to Aug 2024 Confirmed by Pricilla Loveless [phone]) on 01/12/2023 6:27:50 PM  Radiology DG Chest 2 View  Result Date: 01/12/2023 CLINICAL DATA:  82 year old female with shortness of breath. Recent vaccination for COVID-19. Cough. EXAM: CHEST - 2 VIEW COMPARISON:  Chest radiographs 11/07/2022 FINDINGS: PA and lateral views 1558 hours. Stable tortuosity of the thoracic aorta. Mild cardiomegaly. Stable low lung volumes compared to August. Visualized tracheal air column is within normal limits. No pneumothorax, pleural effusion, consolidation. Stable mildly coarse increased  interstitial markings in both lungs since August. No acute pulmonary opacity. Abdomen Calcified aortic atherosclerosis. Stable, nonobstructed bowel-gas pattern. Widespread advanced disc and endplate degeneration in the visible spine. No acute osseous abnormality identified. IMPRESSION: No change compared to August.  No acute cardiopulmonary abnormality. Electronically Signed   By: Odessa Fleming M.D.   On: 01/12/2023 18:24    Procedures Procedures    Medications Ordered in ED Medications  albuterol (VENTOLIN HFA) 108 (90 Base) MCG/ACT inhaler 2 puff (2 puffs Inhalation Given 01/12/23 2047)  aerochamber Z-Stat Plus/medium 1 each (1 each Other Given 01/12/23 2047)    ED Course/ Medical Decision Making/ A&P  Medical Decision Making Amount and/or Complexity of Data Reviewed Labs:     Details: Normal labs Radiology: ordered and independent interpretation performed.    Details: No CHF or pneumonia ECG/medicine tests: ordered and independent interpretation performed.    Details: No acute ischemia.  Risk Prescription drug management.   Patient presents with transiently worsening shortness of breath.  However discussing with patient she has had this for several weeks and when discussing with son she has actually had this for several months.  Right now, she is feeling a lot better.  She feels like she might of gotten some temporary relief with the albuterol inhaler.  Workup here is unremarkable.  Low suspicion for ACS, PE, occult pneumonia, etc.  At this point, unclear why she has had the symptoms for so long but I think following up with pulmonology would be reasonable, as she has already seen PCP and cardiology with no clear cause.  Will discharge home with return precautions.        Final Clinical Impression(s) / ED Diagnoses Final diagnoses:  Shortness of breath    Rx / DC Orders ED Discharge Orders     None         Pricilla Loveless, MD 01/12/23  2212

## 2023-01-12 NOTE — Discharge Instructions (Addendum)
You may use the albuterol inhaler 1-2 puffs every 6 hours as needed for cough, shortness of breath.  Follow-up with your primary care physician.  If you develop chest pain, shortness of breath, fever, or any other new/concerning symptoms, then return to the ER or call 911.

## 2023-01-12 NOTE — ED Triage Notes (Signed)
SOB x2 days 9/20 recd COVID vaccine and hadn't have energy since. Cough since vaccine.  Pt has labored breathing in triage  94% RA 28 RR Diminished B/L lower lobes

## 2023-01-15 DIAGNOSIS — Z6837 Body mass index (BMI) 37.0-37.9, adult: Secondary | ICD-10-CM | POA: Diagnosis not present

## 2023-01-15 DIAGNOSIS — R0600 Dyspnea, unspecified: Secondary | ICD-10-CM | POA: Diagnosis not present

## 2023-01-15 DIAGNOSIS — R0609 Other forms of dyspnea: Secondary | ICD-10-CM | POA: Diagnosis not present

## 2023-01-15 DIAGNOSIS — U099 Post covid-19 condition, unspecified: Secondary | ICD-10-CM | POA: Diagnosis not present

## 2023-01-15 DIAGNOSIS — I10 Essential (primary) hypertension: Secondary | ICD-10-CM | POA: Diagnosis not present

## 2023-01-15 DIAGNOSIS — R0902 Hypoxemia: Secondary | ICD-10-CM | POA: Diagnosis not present

## 2023-01-20 ENCOUNTER — Encounter: Payer: Self-pay | Admitting: Internal Medicine

## 2023-01-20 ENCOUNTER — Ambulatory Visit: Payer: Medicare PPO | Admitting: Internal Medicine

## 2023-01-20 VITALS — BP 124/76 | HR 100 | Ht 62.0 in | Wt 201.0 lb

## 2023-01-20 DIAGNOSIS — R058 Other specified cough: Secondary | ICD-10-CM | POA: Insufficient documentation

## 2023-01-20 DIAGNOSIS — R0609 Other forms of dyspnea: Secondary | ICD-10-CM | POA: Diagnosis not present

## 2023-01-20 MED ORDER — FAMOTIDINE 20 MG PO TABS: ORAL_TABLET | ORAL | 11 refills | Status: DC

## 2023-01-20 NOTE — Assessment & Plan Note (Signed)
Onset p knee surgery p 2020 esp severe since covid sept 2024  - 01/20/2023   Walked on RA  x  2  lap(s) =  approx 300  ft  @ slow/ cane pace, stopped due to sob and back pain with lowest 02 sats 93% but improved to 94% toward the end of last lap   Symptoms are markedly disproportionate to objective findings and not clear to what extent this is actually a pulmonary  problem but pt does appear to have difficult to sort out respiratory symptoms of unknown origin for which  DDX  = almost all start with A and  include Adherence, Ace Inhibitors, Acid Reflux, Active Sinus Disease, Alpha 1 Antitripsin deficiency, Anxiety masquerading as Airways dz,  ABPA,  Allergy(esp in young), Aspiration (esp in elderly), Adverse effects of meds,  Active smoking or Vaping, A bunch of PE's/clot burden (a few small clots can't cause this syndrome unless there is already severe underlying pulm or vascular dz with poor reserve),  Anemia or thyroid disorder, plus two Bs  = Bronchiectasis and Beta blocker use..and one C= CHF    Adherence is always the initial "prime suspect" and is a multilayered concern that requires a "trust but verify" approach in every patient - starting with knowing how to use medications, especially inhalers, correctly, keeping up with refills and understanding the fundamental difference between maintenance and prns vs those medications only taken for a very short course and then stopped and not refilled.  - return with all meds  ? Allergy/ asthma > on pred taper, hold off on trelegy for now and re -eval off trelegy but no pfts indicated at this point  ? Acid (or non-acid) GERD > always difficult to exclude as up to 75% of pts in some series report no assoc GI/ Heartburn symptoms> rec max (24h)  acid suppression and diet restrictions/ reviewed and instructions given in writing.   ? Anxiety/depression/ deconditioning assoc with wt gain  > usually at the bottom of this list of usual suspects but s  may interfere  with adherence and also interpretation of response or lack thereof to symptom management which can be quite subjective.   ? Adverse drug effects > none of the usual suspects  ? A bunch of PEs > D dimer  high normal value (seen commonly in the elderly or chronically ill)  may miss small peripheral pe, the clot burden with sob is moderately high and the d dimer  has a very high neg pred value if used in this setting.    Anemia/ thyroid disorder > needs TSH to complete the w/u   ? Chf/ neg w/u in er for IHD and bnp < 100 rules strongly against

## 2023-01-20 NOTE — Patient Instructions (Addendum)
Finish prednisone as you plan and hold off on the inhaler for now  Take delsym 2 tsp  (or mucinex dm 1200 ) every 12 hours and add tessalon 200 mg up to every 4-6 hours as needed to stop all coughing    Pantoprazole (protonix) 40 mg   Take  30-60 min before first meal of the day and Pepcid (famotidine)  20 mg after supper until return to office - this is the best way to tell whether stomach acid is contributing to your problem.    GERD (REFLUX)  is an extremely common cause of respiratory symptoms just like yours , many times with no obvious heartburn at all.    It can be treated with medication, but also with lifestyle changes including elevation of the head of your bed (ideally with 6 -8inch blocks or riskers under the headboard of your bed),  Smoking cessation, avoidance of late meals, excessive alcohol, and avoid fatty foods, chocolate, peppermint, colas, red wine, and acidic juices such as orange juice.  NO MINT OR MENTHOL PRODUCTS SO NO COUGH DROPS  USE SUGARLESS CANDY INSTEAD (Jolley ranchers or Stover's or Life Savers) or even ice chips will also do - the key is to swallow to prevent all throat clearing. NO OIL BASED VITAMINS - use powdered substitutes.  Avoid fish oil when coughing.    Please schedule a follow up office visit in 4 weeks, sooner if needed  with all medications /inhalers/ solutions in hand so we can verify exactly what you are taking. This includes all medications from all doctors and over the counters

## 2023-01-20 NOTE — Assessment & Plan Note (Addendum)
Max gerd rx/ cyclical cough rx with tessalon 200 01/20/2023 >>>   Upper airway cough syndrome (previously labeled PNDS),  is so named because it's frequently impossible to sort out how much is  CR/sinusitis with freq throat clearing (which can be related to primary GERD)   vs  causing  secondary (" extra esophageal")  GERD from wide swings in gastric pressure that occur with throat clearing, often  promoting self use of mint and menthol lozenges that reduce the lower esophageal sphincter tone and exacerbate the problem further in a cyclical fashion.   These are the same pts (now being labeled as having "irritable larynx syndrome" by some cough centers) who not infrequently have a history of having failed to tolerate ace inhibitors,  dry powder inhalers or biphosphonates or report having atypical/extraesophageal reflux symptoms that don't respond to standard doses of PPI  and are easily confused as having aecopd or asthma flares by even experienced allergists/ pulmonologists (myself included).   Each maintenance medication was reviewed in detail including emphasizing most importantly the difference between maintenance and prns and under what circumstances the prns are to be triggered using an action plan format where appropriate.  Total time for H and P, chart review, counseling,  directly observing portions of ambulatory 02 saturation study/ and generating customized AVS unique to this office visit / same day charting  > 60 min for multiple  refractory respiratory  symptoms of uncertain etiology

## 2023-01-20 NOTE — Progress Notes (Addendum)
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     Baseline wt around 200  and losing ground with doe x knee surgery 2020   History of Present Illness  01/20/2023  Pulmonary/ 1st office eval/ Sherene Sires / [facility] on prednisone 20 mg x 2  since 01/15/23  Chief Complaint  Patient presents with   Establish Care  Dyspnea:  room to room at home / [employer] walking pushing cart 01/16/23  Cough: wakes up with cough about 2 am > clear  Sleep: flat bed / one pillow 2 am choking  > drinking water helps / halls  SABA use: doesn't know how to use it  02: none   No obvious day to day or daytime pattern/variability or assoc excess/ purulent sputum or mucus plugs or hemoptysis or cp or chest tightness, subjective wheeze or overt sinus or hb symptoms.    Also denies any obvious fluctuation of symptoms with weather or environmental changes or other aggravating or alleviating factors except as outlined above   No unusual exposure hx or h/o childhood pna/ asthma or knowledge of premature birth.  Current Allergies, Complete Past Medical History, Past Surgical History, Family History, and Social History were reviewed in [employer] [facility] record.  ROS  The following are not active complaints unless bolded Hoarseness, sore throat/globus dysphagia, dental problems, itching, sneezing,  nasal congestion or discharge of excess mucus or purulent secretions, ear ache,   fever, chills, sweats, unintended wt loss or wt gain, classically pleuritic or exertional cp,  orthopnea pnd or arm/hand swelling  or leg swelling, presyncope, palpitations, abdominal pain, anorexia,  nausea, vomiting, diarrhea  or change in bowel habits or change in bladder habits, change in stools or change in urine, dysuria, hematuria,  rash, arthralgias, visual complaints, headache, numbness, weakness or ataxia or problems with walking/uses cane or coordination,  change in mood or  memory.              Outpatient Medications Prior to Visit  Medication Sig Dispense Refill   acetaminophen (TYLENOL) 500 MG tablet Take 500 mg by mouth every 6 (six) hours as needed for mild pain (pain score 1-3).     amLODipine (NORVASC) 5 MG tablet Take 1 tablet (5 mg total) by mouth daily. 30 tablet 0   aspirin EC 81 MG tablet Take 81 mg by mouth daily. Swallow whole.     benzonatate (TESSALON) 200 MG capsule Take 200 mg by mouth 3 (three) times daily as needed.     calcium carbonate (TUMS - DOSED IN MG ELEMENTAL CALCIUM) 500 MG chewable tablet Chew 1 tablet by mouth 3 (three) times daily as needed for indigestion or heartburn.     clonazePAM (KLONOPIN) 0.5 MG tablet Take 0.5 tablets (0.25 mg total) by mouth at bedtime as needed for anxiety. 5 tablet 0   gabapentin (NEURONTIN) 300 MG capsule Take 1 capsule (300 mg total) by mouth 3 (three) times daily. 90 capsule 1   hydrochlorothiazide (HYDRODIURIL) 25 MG tablet Take 1 tablet (25 mg total) by mouth daily. (Patient taking differently: Take 12.5 mg by mouth  daily.) 30 tablet 0   metFORMIN (GLUCOPHAGE-XR) 500 MG 24 hr tablet Take 1 tablet (500 mg total) by mouth at bedtime. 30 tablet 0   nitroGLYCERIN (NITROSTAT) 0.4 MG SL tablet Place 0.4 mg under the tongue every 5 (five) minutes as needed for chest pain.     NON FORMULARY Diet Type:  NAS     pantoprazole (PROTONIX) 40 MG tablet Take 1 tablet (40 mg total) by mouth daily. 30 tablet 0   polyethylene glycol (MIRALAX / GLYCOLAX) 17 g packet Take 17 g by mouth every other day.     predniSONE (DELTASONE) 20 MG tablet Take 20 mg by mouth 2 (two) times daily with a meal.     sertraline (ZOLOFT) 50 MG tablet Take  50 mg by mouth daily.     simvastatin (ZOCOR) 20 MG tablet Take 1 tablet (20 mg total) by mouth at bedtime. 30 tablet 0   tiZANidine (ZANAFLEX) 4 MG tablet Take 1 tablet (4 mg total) by mouth every 6 (six) hours as needed for muscle spasms. 30 tablet 0   valsartan (DIOVAN) 320 MG tablet Take 320 mg by mouth daily.     No facility-administered medications prior to visit.    Past Medical History:  Diagnosis Date   Allergic rhinitis    Arthritis    Common bile duct dilation 05/22/2016   Diverticulosis    DM (diabetes mellitus) (HCC)    Essential hypertension 05/22/2016   GERD (gastroesophageal reflux disease)    High cholesterol 05/22/2016   Hyperlipidemia    Hypertension    Restless leg syndrome       Objective:     BP 124/76   Pulse 100   Ht 5\' 2"  (1.575 m)   Wt 201 lb (91.2 kg)   SpO2 91%   BMI 36.76 kg/m   SpO2: 91 % RA   Amb somber mod obese (by bmi) bf nad / occ vigorous throat clearing   HEENT : Oropharynx  clear      Nasal turbinates nl    NECK :  without  apparent JVD/ palpable Nodes/TM    LUNGS: no acc muscle use,  Nl contour chest which is clear to A and P bilaterally without cough on insp or exp maneuvers   CV:  RRR  no s3 or murmur or increase in P2, and no edema   ABD: obese  soft and nontender with very poor insp excursion  MS:  slow but steady gait/cane assist/ ext warm without deformities Or obvious joint restrictions  calf tenderness, cyanosis or clubbing    SKIN: warm and dry without lesions    NEURO:  alert, approp, nl sensorium with  no motor or cerebellar deficits apparent.   .    I personally reviewed images and agree with radiology impression as follows:  CXR:   pa and lateral 01/12/23  No acute cardiopulmonary abnormality.  My review:  low lung vol, mild T kyphosis   Labs ordered/ reviewed:      Chemistry      Component Value Date/Time   NA 140 01/12/2023 1947   NA 144 12/07/2016 0952   K 4.2 01/12/2023 1947   CL 102  01/12/2023 1947   CO2 29 01/12/2023 1947   BUN 16 01/12/2023 1947   BUN 13 12/07/2016 0952   CREATININE 0.72 01/12/2023 1947      Component Value Date/Time   CALCIUM 9.2 01/12/2023 1947   ALKPHOS 57 01/12/2023 1947   AST 26 01/12/2023 1947  ALT 19 01/12/2023 1947   BILITOT 0.6 01/12/2023 1947        Lab Results  Component Value Date   WBC 9.4 01/12/2023   HGB 13.0 01/12/2023   HCT 39.8 01/12/2023   MCV 98.3 01/12/2023   PLT 293 01/12/2023     Lab Results  Component Value Date   DDIMER 0.75 (H) 11/07/2022       BNP    01/12/23   =  35     Assessment   DOE (dyspnea on exertion) Onset p knee surgery p 2020 esp severe since covid sept 2024  - 01/20/2023   Walked on RA  x  2  lap(s) =  approx 300  ft  @ slow/ cane pace, stopped due to sob and back pain with lowest 02 sats 93% but improved to 94% toward the end of last lap   Symptoms are markedly disproportionate to objective findings and not clear to what extent this is actually a pulmonary  problem but pt does appear to have difficult to sort out respiratory symptoms of unknown origin for which  DDX  = almost all start with A and  include Adherence, Ace Inhibitors, Acid Reflux, Active Sinus Disease, Alpha 1 Antitripsin deficiency, Anxiety masquerading as Airways dz,  ABPA,  Allergy(esp in young), Aspiration (esp in elderly), Adverse effects of meds,  Active smoking or Vaping, A bunch of PE's/clot burden (a few small clots can't cause this syndrome unless there is already severe underlying pulm or vascular dz with poor reserve),  Anemia or thyroid disorder, plus two Bs  = Bronchiectasis and Beta blocker use..and one C= CHF    Adherence is always the initial "prime suspect" and is a multilayered concern that requires a "trust but verify" approach in every patient - starting with knowing how to use medications, especially inhalers, correctly, keeping up with refills and understanding the fundamental difference between maintenance  and prns vs those medications only taken for a very short course and then stopped and not refilled.  - return with all meds  ? Allergy/ asthma > on pred taper, hold off on trelegy for now and re -eval off trelegy but no pfts indicated at this point  ? Acid (or non-acid) GERD > always difficult to exclude as up to 75% of pts in some series report no assoc GI/ Heartburn symptoms> rec max (24h)  acid suppression and diet restrictions/ reviewed and instructions given in writing.   ? Anxiety/depression/ deconditioning assoc with wt gain  > usually at the bottom of this list of usual suspects but s  may interfere with adherence and also interpretation of response or lack thereof to symptom management which can be quite subjective.   ? Adverse drug effects > none of the usual suspects  ? A bunch of PEs > D dimer  high normal value (seen commonly in the elderly or chronically ill)  may miss small peripheral pe, the clot burden with sob is moderately high and the d dimer  has a very high neg pred value if used in this setting.    Anemia/ thyroid disorder > needs TSH to complete the w/u   ? Chf/ neg w/u in er for IHD and bnp < 100 rules strongly against      Upper airway cough syndrome Max gerd rx/ cyclical cough rx with tessalon 200 01/20/2023 >>>   Upper airway cough syndrome (previously labeled PNDS),  is so named because it's frequently impossible to sort out how much  is  CR/sinusitis with freq throat clearing (which can be related to primary GERD)   vs  causing  secondary (" extra esophageal")  GERD from wide swings in gastric pressure that occur with throat clearing, often  promoting self use of mint and menthol lozenges that reduce the lower esophageal sphincter tone and exacerbate the problem further in a cyclical fashion.   These are the same pts (now being labeled as having "irritable larynx syndrome" by some cough centers) who not infrequently have a history of having failed to tolerate ace  inhibitors,  dry powder inhalers or biphosphonates or report having atypical/extraesophageal reflux symptoms that don't respond to standard doses of PPI  and are easily confused as having aecopd or asthma flares by even experienced allergists/ pulmonologists (myself included).   Each maintenance medication was reviewed in detail including emphasizing most importantly the difference between maintenance and prns and under what circumstances the prns are to be triggered using an action plan format where appropriate.  Total time for H and P, chart review, counseling,  directly observing portions of ambulatory 02 saturation study/ and generating customized AVS unique to this office visit / same day charting  > 60 min for multiple  refractory respiratory  symptoms of uncertain etiology                Sandrea Hughs, MD 01/20/2023

## 2023-02-02 ENCOUNTER — Institutional Professional Consult (permissible substitution): Payer: Medicare PPO | Admitting: Internal Medicine

## 2023-02-17 ENCOUNTER — Encounter: Payer: Self-pay | Admitting: Internal Medicine

## 2023-02-17 ENCOUNTER — Ambulatory Visit: Payer: Medicare PPO | Admitting: Internal Medicine

## 2023-02-17 VITALS — BP 136/75 | HR 100 | Ht 62.0 in | Wt 202.0 lb

## 2023-02-17 DIAGNOSIS — R0609 Other forms of dyspnea: Secondary | ICD-10-CM

## 2023-02-17 DIAGNOSIS — R058 Other specified cough: Secondary | ICD-10-CM | POA: Diagnosis not present

## 2023-02-17 NOTE — Progress Notes (Unsigned)
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    Worse overall since covid late Jan 2023    Baseline wt around 200  and losing ground with doe x knee surgery 2020   History of Present Illness  01/20/2023  Pulmonary/ 1st office eval/ [facility] / [facility] on prednisone 20 mg x 2  since 01/15/23  Chief Complaint  Patient presents with   Establish Care  Dyspnea:  room to room at home / [employer] walking pushing cart 01/16/23  Cough: wakes up with cough about 2 am > clear  Sleep: flat bed / one pillow 2 am choking  > drinking water helps / halls  SABA use: doesn't know how to use it  02: none  Rec Finish prednisone as you plan and hold off on the inhaler for now Take delsym 2 tsp  (or mucinex dm 1200 ) every 12 hours and add tessalon 200 mg up to every 4-6 hours as needed to stop all coughing  Pantoprazole (protonix) 40 mg   Take  30-60 min before first meal of the day and Pepcid (famotidine)  20 mg after supper until return to office - this is the best way to tell whether stomach acid is contributing to your problem.   GERD diet reviewed, bed blocks rec  Please schedule a follow up office visit in 4 weeks, sooner if needed  with all medications /inhalers/ solutions in hand     02/17/2023  f/u ov/[place] [facility] re: doe  maint on GERD Rx  / gabapentin 300 mg bid Chief Complaint  Patient presents with   Shortness of Breath  Dyspnea:  back to [employer] walking slow pace limited more by knee pain than sob  Cough: feels congested in throat but no excess mucus production  Sleeping: is flat / no pillow s   resp cc  SABA use: none  02:  none    No obvious day to day or daytime variability or assoc excess/ purulent sputum or mucus plugs or hemoptysis or cp or chest tightness, subjective wheeze or overt sinus or hb symptoms.    Also denies any obvious fluctuation of symptoms with weather or environmental changes or other aggravating or alleviating factors except as outlined above   No unusual exposure hx or h/o childhood pna/ asthma or knowledge of premature birth.  Current Allergies, Complete Past Medical History, Past Surgical History, Family History, and Social History were reviewed in [employer] [facility] record.  ROS  The following are not active complaints unless bolded Hoarseness, sore throat= globus, dysphagia, dental problems, itching, sneezing,  nasal congestion or discharge of excess mucus or purulent secretions, ear ache,   fever, chills, sweats, unintended wt loss or wt gain, classically pleuritic or exertional cp,  orthopnea pnd or arm/hand swelling  or leg swelling, presyncope, palpitations, abdominal pain, anorexia, nausea, vomiting, diarrhea  or change in bowel habits or change in bladder habits, change in stools or change in urine, dysuria, hematuria,  rash, arthralgias, visual complaints, headache, numbness, weakness or ataxia or problems with walking/uses cane  or  coordination,  change in mood or  memory.        Current Meds  Medication Sig   acetaminophen (TYLENOL) 500 MG tablet Take 500 mg by mouth every 6 (six) hours as needed for mild pain (pain score 1-3).   amLODipine (NORVASC) 5 MG tablet Take 1 tablet (5 mg total) by mouth daily.   aspirin EC 81 MG tablet Take 81 mg by mouth daily. Swallow whole.   benzonatate (TESSALON) 200 MG capsule Take 200 mg by mouth 3 (three) times daily as needed.   calcium carbonate (TUMS - DOSED IN MG ELEMENTAL CALCIUM) 500 MG chewable tablet Chew 1 tablet by mouth 3 (three) times daily as needed for indigestion or heartburn.   clonazePAM (KLONOPIN) 0.5 MG  tablet Take 0.5 tablets (0.25 mg total) by mouth at bedtime as needed for anxiety.   famotidine (PEPCID) 20 MG tablet One after supper   gabapentin (NEURONTIN) 300 MG capsule Take 1 capsule (300 mg total) by mouth 3 (three) times daily.   hydrochlorothiazide (HYDRODIURIL) 25 MG tablet Take 1 tablet (25 mg total) by mouth daily. (Patient taking differently: Take 12.5 mg by mouth daily.)   metFORMIN (GLUCOPHAGE-XR) 500 MG 24 hr tablet Take 1 tablet (500 mg total) by mouth at bedtime.   nitroGLYCERIN (NITROSTAT) 0.4 MG SL tablet Place 0.4 mg under the tongue every 5 (five) minutes as needed for chest pain.   NON FORMULARY Diet Type:  NAS   pantoprazole (PROTONIX) 40 MG tablet Take 1 tablet (40 mg total) by mouth daily.   polyethylene glycol (MIRALAX / GLYCOLAX) 17 g packet Take 17 g by mouth every other day.   simvastatin (ZOCOR) 20 MG tablet Take 1 tablet (20 mg total) by mouth at bedtime.   tiZANidine (ZANAFLEX) 4 MG tablet Take 1 tablet (4 mg total) by mouth every 6 (six) hours as needed for muscle spasms.   valsartan (DIOVAN) 320 MG tablet Take 320 mg by mouth daily.           Past Medical History:  Diagnosis Date   Allergic rhinitis    Arthritis    Common bile duct dilation 05/22/2016   Diverticulosis    DM (diabetes mellitus) (HCC)    Essential hypertension 05/22/2016   GERD (gastroesophageal reflux disease)    High cholesterol 05/22/2016   Hyperlipidemia    Hypertension    Restless leg syndrome       Objective:    Wts   02/17/2023     202   01/20/23 201 lb (91.2 kg)  01/12/23 185 lb (83.9 kg)  11/07/22 190 lb (86.2 kg)      Vital signs reviewed  02/17/2023  - Note at rest 02 sats  89% on RA   General appearance:    amb obese bf/ freq throat clearing    HEENT : Oropharynx  clear/ no pnd or cobblestoning       NECK :  without  apparent JVD/ palpable Nodes/TM    LUNGS: no acc muscle use,  Nl contour chest which is clear to A and P bilaterally without cough on insp  or exp maneuvers   CV:  RRR  no s3 or murmur or increase in P2, and no edema   ABD:  obese soft and nontender   MS: ext warm without deformities Or obvious joint restrictions  calf tenderness, cyanosis or clubbing    SKIN: warm and dry without lesions    NEURO:  alert, approp, nl sensorium with  no  motor or cerebellar deficits apparent.   .    I personally reviewed images and agree with radiology impression as follows:  CXR:   pa and lateral 01/12/23  No acute cardiopulmonary abnormality.  My review:  low lung vol, mild T kyphosis        Assessment

## 2023-02-17 NOTE — Patient Instructions (Signed)
Take delsym two tsp every 12 hours and supplement if needed with  tessalon 200mg  every 4 hours to suppress the urge to cough. Swallowing water and/or using ice chips/non mint and menthol containing candies (such as lifesavers or sugarless jolly ranchers) are also effective.  You should rest your voice and avoid activities that you know make you cough.  Once you have eliminated the cough for 3 straight days try reducing the tessalon 200 first,  then the delsym as tolerated.    Change gabapentin to 300 mg breakfast lunch and supper and if not better after a week add another at bedtime.  Please schedule a follow up office visit in 6 weeks, call sooner if needed

## 2023-02-18 NOTE — Assessment & Plan Note (Signed)
Onset p knee surgery p 2020 esp severe since covid sept 2024  - 01/20/2023   Walked on RA  x  2  lap(s) =  approx 300  ft  @ slow/ cane pace, stopped due to sob and back pain with lowest 02 sats 93% but improved to 94% toward the end of last lap  - 02/17/2023   Walked on RA  x  1  lap(s) =  approx 150  ft  @ slow/ cane pace, stopped due to knee pain with lowest 02 sats 95%   Sats actually improved with exertion c/w atx from obesity and inactivity > no further w/u for now

## 2023-02-18 NOTE — Assessment & Plan Note (Signed)
Onset p knee surgery 2020 -  Max gerd rx/ cyclical cough rx with tessalon 200 01/20/2023 >>>  improved but not eliminated 02/17/2023  -  02/17/2023 increased gabapentin to 300 mg tid x one week then to 300 mg qid if still throat clearing   Of the three most common causes of  Sub-acute / recurrent or chronic cough, only one (GERD)  can actually contribute to/ trigger  the other two (asthma and post nasal drip syndrome)  and perpetuate the cylce of cough.  While not intuitively obvious, many patients with chronic low grade reflux do not cough until there is a primary insult that disturbs the protective epithelial barrier and exposes sensitive nerve endings.   This is typically viral but can due to PNDS and  either may apply here.    >>> The point is that once this occurs, it is difficult to eliminate the cycle  using anything but a maximally effective acid suppression regimen at least in the short run, accompanied by an appropriate diet to address non acid GERD and control / eliminate the cough itself with gabapentin as maint and delsy/tessalon as prn x 6 week then regroup with all meds in hand using a trust but verify approach to confirm accurate Medication  Reconciliation The principal here is that until we are certain that the  patients are doing what we've asked, it makes no sense to ask them to do more.   Each maintenance medication was reviewed in detail including emphasizing most importantly the difference between maintenance and prns and under what circumstances the prns are to be triggered using an action plan format where appropriate.  Total time for H and P, chart review, counseling,  directly observing portions of ambulatory 02 saturation study/ and generating customized AVS unique to this office visit / same day charting = 30 min for multiple  refractory respiratory  symptoms of uncertain etiology

## 2023-03-08 DIAGNOSIS — N183 Chronic kidney disease, stage 3 unspecified: Secondary | ICD-10-CM | POA: Diagnosis not present

## 2023-03-08 DIAGNOSIS — E1140 Type 2 diabetes mellitus with diabetic neuropathy, unspecified: Secondary | ICD-10-CM | POA: Diagnosis not present

## 2023-03-08 DIAGNOSIS — E7849 Other hyperlipidemia: Secondary | ICD-10-CM | POA: Diagnosis not present

## 2023-03-08 DIAGNOSIS — K2100 Gastro-esophageal reflux disease with esophagitis, without bleeding: Secondary | ICD-10-CM | POA: Diagnosis not present

## 2023-03-11 DIAGNOSIS — E7849 Other hyperlipidemia: Secondary | ICD-10-CM | POA: Diagnosis not present

## 2023-03-11 DIAGNOSIS — R079 Chest pain, unspecified: Secondary | ICD-10-CM | POA: Diagnosis not present

## 2023-03-11 DIAGNOSIS — F329 Major depressive disorder, single episode, unspecified: Secondary | ICD-10-CM | POA: Diagnosis not present

## 2023-03-11 DIAGNOSIS — I10 Essential (primary) hypertension: Secondary | ICD-10-CM | POA: Diagnosis not present

## 2023-03-11 DIAGNOSIS — E1140 Type 2 diabetes mellitus with diabetic neuropathy, unspecified: Secondary | ICD-10-CM | POA: Diagnosis not present

## 2023-03-11 DIAGNOSIS — M1712 Unilateral primary osteoarthritis, left knee: Secondary | ICD-10-CM | POA: Diagnosis not present

## 2023-03-11 DIAGNOSIS — N8182 Incompetence or weakening of pubocervical tissue: Secondary | ICD-10-CM | POA: Diagnosis not present

## 2023-03-11 DIAGNOSIS — G2589 Other specified extrapyramidal and movement disorders: Secondary | ICD-10-CM | POA: Diagnosis not present

## 2023-03-11 DIAGNOSIS — G5601 Carpal tunnel syndrome, right upper limb: Secondary | ICD-10-CM | POA: Diagnosis not present

## 2023-03-29 ENCOUNTER — Institutional Professional Consult (permissible substitution): Payer: Medicare PPO | Admitting: Pulmonary Disease

## 2023-03-31 NOTE — Progress Notes (Signed)
 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    Worse overall since 1st covid late Jan 2023   Baseline wt around 200  and losing ground with doe x knee surgery 2020   History of Present Illness  01/20/2023  Pulmonary/ 1st office eval/ Gloria Sanchez / [facility] on prednisone 20 mg x 2  since 01/15/23  Chief Complaint  Patient presents with   Establish Care  Dyspnea:  room to room at home / [employer] walking pushing cart 01/16/23  Cough: wakes up with cough about 2 am > clear  Sleep: flat bed / one pillow 2 am choking  > drinking water  helps / halls  SABA use: doesn't know how to use it  02: none  Rec Finish prednisone as you plan and hold off on the inhaler for now Take delsym 2 tsp  (or mucinex dm 1200 ) every 12 hours and add tessalon 200 mg up to every 4-6 hours as needed to stop all coughing  Pantoprazole  (protonix ) 40 mg   Take  30-60 min before first meal of the day and Pepcid  (famotidine )  20 mg after supper until return to office - this is the best way to tell whether stomach acid is contributing to your problem.   GERD diet reviewed, bed blocks rec  Please schedule a follow up office visit in 4 weeks, sooner if needed  with all medications /inhalers/ solutions in hand     02/17/2023  f/u ov/[place] office/Gloria Sanchez re: doe  maint on GERD Rx  / gabapentin  300 mg bid Chief Complaint  Patient presents with   Shortness of Breath  Dyspnea:  back to [employer] walking slow pace limited more by knee pain than sob  Cough: feels congested in throat but no excess mucus production  Sleeping: is flat / no pillow s   resp cc  SABA use: none   02: none  Rec Take delsym two tsp every 12 hours and supplement if needed with  tessalon 200mg  every 4 hours to suppress the urge to cough.  Once you have eliminated the cough for 3 straight days try reducing the tessalon 200 first,  then the delsym as tolerated.   Change gabapentin  to 300 mg breakfast lunch and supper and if not better after a week add another at bedtime. Please schedule a follow up office visit in 6 weeks, call sooner if needed    04/02/2023  f/u ov/[place] office/Gloria Sanchez re: UACS/ doe s desats/ limited by knee pain maint on gabapentin  300 mg tid and no longer needing any cough meds   Chief Complaint  Patient presents with   Follow-up   Dyspnea:  worse since xmas to point of any walking other than room to room  at home  Cough: resolved  to her satisfaction  Sleeping: flat in bed no pillows s resp cc  SABA use: none  02: none      No obvious day to day or daytime variability or assoc excess/ purulent sputum or mucus plugs or hemoptysis or cp or chest tightness, subjective wheeze or overt  sinus or hb symptoms.    Also denies any obvious fluctuation of symptoms with weather or environmental changes or other aggravating or alleviating factors except as outlined above   No unusual exposure hx or h/o childhood pna/ asthma or knowledge of premature birth.  Current Allergies, Complete Past Medical History, Past Surgical History, Family History, and Social History were reviewed in [employer] record.  ROS  The following are not active complaints unless bolded Hoarseness, sore throat, dysphagia, dental problems, itching, sneezing,  nasal congestion or discharge of excess mucus or purulent secretions, ear ache,   fever, chills, sweats, unintended wt loss or wt gain, classically pleuritic or exertional cp,  orthopnea pnd or arm/hand swelling  or leg swelling, presyncope, palpitations, abdominal pain, anorexia, nausea, vomiting, diarrhea  or change in bowel  habits or change in bladder habits, change in stools or change in urine, dysuria, hematuria,  rash, arthralgias, visual complaints, headache, numbness, weakness or ataxia or problems with walking uses R sided cane  or coordination,  change in mood or  memory.        Current Meds  Medication Sig   acetaminophen  (TYLENOL ) 500 MG tablet Take 500 mg by mouth every 6 (six) hours as needed for mild pain (pain score 1-3).   amLODipine  (NORVASC ) 5 MG tablet Take 1 tablet (5 mg total) by mouth daily.   aspirin  EC 81 MG tablet Take 81 mg by mouth daily. Swallow whole.   benzonatate (TESSALON) 200 MG capsule Take 200 mg by mouth 3 (three) times daily as needed.   clonazePAM  (KLONOPIN ) 0.5 MG tablet Take 0.5 tablets (0.25 mg total) by mouth at bedtime as needed for anxiety.   famotidine  (PEPCID ) 20 MG tablet One after supper   gabapentin  (NEURONTIN ) 300 MG capsule Take 1 capsule (300 mg total) by mouth 3 (three) times daily.   hydrochlorothiazide  (HYDRODIURIL ) 25 MG tablet Take 1 tablet (25 mg total) by mouth daily. (Patient taking differently: Take 12.5 mg by mouth daily.)   metFORMIN  (GLUCOPHAGE -XR) 500 MG 24 hr tablet Take 1 tablet (500 mg total) by mouth at bedtime.   nitroGLYCERIN (NITROSTAT) 0.4 MG SL tablet Place 0.4 mg under the tongue every 5 (five) minutes as needed for chest pain.   NON FORMULARY Diet Type:  NAS   pantoprazole  (PROTONIX ) 40 MG tablet Take 1 tablet (40 mg total) by mouth daily.   polyethylene glycol (MIRALAX  / GLYCOLAX ) 17 g packet Take 17 g by mouth every other day.   simvastatin  (ZOCOR ) 20 MG tablet Take 1 tablet (20 mg total) by mouth at bedtime.   tiZANidine  (ZANAFLEX ) 4 MG tablet Take 1 tablet (4 mg total) by mouth every 6 (six) hours as needed for muscle spasms.   valsartan (DIOVAN) 320 MG tablet Take 320 mg by mouth daily.           Past Medical History:  Diagnosis Date   Allergic rhinitis    Arthritis    Common bile duct dilation 05/22/2016   Diverticulosis    DM  (diabetes mellitus) (HCC)    Essential hypertension 05/22/2016   GERD (gastroesophageal reflux disease)    High cholesterol 05/22/2016   Hyperlipidemia    Hypertension    Restless leg syndrome       Objective:    Wts  04/02/2023         201  02/17/2023     202   01/20/23 201 lb (91.2 kg)  01/12/23 185 lb (83.9 kg)  11/07/22 190  lb (86.2 kg)    Vital signs reviewed  04/02/2023  - Note at rest 02 sats  91% on RA up to 92% walking    General appearance:    amb pleasant bf, nad    HEENT : Oropharynx  clear      Nasal turbinates nl    NECK :  without  apparent JVD/ palpable Nodes/TM    LUNGS: no acc muscle use,  Nl contour chest which is clear to A and P bilaterally without cough on insp or exp maneuvers   CV:  RRR  no s3 or murmur or increase in P2, and no edema   ABD:  soft and nontender   MS:  Gait slow walk with cane/   ext warm without deformities Or obvious joint restrictions  calf tenderness, cyanosis or clubbing    SKIN: warm and dry without lesions    NEURO:  alert, approp, nl sensorium with  no motor or cerebellar deficits apparent.   .   CXR PA and Lateral:   04/02/2023 :    I personally reviewed images and impression is as follows:     Lung volumes slightly improved esp on lateral view/ no acute findings       Assessment

## 2023-04-02 ENCOUNTER — Encounter: Payer: Self-pay | Admitting: Internal Medicine

## 2023-04-02 ENCOUNTER — Ambulatory Visit: Payer: Medicare PPO | Admitting: Internal Medicine

## 2023-04-02 ENCOUNTER — Ambulatory Visit (HOSPITAL_COMMUNITY)
Admission: RE | Admit: 2023-04-02 | Discharge: 2023-04-02 | Disposition: A | Discharge status: Home / Self Care | Payer: Medicare PPO | Source: Ambulatory Visit | Attending: Internal Medicine | Admitting: Internal Medicine

## 2023-04-02 VITALS — BP 146/71 | HR 87 | Ht 62.0 in | Wt 201.6 lb

## 2023-04-02 DIAGNOSIS — R058 Other specified cough: Secondary | ICD-10-CM

## 2023-04-02 DIAGNOSIS — R0609 Other forms of dyspnea: Secondary | ICD-10-CM | POA: Diagnosis not present

## 2023-04-02 DIAGNOSIS — R0989 Other specified symptoms and signs involving the circulatory and respiratory systems: Secondary | ICD-10-CM | POA: Diagnosis not present

## 2023-04-02 NOTE — Patient Instructions (Addendum)
 Increase amlodipine  to 5 mg twice daily   Take it slow over the weekend and return here Monday AM for labs if not better  > go to ER if worse   Please remember to go to the  x-ray department  for your tests - we will call you with the results when they are available     Please schedule a follow up office visit in 4 weeks, sooner if needed  with all medications /inhalers/ solutions in hand so we can verify exactly what you are taking. This includes all medications from all doctors and over the counters

## 2023-04-03 ENCOUNTER — Encounter: Payer: Self-pay | Admitting: Internal Medicine

## 2023-04-03 NOTE — Assessment & Plan Note (Addendum)
 Onset p knee surgery 2020 -  Max gerd rx/ cyclical cough rx with tessalon 200 01/20/2023 >>>  improved but not eliminated 02/17/2023  -  02/17/2023 increased gabapentin  to 300 mg tid x one week then to 300 mg qid if still throat clearing   >>>    resolved on 300 mg tid as of 04/02/2023 > rec continue x 3 m  F/u 4 weeks with all meds in hand using a trust but verify approach to confirm accurate Medication  Reconciliation The principal here is that until we are certain that the  patients are doing what we've asked, it makes no sense to ask them to do more.   Each maintenance medication was reviewed in detail including emphasizing most importantly the difference between maintenance and prns and under what circumstances the prns are to be triggered using an action plan format where appropriate.  Total time for H and P, chart review, counseling,  directly observing portions of ambulatory 02 saturation study/ and generating customized AVS unique to this office visit / same day charting = 30 min for  respiratory  symptoms of uncertain etiology

## 2023-04-03 NOTE — Assessment & Plan Note (Signed)
 Onset p knee surgery p 2020 esp severe since covid sept 2024  - 01/20/2023   Walked on RA  x  2  lap(s) =  approx 300  ft  @ slow/ cane pace, stopped due to sob and back pain with lowest 02 sats 93% but improved to 94% toward the end of last lap  - 02/17/2023   Walked on RA  x  1  lap(s) =  approx 150  ft  @ slow/ cane pace, stopped due to knee pain with lowest 02 sats 95%  - 04/02/2023   Walked on RA  x  1  lap(s) =  approx 150  ft  @ slow/ cane  pace, stopped due to doe/ knee pain   with lowest 02 sats 92%   Now that cough has resolved, doe main concern with evidence of LVH on most recent echo and bp not optimally controlled but no evidence of overt chf/ PE /ILD / asthma > will try doubling amlodipine  and have her return on 1/16 when our lab will be open for doe lab profile if not doing better with bp rx

## 2023-04-14 DIAGNOSIS — R0609 Other forms of dyspnea: Secondary | ICD-10-CM | POA: Diagnosis not present

## 2023-04-15 LAB — CBC WITH DIFFERENTIAL/PLATELET
Basophils Absolute: 0.1 10*3/uL (ref 0.0–0.2)
Basos: 1 %
EOS (ABSOLUTE): 0.2 10*3/uL (ref 0.0–0.4)
Eos: 2 %
Hematocrit: 40.5 % (ref 34.0–46.6)
Hemoglobin: 13.7 g/dL (ref 11.1–15.9)
Immature Grans (Abs): 0 10*3/uL (ref 0.0–0.1)
Immature Granulocytes: 0 %
Lymphocytes Absolute: 3.4 10*3/uL — ABNORMAL HIGH (ref 0.7–3.1)
Lymphs: 37 %
MCH: 32.4 pg (ref 26.6–33.0)
MCHC: 33.8 g/dL (ref 31.5–35.7)
MCV: 96 fL (ref 79–97)
Monocytes Absolute: 0.5 10*3/uL (ref 0.1–0.9)
Monocytes: 6 %
Neutrophils Absolute: 4.9 10*3/uL (ref 1.4–7.0)
Neutrophils: 54 %
Platelets: 309 10*3/uL (ref 150–450)
RBC: 4.23 x10E6/uL (ref 3.77–5.28)
RDW: 12.1 % (ref 11.7–15.4)
WBC: 9 10*3/uL (ref 3.4–10.8)

## 2023-04-15 LAB — BRAIN NATRIURETIC PEPTIDE: BNP: 16.2 pg/mL (ref 0.0–100.0)

## 2023-04-15 LAB — BASIC METABOLIC PANEL
BUN/Creatinine Ratio: 15 (ref 12–28)
BUN: 12 mg/dL (ref 8–27)
CO2: 27 mmol/L (ref 20–29)
Calcium: 9.5 mg/dL (ref 8.7–10.3)
Chloride: 100 mmol/L (ref 96–106)
Creatinine, Ser: 0.78 mg/dL (ref 0.57–1.00)
Glucose: 119 mg/dL — ABNORMAL HIGH (ref 70–99)
Potassium: 3.9 mmol/L (ref 3.5–5.2)
Sodium: 142 mmol/L (ref 134–144)
eGFR: 77 mL/min/{1.73_m2} (ref >59)

## 2023-04-15 LAB — TSH: TSH: 0.784 u[IU]/mL (ref 0.450–4.500)

## 2023-04-15 LAB — D-DIMER, QUANTITATIVE: D-DIMER: 0.54 mg{FEU}/L — ABNORMAL HIGH (ref 0.00–0.49)

## 2023-04-19 ENCOUNTER — Telehealth: Payer: Self-pay

## 2023-04-19 NOTE — Telephone Encounter (Signed)
Tried to call patient vm was full

## 2023-04-20 NOTE — Telephone Encounter (Signed)
PT ret our call. Please try again. 

## 2023-04-20 NOTE — Telephone Encounter (Signed)
PT ret call when time.

## 2023-04-20 NOTE — Telephone Encounter (Signed)
I have tried turning this pt call multi times pt doesn't have a vm to lm

## 2023-04-22 NOTE — Telephone Encounter (Signed)
Spoke with patient regarding her results , noted this in result notes

## 2023-05-05 ENCOUNTER — Ambulatory Visit: Payer: Medicare PPO | Admitting: Internal Medicine

## 2023-05-12 ENCOUNTER — Telehealth: Payer: Self-pay | Admitting: Internal Medicine

## 2023-05-12 NOTE — Telephone Encounter (Signed)
Spoke with patient regarding the 10:30 am appointment with Dr. Sherene Sires on 06/01/23--provider wil not be in--rescheduled to Monday  06/07/23 at 10:15 am ---will mail information to patient and she voiced her understanding

## 2023-06-01 ENCOUNTER — Ambulatory Visit: Payer: Medicare PPO | Admitting: Internal Medicine

## 2023-06-06 NOTE — Progress Notes (Unsigned)
 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    Worse overall since 1st covid late Jan 2023   Baseline wt around 200  and losing ground with doe x knee surgery 2020   History of Present Illness  01/20/2023  Pulmonary/ 1st office eval/ Sherene Sires / [facility] on prednisone 20 mg x 2  since 01/15/23  Chief Complaint  Patient presents with   Establish Care  Dyspnea:  room to room at home / [employer] walking pushing cart 01/16/23  Cough: wakes up with cough about 2 am > clear  Sleep: flat bed / one pillow 2 am choking  > drinking water helps / halls  SABA use: doesn't know how to use it  02: none  Rec Finish prednisone as you plan and hold off on the inhaler for now Take delsym 2 tsp  (or mucinex dm 1200 ) every 12 hours and add tessalon 200 mg up to every 4-6 hours as needed to stop all coughing  Pantoprazole (protonix) 40 mg   Take  30-60 min before first meal of the day and Pepcid (famotidine)  20 mg after supper until return to office - this is the best way to tell whether stomach acid is contributing to your problem.   GERD diet reviewed, bed blocks rec  Please schedule a follow up office visit in 4 weeks, sooner if needed  with all medications /inhalers/ solutions in hand     02/17/2023  f/u ov/[place] office/Kennis Buell re: doe  maint on GERD Rx  / gabapentin 300 mg bid Chief Complaint  Patient presents with   Shortness of Breath  Dyspnea:  back to [employer] walking slow pace limited more by knee pain than sob  Cough: feels congested in throat but no excess mucus production  Sleeping: is flat / no pillow s   resp cc  SABA use: none   02: none  Rec Take delsym two tsp every 12 hours and supplement if needed with  tessalon 200mg  every 4 hours to suppress the urge to cough.  Once you have eliminated the cough for 3 straight days try reducing the tessalon 200 first,  then the delsym as tolerated.   Change gabapentin to 300 mg breakfast lunch and supper and if not better after a week add another at bedtime. Please schedule a follow up office visit in 6 weeks, call sooner if needed    04/02/2023  f/u ov/[place] office/Orvilla Truett re: UACS/ doe s desats/ limited by knee pain maint on gabapentin 300 mg tid and no longer needing any cough meds   Chief Complaint  Patient presents with   Follow-up   Dyspnea:  worse since xmas to point of any walking other than room to room  at home  Cough: resolved  to her satisfaction  Sleeping: flat in bed no pillows s resp cc  SABA use: none  02: none  Rec Increase amlodipine to 5 mg twice daily  Take it slow over the weekend and return here Monday AM for labs if not better  > cxr and labs nl  Please schedule a follow up office visit in 4 weeks, sooner if needed  with all medications /inhalers/ solutions in hand    06/07/2023  f/u ov/[place] office/Kerianna Rawlinson re: obesity/  maint on gabapentin 300 mg tid  did not  bring meds  Chief Complaint  Patient presents with   Follow-up    FOLLOW UP  cough and DOE    Dyspnea:  [employer] once a week leans on cart, does the whole store Cough: gone  Sleeping: bed is flat /no pillows s    resp cc  SABA use: none  02: none     No obvious day to day or daytime variability or assoc excess/ purulent sputum or mucus plugs or hemoptysis or cp or chest tightness, subjective wheeze or overt sinus or hb symptoms.    Also denies any obvious fluctuation of symptoms with weather or environmental changes or other aggravating or alleviating factors except as outlined above   No unusual exposure hx or h/o childhood pna/ asthma or knowledge of premature birth.  Current  Allergies, Complete Past Medical History, Past Surgical History, Family History, and Social History were reviewed in [employer] [facility] record.  ROS  The following are not active complaints unless bolded Hoarseness, sore throat, dysphagia, dental problems, itching, sneezing,  nasal congestion or discharge of excess mucus or purulent secretions, ear ache,   fever, chills, sweats, unintended wt loss or wt gain, classically pleuritic or exertional cp,  orthopnea pnd or arm/hand swelling  or leg swelling, presyncope, palpitations, abdominal pain, anorexia, nausea, vomiting, diarrhea  or change in bowel habits or change in bladder habits, change in stools or change in urine, dysuria, hematuria,  rash, arthralgias, visual complaints, headache, numbness, weakness or ataxia or problems with walking/uses cane or coordination,  change in mood or  memory.        Current Meds  Medication Sig   acetaminophen (TYLENOL) 500 MG tablet Take 500 mg by mouth every 6 (six) hours as needed for mild pain (pain score 1-3).   aspirin EC 81 MG tablet Take 81 mg by mouth daily. Swallow whole.   benzonatate (TESSALON) 200 MG capsule Take 200 mg by mouth 3 (three) times daily as needed.   calcium carbonate (TUMS - DOSED IN MG ELEMENTAL CALCIUM) 500 MG chewable tablet Chew 1 tablet by mouth 3 (three) times daily as needed for indigestion or heartburn.   clonazePAM (KLONOPIN) 0.5 MG tablet Take 0.5 tablets (0.25 mg total) by mouth at bedtime as needed for anxiety.   famotidine (PEPCID) 20 MG tablet One after supper   gabapentin (NEURONTIN) 300 MG capsule Take 1 capsule (300 mg total) by mouth 3 (three) times daily.   hydrochlorothiazide (HYDRODIURIL) 25 MG tablet Take 1 tablet (25 mg total) by mouth daily. (Patient taking differently: Take 12.5 mg by mouth daily.)   metFORMIN (GLUCOPHAGE-XR) 500 MG 24 hr tablet Take 1 tablet (500 mg total) by mouth at bedtime.   nitroGLYCERIN (NITROSTAT) 0.4 MG SL tablet Place  0.4 mg under the tongue every 5 (five) minutes as needed for chest pain.   NON FORMULARY Diet Type:  NAS   pantoprazole (PROTONIX) 40 MG tablet Take 1 tablet (40 mg total) by mouth daily.   polyethylene glycol (MIRALAX / GLYCOLAX) 17 g packet Take 17 g by mouth every other day.   simvastatin (ZOCOR) 20 MG tablet Take 1 tablet (20 mg total) by mouth at bedtime.   tiZANidine (ZANAFLEX) 4 MG tablet Take 1 tablet (4 mg total)  by mouth every 6 (six) hours as needed for muscle spasms.   valsartan (DIOVAN) 320 MG tablet Take 320 mg by mouth daily.   [DISCONTINUED] amLODipine (NORVASC) 5 MG tablet Take 1 tablet (5 mg total) by mouth daily.           Past Medical History:  Diagnosis Date   Allergic rhinitis    Arthritis    Common bile duct dilation 05/22/2016   Diverticulosis    DM (diabetes mellitus) (HCC)    Essential hypertension 05/22/2016   GERD (gastroesophageal reflux disease)    High cholesterol 05/22/2016   Hyperlipidemia    Hypertension    Restless leg syndrome       Objective:    Wts  06/07/2023       199  04/02/2023         201  02/17/2023     202   01/20/23 201 lb (91.2 kg)  01/12/23 185 lb (83.9 kg)  11/07/22 190 lb (86.2 kg)     Vital signs reviewed  06/07/2023  - Note  after walking across lobby to room   02 sats  90% on RA   General appearance:    pleasant amb bf walking with cane at normal pace/ nad   HEENT : Oropharynx  clear      Nasal turbinates nl    NECK :  without  apparent JVD/ palpable Nodes/TM    LUNGS: no acc muscle use,  Nl contour chest which is clear to A and P bilaterally without cough on insp or exp maneuvers   CV:  RRR  no s3 or murmur or increase in P2, and NO LE edema   ABD:  obese soft and nontender   MS:    ext warm without deformities Or obvious joint restrictions  calf tenderness, cyanosis or clubbing    SKIN: warm and dry without lesions    NEURO:  alert, approp, nl sensorium with  no motor or cerebellar deficits apparent.        Assessment

## 2023-06-07 ENCOUNTER — Encounter: Payer: Self-pay | Admitting: Internal Medicine

## 2023-06-07 ENCOUNTER — Ambulatory Visit: Payer: Medicare PPO | Admitting: Internal Medicine

## 2023-06-07 VITALS — BP 125/75 | HR 94 | Ht 62.0 in | Wt 199.0 lb

## 2023-06-07 DIAGNOSIS — R0609 Other forms of dyspnea: Secondary | ICD-10-CM

## 2023-06-07 DIAGNOSIS — R058 Other specified cough: Secondary | ICD-10-CM

## 2023-06-07 DIAGNOSIS — I10 Essential (primary) hypertension: Secondary | ICD-10-CM | POA: Diagnosis not present

## 2023-06-07 MED ORDER — AMLODIPINE BESYLATE 5 MG PO TABS: ORAL_TABLET | ORAL | 0 refills | Status: DC

## 2023-06-07 NOTE — Patient Instructions (Signed)
 No change in medications needed - let Dr Neita Carp know we had to increase your amlodipine 5 mg to twice daily   Be careful with salt while on this medication as may cause ankle/ leg swelling   If not able to get around [employer] due to your breathing, I need to see you back here.8   If you are satisfied with your treatment plan,  let your doctor know and he/she can either refill your medications or you can return here when your prescription runs out.     If in any way you are not 100% satisfied,  please tell us.  If 100% better, tell your friends!  Pulmonary follow up is as needed

## 2023-06-07 NOTE — Assessment & Plan Note (Signed)
 Onset p knee surgery p 2020 esp severe since covid sept 2024  - 01/20/2023   Walked on RA  x  2  lap(s) =  approx 300  ft  @ slow/ cane pace, stopped due to sob and back pain with lowest 02 sats 93% but improved to 94% toward the end of last lap  - 02/17/2023   Walked on RA  x  1  lap(s) =  approx 150  ft  @ slow/ cane pace, stopped due to knee pain with lowest 02 sats 95%  - 04/02/2023   Walked on RA  x  1  lap(s) =  approx 150  ft  @ slow/ cane  pace, stopped due to doe/ knee pain   with lowest 02 sats 92%    Labs and xrays reviewed from last ov and and are all unremarkable to main issue continues to be obesity/ DJD > deconditioning and no further pulmonary rx needed

## 2023-06-07 NOTE — Assessment & Plan Note (Addendum)
 Increased amlopdipien 5 mg to bid dosing 04/02/23 >>>  Adequate control on present rx, reviewed in detail with pt > no change in rx needed  > f/u PCP on new rx for amlodipine 5 mg bid   - advised to avoid salt and monitor for leg swelling    Each maintenance medication was reviewed in detail including emphasizing most importantly the difference between maintenance and prns and under what circumstances the prns are to be triggered using an action plan format where appropriate.  Total time for H and P, chart review, counseling with pt and son Fredrik Cove, reviewing use of pulse ox device(s) and generating customized AVS unique to this office visit / same day charting = 30 min summary final pulmonary eval.

## 2023-06-07 NOTE — Assessment & Plan Note (Signed)
 Onset p knee surgery 2020 -  Max gerd rx/ cyclical cough rx with tessalon 200 01/20/2023 >>>  improved but not eliminated 02/17/2023  -  02/17/2023 increased gabapentin to 300 mg tid x one week then to 300 mg qid if still throat clearing > resolved on 300 mg tid as of 04/02/2023 > rec continue x 3 m -  Allergy screen  04/02/23  >  Eos 0.2  - 06/07/2023 remains controlled with gabapentin 300 mg tid which is also the dose that helps her with chronic back pain per ortho> no additonal pulmonary w/u needed   F/u can be prn

## 2023-06-15 DIAGNOSIS — Z1231 Encounter for screening mammogram for malignant neoplasm of breast: Secondary | ICD-10-CM | POA: Diagnosis not present

## 2023-07-08 DIAGNOSIS — I10 Essential (primary) hypertension: Secondary | ICD-10-CM | POA: Diagnosis not present

## 2023-07-08 DIAGNOSIS — E7800 Pure hypercholesterolemia, unspecified: Secondary | ICD-10-CM | POA: Diagnosis not present

## 2023-07-08 DIAGNOSIS — E785 Hyperlipidemia, unspecified: Secondary | ICD-10-CM | POA: Diagnosis not present

## 2023-07-08 DIAGNOSIS — E1169 Type 2 diabetes mellitus with other specified complication: Secondary | ICD-10-CM | POA: Diagnosis not present

## 2023-07-23 DIAGNOSIS — I10 Essential (primary) hypertension: Secondary | ICD-10-CM | POA: Diagnosis not present

## 2023-07-23 DIAGNOSIS — Z1389 Encounter for screening for other disorder: Secondary | ICD-10-CM | POA: Diagnosis not present

## 2023-07-23 DIAGNOSIS — Z6836 Body mass index (BMI) 36.0-36.9, adult: Secondary | ICD-10-CM | POA: Diagnosis not present

## 2023-07-23 DIAGNOSIS — E1169 Type 2 diabetes mellitus with other specified complication: Secondary | ICD-10-CM | POA: Diagnosis not present

## 2023-07-23 DIAGNOSIS — Z1331 Encounter for screening for depression: Secondary | ICD-10-CM | POA: Diagnosis not present

## 2023-07-23 DIAGNOSIS — Z0000 Encounter for general adult medical examination without abnormal findings: Secondary | ICD-10-CM | POA: Diagnosis not present

## 2023-07-23 DIAGNOSIS — Z0001 Encounter for general adult medical examination with abnormal findings: Secondary | ICD-10-CM | POA: Diagnosis not present

## 2023-07-23 DIAGNOSIS — E785 Hyperlipidemia, unspecified: Secondary | ICD-10-CM | POA: Diagnosis not present

## 2023-07-27 LAB — AMB RESULTS CONSOLE CBG: Glucose: 101

## 2023-07-27 NOTE — [facility] Notes (Signed)
 SDOH not assessed

## 2023-08-02 ENCOUNTER — Encounter: Payer: Self-pay | Admitting: Orthopedic Surgery

## 2023-08-02 ENCOUNTER — Ambulatory Visit: Payer: Medicare PPO | Admitting: Orthopedic Surgery

## 2023-08-02 ENCOUNTER — Other Ambulatory Visit (INDEPENDENT_AMBULATORY_CARE_PROVIDER_SITE_OTHER): Payer: Self-pay

## 2023-08-02 ENCOUNTER — Other Ambulatory Visit: Payer: Self-pay

## 2023-08-02 DIAGNOSIS — M170 Bilateral primary osteoarthritis of knee: Secondary | ICD-10-CM

## 2023-08-02 DIAGNOSIS — M5416 Radiculopathy, lumbar region: Secondary | ICD-10-CM | POA: Diagnosis not present

## 2023-08-02 DIAGNOSIS — Z96651 Presence of right artificial knee joint: Secondary | ICD-10-CM | POA: Diagnosis not present

## 2023-08-02 DIAGNOSIS — Z96653 Presence of artificial knee joint, bilateral: Secondary | ICD-10-CM | POA: Diagnosis not present

## 2023-08-02 DIAGNOSIS — Z96652 Presence of left artificial knee joint: Secondary | ICD-10-CM

## 2023-08-02 DIAGNOSIS — M545 Low back pain, unspecified: Secondary | ICD-10-CM

## 2023-08-02 DIAGNOSIS — M5417 Radiculopathy, lumbosacral region: Secondary | ICD-10-CM

## 2023-08-02 NOTE — Patient Instructions (Addendum)
 Physical therapy with [facility] has been ordered you should hear from them later this week if not please call them [phone]

## 2023-08-02 NOTE — [facility] Notes (Signed)
 Chief Complaint  Patient presents with   Post-op Follow-up    Both knees are doing well s/p left knee replacement 05/10/18    Encounter Diagnoses  Name Primary?   S/P total knee replacement, left 05/10/18 Yes   Bilateral primary osteoarthritis of knee    Lumbosacral radiculopathy at L5    Lumbar radiculopathy    Lumbar pain    Problem list, medical hx, medications and allergies reviewed     Ms. scales comes in for her 2-year checkup on her knee replacements left 1 was done in 2020 right 1 was done prior to that  She has no problems with her knees no complaints she does use a cane but her main issue right now is ongoing back pain  She has been treated in the past with physical therapy and epidural injections her last MRI was in 2022 in June  Discussed this with her she is interested in repeating the injections if physical therapy does not work  We will send her for physical therapy come back in 6 weeks if no improvement repeat MRI and then do the injections  DG Knee AP/LAT W/Sun[place] Right Result Date: 08/02/2023 Imaging studies of the right and left knee Follow-up for total knees.  The left knee is a Sigma fixed-bearing posterior stabilizing knee of the right looks like it may be on osteonix or Stryker type knee Both knees appear to be in good position with no signs of loosening Impression stable postop appearance of the total knees   DG Knee AP/LAT W/Sunrise Left Result Date: 08/02/2023 Imaging studies of the right and left knee Follow-up for total knees.  The left knee is a Sigma fixed-bearing posterior stabilizing knee of the right looks like it may be on osteonix or Stryker type knee Both knees appear to be in good position with no signs of loosening Impression stable postop appearance of the total knees

## 2023-08-02 NOTE — [facility] Notes (Signed)
   There were no vitals taken for this visit.  There is no height or weight on file to calculate BMI.  Chief Complaint  Patient presents with   Post-op Follow-up    Both knees are doing well s/p left knee replacement 05/10/18    Encounter Diagnoses  Name Primary?   S/P total knee replacement, left 05/10/18 Yes   Bilateral primary osteoarthritis of knee    Lumbosacral radiculopathy at L5    Lumbar radiculopathy    Lumbar pain     DOI/DOS/ Date: ongoing  back pain/ knees ok   Unchanged

## 2023-08-11 ENCOUNTER — Telehealth: Payer: Self-pay | Admitting: Orthopedic Surgery

## 2023-08-11 NOTE — Telephone Encounter (Signed)
 Dr. Delfino Fellers pt - spoke w/Maddie w/[facility] ([facility]) [phone] option 2, she stated they never got the PT order.  Fax # [phone]

## 2023-08-11 NOTE — Telephone Encounter (Signed)
 I resent it [phone]

## 2023-08-19 DIAGNOSIS — M6281 Muscle weakness (generalized): Secondary | ICD-10-CM | POA: Diagnosis not present

## 2023-08-19 DIAGNOSIS — M545 Low back pain, unspecified: Secondary | ICD-10-CM | POA: Diagnosis not present

## 2023-08-19 DIAGNOSIS — R269 Unspecified abnormalities of gait and mobility: Secondary | ICD-10-CM | POA: Diagnosis not present

## 2023-08-25 DIAGNOSIS — M6281 Muscle weakness (generalized): Secondary | ICD-10-CM | POA: Diagnosis not present

## 2023-08-27 DIAGNOSIS — M6281 Muscle weakness (generalized): Secondary | ICD-10-CM | POA: Diagnosis not present

## 2023-08-31 DIAGNOSIS — M6281 Muscle weakness (generalized): Secondary | ICD-10-CM | POA: Diagnosis not present

## 2023-09-02 DIAGNOSIS — M6281 Muscle weakness (generalized): Secondary | ICD-10-CM | POA: Diagnosis not present

## 2023-09-03 ENCOUNTER — Other Ambulatory Visit: Payer: Self-pay | Admitting: Internal Medicine

## 2023-09-07 DIAGNOSIS — M6281 Muscle weakness (generalized): Secondary | ICD-10-CM | POA: Diagnosis not present

## 2023-09-08 DIAGNOSIS — I10 Essential (primary) hypertension: Secondary | ICD-10-CM | POA: Diagnosis not present

## 2023-09-08 DIAGNOSIS — R252 Cramp and spasm: Secondary | ICD-10-CM | POA: Diagnosis not present

## 2023-09-08 DIAGNOSIS — Z6836 Body mass index (BMI) 36.0-36.9, adult: Secondary | ICD-10-CM | POA: Diagnosis not present

## 2023-09-09 DIAGNOSIS — M6281 Muscle weakness (generalized): Secondary | ICD-10-CM | POA: Diagnosis not present

## 2023-09-13 ENCOUNTER — Ambulatory Visit: Admitting: Orthopedic Surgery

## 2023-09-13 DIAGNOSIS — R269 Unspecified abnormalities of gait and mobility: Secondary | ICD-10-CM | POA: Diagnosis not present

## 2023-09-13 DIAGNOSIS — M6281 Muscle weakness (generalized): Secondary | ICD-10-CM | POA: Diagnosis not present

## 2023-09-13 DIAGNOSIS — M545 Low back pain, unspecified: Secondary | ICD-10-CM | POA: Diagnosis not present

## 2023-09-15 DIAGNOSIS — M6281 Muscle weakness (generalized): Secondary | ICD-10-CM | POA: Diagnosis not present

## 2023-09-15 DIAGNOSIS — R269 Unspecified abnormalities of gait and mobility: Secondary | ICD-10-CM | POA: Diagnosis not present

## 2023-09-20 DIAGNOSIS — Z20828 Contact with and (suspected) exposure to other viral communicable diseases: Secondary | ICD-10-CM | POA: Diagnosis not present

## 2023-10-18 NOTE — Progress Notes (Signed)
 The patient attended a screening event on 07/24/2023, where her blood pressure was measured at 146/79 mmHg, and her non-fasting blood glucose was 101 mg/dl. During the event, the patient reported that he/she does/does not smoke, has [employer], is established with a primary care provider (Dr. Deward Soja), and pt did not complete SDOH screener.   A chart review confirmed that the patient does have an active PCP, Dr. Deward Soja - [facility]). The patient's most recent office visit was on 07/23/2023 and she does not have any upcoming appts at this time with her PCP. Chart review further indicates that the pt has a history of hypertension in her problem list and was addressed during her recent office visit with her provider.   [facility] called pt's PCP office to discuss screening event results and to obtain appt status. [facility]W asked PCP's office to see if the pt has any upcoming appts with her PCP and to share screening results. The pt's next upcoming appt will be on 11/16/23 for labs and on 11/23/23 with her PCP. Pt also has blood pressure identified in her problem list and was addressed during her 07/23/23 appt. CHW sent courtesy letter with bp education and Healthy Cooking class in case needed by pt.   At this time, no additional support from the [facility] [employer] [facility] is indicated.

## 2023-11-16 DIAGNOSIS — I10 Essential (primary) hypertension: Secondary | ICD-10-CM | POA: Diagnosis not present

## 2023-11-16 DIAGNOSIS — E1169 Type 2 diabetes mellitus with other specified complication: Secondary | ICD-10-CM | POA: Diagnosis not present

## 2023-11-16 DIAGNOSIS — Z0001 Encounter for general adult medical examination with abnormal findings: Secondary | ICD-10-CM | POA: Diagnosis not present

## 2023-11-16 DIAGNOSIS — E785 Hyperlipidemia, unspecified: Secondary | ICD-10-CM | POA: Diagnosis not present

## 2023-11-23 DIAGNOSIS — R252 Cramp and spasm: Secondary | ICD-10-CM | POA: Diagnosis not present

## 2023-11-23 DIAGNOSIS — Z0000 Encounter for general adult medical examination without abnormal findings: Secondary | ICD-10-CM | POA: Diagnosis not present

## 2023-11-23 DIAGNOSIS — E785 Hyperlipidemia, unspecified: Secondary | ICD-10-CM | POA: Diagnosis not present

## 2023-11-23 DIAGNOSIS — I10 Essential (primary) hypertension: Secondary | ICD-10-CM | POA: Diagnosis not present

## 2023-11-23 DIAGNOSIS — Z0001 Encounter for general adult medical examination with abnormal findings: Secondary | ICD-10-CM | POA: Diagnosis not present

## 2023-11-23 DIAGNOSIS — Z1331 Encounter for screening for depression: Secondary | ICD-10-CM | POA: Diagnosis not present

## 2023-11-23 DIAGNOSIS — Z6836 Body mass index (BMI) 36.0-36.9, adult: Secondary | ICD-10-CM | POA: Diagnosis not present

## 2023-11-23 DIAGNOSIS — Z1389 Encounter for screening for other disorder: Secondary | ICD-10-CM | POA: Diagnosis not present

## 2024-01-12 ENCOUNTER — Other Ambulatory Visit: Payer: Self-pay | Admitting: Internal Medicine

## 2024-01-12 NOTE — Telephone Encounter (Signed)
 No mention in LOV notes whether to continue or d/c Pepcid , please advise, thank you

## 2024-01-18 DIAGNOSIS — Z23 Encounter for immunization: Secondary | ICD-10-CM | POA: Diagnosis not present

## 2024-01-25 DIAGNOSIS — R0600 Dyspnea, unspecified: Secondary | ICD-10-CM | POA: Diagnosis not present

## 2024-01-25 DIAGNOSIS — E1169 Type 2 diabetes mellitus with other specified complication: Secondary | ICD-10-CM | POA: Diagnosis not present

## 2024-01-25 DIAGNOSIS — Z6836 Body mass index (BMI) 36.0-36.9, adult: Secondary | ICD-10-CM | POA: Diagnosis not present

## 2024-01-25 DIAGNOSIS — J019 Acute sinusitis, unspecified: Secondary | ICD-10-CM | POA: Diagnosis not present
